# Patient Record
Sex: Female | Born: 1965 | Race: White | Hispanic: No | Smoking: Current every day smoker
Health system: Southern US, Community
[De-identification: ages and names within clinical notes are randomized; demographics above are authoritative.]

## PROBLEM LIST (undated history)

## (undated) DIAGNOSIS — G43909 Migraine, unspecified, not intractable, without status migrainosus: Secondary | ICD-10-CM

## (undated) DIAGNOSIS — D619 Aplastic anemia, unspecified: Secondary | ICD-10-CM

## (undated) DIAGNOSIS — R7401 Elevation of levels of liver transaminase levels: Secondary | ICD-10-CM

## (undated) DIAGNOSIS — D649 Anemia, unspecified: Secondary | ICD-10-CM

## (undated) DIAGNOSIS — Z87442 Personal history of urinary calculi: Secondary | ICD-10-CM

## (undated) DIAGNOSIS — R51 Headache: Secondary | ICD-10-CM

## (undated) DIAGNOSIS — T8069XA Other serum reaction due to other serum, initial encounter: Secondary | ICD-10-CM

## (undated) DIAGNOSIS — I82409 Acute embolism and thrombosis of unspecified deep veins of unspecified lower extremity: Secondary | ICD-10-CM

## (undated) DIAGNOSIS — I10 Essential (primary) hypertension: Secondary | ICD-10-CM

## (undated) DIAGNOSIS — J45909 Unspecified asthma, uncomplicated: Secondary | ICD-10-CM

## (undated) DIAGNOSIS — K219 Gastro-esophageal reflux disease without esophagitis: Secondary | ICD-10-CM

## (undated) DIAGNOSIS — R519 Headache, unspecified: Secondary | ICD-10-CM

## (undated) DIAGNOSIS — R740 Nonspecific elevation of levels of transaminase and lactic acid dehydrogenase [LDH]: Secondary | ICD-10-CM

## (undated) DIAGNOSIS — J189 Pneumonia, unspecified organism: Secondary | ICD-10-CM

## (undated) DIAGNOSIS — F419 Anxiety disorder, unspecified: Secondary | ICD-10-CM

## (undated) DIAGNOSIS — Z9289 Personal history of other medical treatment: Secondary | ICD-10-CM

## (undated) DIAGNOSIS — F329 Major depressive disorder, single episode, unspecified: Secondary | ICD-10-CM

## (undated) DIAGNOSIS — R011 Cardiac murmur, unspecified: Secondary | ICD-10-CM

## (undated) DIAGNOSIS — M1990 Unspecified osteoarthritis, unspecified site: Secondary | ICD-10-CM

## (undated) DIAGNOSIS — F32A Depression, unspecified: Secondary | ICD-10-CM

## (undated) DIAGNOSIS — J449 Chronic obstructive pulmonary disease, unspecified: Secondary | ICD-10-CM

## (undated) HISTORY — PX: TUBAL LIGATION: SHX77

## (undated) HISTORY — PX: CYSTOSCOPY W/ URETEROSCOPY W/ LITHOTRIPSY: SUR380

## (undated) HISTORY — PX: TUNNELED VENOUS CATHETER PLACEMENT: SHX818

---
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
## 1998-06-04 ENCOUNTER — Inpatient Hospital Stay (HOSPITAL_COMMUNITY): Admission: EM | Admit: 1998-06-04 | Discharge: 1998-06-05 | Payer: Self-pay | Admitting: Emergency Medicine

## 1998-06-07 ENCOUNTER — Emergency Department (HOSPITAL_COMMUNITY): Admission: EM | Admit: 1998-06-07 | Discharge: 1998-06-07 | Payer: Self-pay | Admitting: Emergency Medicine

## 1998-06-09 ENCOUNTER — Ambulatory Visit (HOSPITAL_COMMUNITY): Admission: RE | Admit: 1998-06-09 | Discharge: 1998-06-09 | Payer: Self-pay | Admitting: Hematology & Oncology

## 1998-06-11 ENCOUNTER — Encounter: Admission: RE | Admit: 1998-06-11 | Discharge: 1998-09-09 | Payer: Self-pay | Admitting: Hematology & Oncology

## 1998-06-16 ENCOUNTER — Encounter: Admission: RE | Admit: 1998-06-16 | Discharge: 1998-06-16 | Payer: Self-pay | Admitting: Obstetrics & Gynecology

## 1998-07-07 ENCOUNTER — Encounter: Admission: RE | Admit: 1998-07-07 | Discharge: 1998-07-07 | Payer: Self-pay | Admitting: Obstetrics & Gynecology

## 1998-07-08 ENCOUNTER — Inpatient Hospital Stay (HOSPITAL_COMMUNITY): Admission: AD | Admit: 1998-07-08 | Discharge: 1998-07-08 | Payer: Self-pay | Admitting: Obstetrics

## 1998-07-13 ENCOUNTER — Inpatient Hospital Stay: Admission: RE | Admit: 1998-07-13 | Discharge: 1998-07-18 | Payer: Self-pay | Admitting: Hematology & Oncology

## 1998-07-18 ENCOUNTER — Emergency Department (HOSPITAL_COMMUNITY): Admission: EM | Admit: 1998-07-18 | Discharge: 1998-07-18 | Payer: Self-pay | Admitting: Emergency Medicine

## 1998-07-19 ENCOUNTER — Inpatient Hospital Stay (HOSPITAL_COMMUNITY): Admission: AD | Admit: 1998-07-19 | Discharge: 1998-07-23 | Payer: Self-pay | Admitting: Hematology & Oncology

## 1998-07-24 ENCOUNTER — Ambulatory Visit (HOSPITAL_BASED_OUTPATIENT_CLINIC_OR_DEPARTMENT_OTHER): Admission: RE | Admit: 1998-07-24 | Discharge: 1998-07-24 | Payer: Self-pay | Admitting: Surgery

## 1998-07-27 ENCOUNTER — Emergency Department (HOSPITAL_COMMUNITY): Admission: EM | Admit: 1998-07-27 | Discharge: 1998-07-27 | Payer: Self-pay | Admitting: Emergency Medicine

## 1998-07-30 ENCOUNTER — Ambulatory Visit (HOSPITAL_COMMUNITY): Admission: RE | Admit: 1998-07-30 | Discharge: 1998-07-30 | Payer: Self-pay | Admitting: Hematology & Oncology

## 1998-08-12 ENCOUNTER — Inpatient Hospital Stay (HOSPITAL_COMMUNITY): Admission: AD | Admit: 1998-08-12 | Discharge: 1998-08-12 | Payer: Self-pay | Admitting: Obstetrics

## 1998-08-17 ENCOUNTER — Emergency Department (HOSPITAL_COMMUNITY): Admission: EM | Admit: 1998-08-17 | Discharge: 1998-08-17 | Payer: Self-pay | Admitting: Emergency Medicine

## 1998-09-02 ENCOUNTER — Ambulatory Visit (HOSPITAL_COMMUNITY): Admission: RE | Admit: 1998-09-02 | Discharge: 1998-09-02 | Payer: Self-pay | Admitting: Hematology & Oncology

## 1998-12-28 ENCOUNTER — Inpatient Hospital Stay (HOSPITAL_COMMUNITY): Admission: AD | Admit: 1998-12-28 | Discharge: 1998-12-28 | Payer: Self-pay | Admitting: *Deleted

## 1999-02-09 ENCOUNTER — Encounter: Admission: RE | Admit: 1999-02-09 | Discharge: 1999-02-09 | Payer: Self-pay | Admitting: Obstetrics & Gynecology

## 1999-02-18 ENCOUNTER — Emergency Department (HOSPITAL_COMMUNITY): Admission: EM | Admit: 1999-02-18 | Discharge: 1999-02-18 | Payer: Self-pay | Admitting: Emergency Medicine

## 1999-02-18 ENCOUNTER — Encounter: Payer: Self-pay | Admitting: Emergency Medicine

## 1999-02-23 ENCOUNTER — Encounter: Admission: RE | Admit: 1999-02-23 | Discharge: 1999-02-23 | Payer: Self-pay | Admitting: Obstetrics & Gynecology

## 1999-03-02 ENCOUNTER — Emergency Department (HOSPITAL_COMMUNITY): Admission: EM | Admit: 1999-03-02 | Discharge: 1999-03-02 | Payer: Self-pay | Admitting: Emergency Medicine

## 1999-03-04 ENCOUNTER — Emergency Department (HOSPITAL_COMMUNITY): Admission: EM | Admit: 1999-03-04 | Discharge: 1999-03-04 | Payer: Self-pay | Admitting: Emergency Medicine

## 1999-06-09 ENCOUNTER — Ambulatory Visit (HOSPITAL_COMMUNITY): Admission: RE | Admit: 1999-06-09 | Discharge: 1999-06-09 | Payer: Self-pay | Admitting: Family Medicine

## 1999-06-09 ENCOUNTER — Encounter: Payer: Self-pay | Admitting: Family Medicine

## 1999-07-08 ENCOUNTER — Emergency Department (HOSPITAL_COMMUNITY): Admission: EM | Admit: 1999-07-08 | Discharge: 1999-07-08 | Payer: Self-pay | Admitting: Emergency Medicine

## 1999-09-20 ENCOUNTER — Encounter: Payer: Self-pay | Admitting: Hematology & Oncology

## 1999-09-20 ENCOUNTER — Ambulatory Visit (HOSPITAL_COMMUNITY): Admission: RE | Admit: 1999-09-20 | Discharge: 1999-09-20 | Payer: Self-pay | Admitting: Hematology & Oncology

## 1999-10-21 ENCOUNTER — Ambulatory Visit (HOSPITAL_COMMUNITY): Admission: RE | Admit: 1999-10-21 | Discharge: 1999-10-21 | Payer: Self-pay | Admitting: Family Medicine

## 1999-10-21 ENCOUNTER — Encounter: Payer: Self-pay | Admitting: Family Medicine

## 1999-10-25 ENCOUNTER — Encounter: Payer: Self-pay | Admitting: Emergency Medicine

## 1999-10-25 ENCOUNTER — Emergency Department (HOSPITAL_COMMUNITY): Admission: EM | Admit: 1999-10-25 | Discharge: 1999-10-25 | Payer: Self-pay | Admitting: Emergency Medicine

## 1999-10-26 ENCOUNTER — Emergency Department (HOSPITAL_COMMUNITY): Admission: EM | Admit: 1999-10-26 | Discharge: 1999-10-26 | Payer: Self-pay | Admitting: Emergency Medicine

## 1999-10-27 ENCOUNTER — Emergency Department (HOSPITAL_COMMUNITY): Admission: EM | Admit: 1999-10-27 | Discharge: 1999-10-27 | Payer: Self-pay

## 1999-12-23 ENCOUNTER — Ambulatory Visit (HOSPITAL_COMMUNITY): Admission: RE | Admit: 1999-12-23 | Discharge: 1999-12-23 | Payer: Self-pay | Admitting: Hematology & Oncology

## 1999-12-23 ENCOUNTER — Encounter (INDEPENDENT_AMBULATORY_CARE_PROVIDER_SITE_OTHER): Payer: Self-pay | Admitting: *Deleted

## 2000-01-21 ENCOUNTER — Emergency Department (HOSPITAL_COMMUNITY): Admission: EM | Admit: 2000-01-21 | Discharge: 2000-01-21 | Payer: Self-pay | Admitting: Emergency Medicine

## 2000-01-21 ENCOUNTER — Encounter: Payer: Self-pay | Admitting: Oncology

## 2000-04-21 ENCOUNTER — Encounter: Admission: RE | Admit: 2000-04-21 | Discharge: 2000-04-21 | Payer: Self-pay | Admitting: Obstetrics & Gynecology

## 2000-05-30 ENCOUNTER — Encounter (INDEPENDENT_AMBULATORY_CARE_PROVIDER_SITE_OTHER): Payer: Self-pay | Admitting: Specialist

## 2000-05-30 ENCOUNTER — Ambulatory Visit (HOSPITAL_COMMUNITY): Admission: RE | Admit: 2000-05-30 | Discharge: 2000-05-30 | Payer: Self-pay | Admitting: Hematology & Oncology

## 2000-06-26 ENCOUNTER — Encounter: Payer: Self-pay | Admitting: Hematology & Oncology

## 2000-06-27 ENCOUNTER — Inpatient Hospital Stay (HOSPITAL_COMMUNITY): Admission: RE | Admit: 2000-06-27 | Discharge: 2000-07-05 | Payer: Self-pay | Admitting: Hematology & Oncology

## 2000-06-29 ENCOUNTER — Encounter: Payer: Self-pay | Admitting: Hematology & Oncology

## 2000-06-30 ENCOUNTER — Encounter: Payer: Self-pay | Admitting: Hematology & Oncology

## 2000-07-01 ENCOUNTER — Encounter: Payer: Self-pay | Admitting: Hematology & Oncology

## 2000-07-03 ENCOUNTER — Encounter: Payer: Self-pay | Admitting: Hematology & Oncology

## 2000-07-31 ENCOUNTER — Encounter: Payer: Self-pay | Admitting: Hematology & Oncology

## 2000-07-31 ENCOUNTER — Ambulatory Visit (HOSPITAL_COMMUNITY): Admission: RE | Admit: 2000-07-31 | Discharge: 2000-07-31 | Payer: Self-pay | Admitting: Hematology & Oncology

## 2000-09-05 ENCOUNTER — Ambulatory Visit (HOSPITAL_COMMUNITY): Admission: RE | Admit: 2000-09-05 | Discharge: 2000-09-05 | Payer: Self-pay | Admitting: Hematology & Oncology

## 2000-09-12 ENCOUNTER — Ambulatory Visit (HOSPITAL_COMMUNITY): Admission: RE | Admit: 2000-09-12 | Discharge: 2000-09-12 | Payer: Self-pay | Admitting: Hematology & Oncology

## 2000-09-12 ENCOUNTER — Encounter (INDEPENDENT_AMBULATORY_CARE_PROVIDER_SITE_OTHER): Payer: Self-pay | Admitting: *Deleted

## 2000-09-13 ENCOUNTER — Encounter: Payer: Self-pay | Admitting: Emergency Medicine

## 2000-09-13 ENCOUNTER — Emergency Department (HOSPITAL_COMMUNITY): Admission: EM | Admit: 2000-09-13 | Discharge: 2000-09-13 | Payer: Self-pay | Admitting: Emergency Medicine

## 2000-10-15 ENCOUNTER — Ambulatory Visit (HOSPITAL_COMMUNITY): Admission: RE | Admit: 2000-10-15 | Discharge: 2000-10-15 | Payer: Self-pay | Admitting: Urology

## 2000-10-15 ENCOUNTER — Encounter: Payer: Self-pay | Admitting: Emergency Medicine

## 2000-11-12 ENCOUNTER — Emergency Department (HOSPITAL_COMMUNITY): Admission: EM | Admit: 2000-11-12 | Discharge: 2000-11-12 | Payer: Self-pay | Admitting: Emergency Medicine

## 2001-09-23 ENCOUNTER — Ambulatory Visit (HOSPITAL_COMMUNITY): Admission: RE | Admit: 2001-09-23 | Discharge: 2001-09-23 | Payer: Self-pay | Admitting: Hematology & Oncology

## 2001-09-23 ENCOUNTER — Encounter: Payer: Self-pay | Admitting: Hematology & Oncology

## 2001-12-19 HISTORY — PX: TOTAL ABDOMINAL HYSTERECTOMY: SHX209

## 2002-01-09 ENCOUNTER — Encounter: Payer: Self-pay | Admitting: Hematology & Oncology

## 2002-01-09 ENCOUNTER — Ambulatory Visit (HOSPITAL_COMMUNITY): Admission: RE | Admit: 2002-01-09 | Discharge: 2002-01-09 | Payer: Self-pay | Admitting: Hematology & Oncology

## 2002-02-12 ENCOUNTER — Ambulatory Visit (HOSPITAL_COMMUNITY): Admission: RE | Admit: 2002-02-12 | Discharge: 2002-02-12 | Payer: Self-pay | Admitting: Hematology & Oncology

## 2002-02-12 ENCOUNTER — Encounter (INDEPENDENT_AMBULATORY_CARE_PROVIDER_SITE_OTHER): Payer: Self-pay | Admitting: Specialist

## 2002-03-04 ENCOUNTER — Encounter: Payer: Self-pay | Admitting: Hematology & Oncology

## 2002-03-04 ENCOUNTER — Inpatient Hospital Stay (HOSPITAL_COMMUNITY): Admission: AD | Admit: 2002-03-04 | Discharge: 2002-03-13 | Payer: Self-pay | Admitting: Hematology & Oncology

## 2002-03-11 ENCOUNTER — Encounter: Payer: Self-pay | Admitting: Hematology & Oncology

## 2002-03-12 ENCOUNTER — Encounter: Payer: Self-pay | Admitting: Hematology & Oncology

## 2002-03-27 ENCOUNTER — Other Ambulatory Visit: Admission: RE | Admit: 2002-03-27 | Discharge: 2002-03-27 | Payer: Self-pay | Admitting: Obstetrics and Gynecology

## 2002-04-07 ENCOUNTER — Emergency Department (HOSPITAL_COMMUNITY): Admission: EM | Admit: 2002-04-07 | Discharge: 2002-04-07 | Payer: Self-pay | Admitting: Emergency Medicine

## 2002-04-07 ENCOUNTER — Encounter: Payer: Self-pay | Admitting: Emergency Medicine

## 2002-08-22 ENCOUNTER — Encounter (INDEPENDENT_AMBULATORY_CARE_PROVIDER_SITE_OTHER): Payer: Self-pay | Admitting: Specialist

## 2002-08-22 ENCOUNTER — Inpatient Hospital Stay (HOSPITAL_COMMUNITY): Admission: RE | Admit: 2002-08-22 | Discharge: 2002-08-24 | Payer: Self-pay | Admitting: Obstetrics and Gynecology

## 2003-06-04 ENCOUNTER — Encounter: Payer: Self-pay | Admitting: Emergency Medicine

## 2003-06-04 ENCOUNTER — Emergency Department (HOSPITAL_COMMUNITY): Admission: EM | Admit: 2003-06-04 | Discharge: 2003-06-04 | Payer: Self-pay | Admitting: Emergency Medicine

## 2003-07-28 ENCOUNTER — Encounter: Payer: Self-pay | Admitting: Family Medicine

## 2003-07-28 ENCOUNTER — Ambulatory Visit (HOSPITAL_COMMUNITY): Admission: RE | Admit: 2003-07-28 | Discharge: 2003-07-28 | Payer: Self-pay | Admitting: Family Medicine

## 2003-08-29 ENCOUNTER — Ambulatory Visit: Admission: RE | Admit: 2003-08-29 | Discharge: 2003-08-29 | Payer: Self-pay | Admitting: Family Medicine

## 2003-12-20 HISTORY — PX: MEDIAL PARTIAL KNEE REPLACEMENT: SHX5965

## 2004-01-06 ENCOUNTER — Inpatient Hospital Stay (HOSPITAL_COMMUNITY): Admission: RE | Admit: 2004-01-06 | Discharge: 2004-01-09 | Payer: Self-pay | Admitting: Orthopedic Surgery

## 2004-01-13 ENCOUNTER — Emergency Department (HOSPITAL_COMMUNITY): Admission: EM | Admit: 2004-01-13 | Discharge: 2004-01-13 | Payer: Self-pay | Admitting: Emergency Medicine

## 2004-08-25 ENCOUNTER — Ambulatory Visit (HOSPITAL_BASED_OUTPATIENT_CLINIC_OR_DEPARTMENT_OTHER): Admission: RE | Admit: 2004-08-25 | Discharge: 2004-08-25 | Payer: Self-pay | Admitting: Orthopedic Surgery

## 2004-10-21 ENCOUNTER — Ambulatory Visit: Payer: Self-pay | Admitting: Hematology & Oncology

## 2005-01-20 ENCOUNTER — Ambulatory Visit: Payer: Self-pay | Admitting: Hematology & Oncology

## 2005-05-19 ENCOUNTER — Ambulatory Visit: Payer: Self-pay | Admitting: Hematology & Oncology

## 2005-12-13 ENCOUNTER — Ambulatory Visit: Payer: Self-pay | Admitting: Hematology & Oncology

## 2006-01-23 ENCOUNTER — Ambulatory Visit (HOSPITAL_COMMUNITY): Admission: RE | Admit: 2006-01-23 | Discharge: 2006-01-23 | Payer: Self-pay | Admitting: Hematology & Oncology

## 2006-04-12 ENCOUNTER — Ambulatory Visit: Payer: Self-pay | Admitting: Hematology & Oncology

## 2006-04-14 LAB — CBC WITH DIFFERENTIAL/PLATELET
BASO%: 0.3 % (ref 0.0–2.0)
Eosinophils Absolute: 0.2 10*3/uL (ref 0.0–0.5)
LYMPH%: 24.7 % (ref 14.0–48.0)
MCHC: 34.2 g/dL (ref 32.0–36.0)
MONO#: 0.4 10*3/uL (ref 0.1–0.9)
NEUT#: 3.3 10*3/uL (ref 1.5–6.5)
Platelets: 222 10*3/uL (ref 145–400)
RBC: 4.48 10*6/uL (ref 3.70–5.32)
RDW: 13.2 % (ref 11.3–14.5)
WBC: 5.1 10*3/uL (ref 3.9–10.0)

## 2006-04-14 LAB — CHCC SMEAR

## 2006-05-09 ENCOUNTER — Ambulatory Visit: Payer: Self-pay | Admitting: Family Medicine

## 2006-08-04 ENCOUNTER — Ambulatory Visit: Payer: Self-pay | Admitting: Hematology & Oncology

## 2006-08-05 ENCOUNTER — Emergency Department (HOSPITAL_COMMUNITY): Admission: EM | Admit: 2006-08-05 | Discharge: 2006-08-06 | Payer: Self-pay | Admitting: Emergency Medicine

## 2006-08-11 LAB — CBC WITH DIFFERENTIAL/PLATELET
Basophils Absolute: 0 10*3/uL (ref 0.0–0.1)
EOS%: 3 % (ref 0.0–7.0)
Eosinophils Absolute: 0.2 10*3/uL (ref 0.0–0.5)
HCT: 42.7 % (ref 34.8–46.6)
HGB: 14.6 g/dL (ref 11.6–15.9)
MCH: 34.3 pg — ABNORMAL HIGH (ref 26.0–34.0)
MONO#: 0.5 10*3/uL (ref 0.1–0.9)
NEUT#: 3 10*3/uL (ref 1.5–6.5)
NEUT%: 58.1 % (ref 39.6–76.8)
RDW: 14.4 % (ref 11.3–14.5)
WBC: 5.2 10*3/uL (ref 3.9–10.0)
lymph#: 1.5 10*3/uL (ref 0.9–3.3)

## 2006-08-11 LAB — CHCC SMEAR

## 2006-11-07 ENCOUNTER — Ambulatory Visit: Payer: Self-pay | Admitting: Hematology & Oncology

## 2006-11-07 ENCOUNTER — Emergency Department (HOSPITAL_COMMUNITY): Admission: EM | Admit: 2006-11-07 | Discharge: 2006-11-07 | Payer: Self-pay | Admitting: Family Medicine

## 2006-11-20 LAB — CBC WITH DIFFERENTIAL/PLATELET
BASO%: 1.6 % (ref 0.0–2.0)
Eosinophils Absolute: 0.1 10*3/uL (ref 0.0–0.5)
HCT: 41.4 % (ref 34.8–46.6)
HGB: 14.6 g/dL (ref 11.6–15.9)
MCH: 34.3 pg — ABNORMAL HIGH (ref 26.0–34.0)
MCV: 96.9 fL (ref 81.0–101.0)
MONO#: 0.6 10*3/uL (ref 0.1–0.9)
RDW: 11.5 % (ref 11.3–14.5)
lymph#: 1.7 10*3/uL (ref 0.9–3.3)

## 2006-11-20 LAB — CHCC SMEAR

## 2007-03-14 ENCOUNTER — Ambulatory Visit: Payer: Self-pay | Admitting: Hematology & Oncology

## 2007-03-19 LAB — CBC WITH DIFFERENTIAL/PLATELET
BASO%: 0.5 % (ref 0.0–2.0)
Basophils Absolute: 0 10*3/uL (ref 0.0–0.1)
EOS%: 3.8 % (ref 0.0–7.0)
HGB: 14.8 g/dL (ref 11.6–15.9)
MCH: 34.5 pg — ABNORMAL HIGH (ref 26.0–34.0)
MCHC: 34.8 g/dL (ref 32.0–36.0)
MONO#: 0.4 10*3/uL (ref 0.1–0.9)
RDW: 13.5 % (ref 11.3–14.5)
WBC: 5.2 10*3/uL (ref 3.9–10.0)
lymph#: 1.2 10*3/uL (ref 0.9–3.3)

## 2007-05-08 ENCOUNTER — Inpatient Hospital Stay (HOSPITAL_COMMUNITY): Admission: EM | Admit: 2007-05-08 | Discharge: 2007-05-09 | Payer: Self-pay | Admitting: Emergency Medicine

## 2007-05-08 ENCOUNTER — Ambulatory Visit: Payer: Self-pay | Admitting: Cardiology

## 2007-05-08 ENCOUNTER — Ambulatory Visit: Payer: Self-pay | Admitting: *Deleted

## 2007-05-18 ENCOUNTER — Ambulatory Visit: Payer: Self-pay | Admitting: Family Medicine

## 2007-05-28 ENCOUNTER — Emergency Department (HOSPITAL_COMMUNITY): Admission: EM | Admit: 2007-05-28 | Discharge: 2007-05-28 | Payer: Self-pay | Admitting: Emergency Medicine

## 2007-07-10 ENCOUNTER — Ambulatory Visit: Payer: Self-pay | Admitting: Hematology & Oncology

## 2007-07-13 LAB — CBC WITH DIFFERENTIAL/PLATELET
Eosinophils Absolute: 0.2 10*3/uL (ref 0.0–0.5)
HGB: 14.9 g/dL (ref 11.6–15.9)
MONO#: 0.5 10*3/uL (ref 0.1–0.9)
NEUT#: 2.7 10*3/uL (ref 1.5–6.5)
RBC: 4.27 10*6/uL (ref 3.70–5.32)
RDW: 13.5 % (ref 11.3–14.5)
WBC: 5.1 10*3/uL (ref 3.9–10.0)
lymph#: 1.6 10*3/uL (ref 0.9–3.3)

## 2007-07-18 ENCOUNTER — Ambulatory Visit (HOSPITAL_BASED_OUTPATIENT_CLINIC_OR_DEPARTMENT_OTHER): Admission: RE | Admit: 2007-07-18 | Discharge: 2007-07-18 | Payer: Self-pay | Admitting: Orthopedic Surgery

## 2007-12-24 ENCOUNTER — Ambulatory Visit: Payer: Self-pay | Admitting: Family Medicine

## 2007-12-31 ENCOUNTER — Ambulatory Visit (HOSPITAL_COMMUNITY): Admission: RE | Admit: 2007-12-31 | Discharge: 2007-12-31 | Payer: Self-pay | Admitting: Family Medicine

## 2008-01-02 ENCOUNTER — Ambulatory Visit: Payer: Self-pay | Admitting: Hematology & Oncology

## 2008-02-18 ENCOUNTER — Ambulatory Visit: Payer: Self-pay | Admitting: Hematology & Oncology

## 2008-02-20 LAB — CBC WITH DIFFERENTIAL/PLATELET
BASO%: 0.6 % (ref 0.0–2.0)
HCT: 45.9 % (ref 34.8–46.6)
LYMPH%: 38.5 % (ref 14.0–48.0)
MCHC: 34.4 g/dL (ref 32.0–36.0)
MONO#: 0.6 10*3/uL (ref 0.1–0.9)
NEUT%: 44.8 % (ref 39.6–76.8)
Platelets: 175 10*3/uL (ref 145–400)
WBC: 5.5 10*3/uL (ref 3.9–10.0)

## 2008-08-12 ENCOUNTER — Ambulatory Visit: Payer: Self-pay | Admitting: Hematology & Oncology

## 2008-08-18 ENCOUNTER — Ambulatory Visit (HOSPITAL_BASED_OUTPATIENT_CLINIC_OR_DEPARTMENT_OTHER): Admission: RE | Admit: 2008-08-18 | Discharge: 2008-08-18 | Payer: Self-pay | Admitting: Hematology & Oncology

## 2008-08-18 LAB — CBC WITH DIFFERENTIAL (CANCER CENTER ONLY)
BASO%: 0.7 % (ref 0.0–2.0)
Eosinophils Absolute: 0.2 10*3/uL (ref 0.0–0.5)
MONO#: 0.4 10*3/uL (ref 0.1–0.9)
MONO%: 8 % (ref 0.0–13.0)
NEUT#: 2.8 10*3/uL (ref 1.5–6.5)
Platelets: 163 10*3/uL (ref 145–400)
RBC: 4.23 10*6/uL (ref 3.70–5.32)
RDW: 10.5 % (ref 10.5–14.6)
WBC: 4.8 10*3/uL (ref 3.9–10.0)

## 2008-08-18 LAB — COMPREHENSIVE METABOLIC PANEL
Albumin: 4.5 g/dL (ref 3.5–5.2)
CO2: 25 mEq/L (ref 19–32)
Chloride: 103 mEq/L (ref 96–112)
Glucose, Bld: 83 mg/dL (ref 70–99)
Potassium: 4.2 mEq/L (ref 3.5–5.3)
Sodium: 138 mEq/L (ref 135–145)
Total Protein: 7.2 g/dL (ref 6.0–8.3)

## 2008-08-18 LAB — RETICULOCYTES (CHCC)
RBC.: 4.32 MIL/uL (ref 3.87–5.11)
Retic Ct Pct: 1.1 % (ref 0.4–3.1)

## 2009-02-04 ENCOUNTER — Observation Stay (HOSPITAL_COMMUNITY): Admission: EM | Admit: 2009-02-04 | Discharge: 2009-02-05 | Payer: Self-pay | Admitting: Emergency Medicine

## 2009-03-11 ENCOUNTER — Ambulatory Visit: Payer: Self-pay | Admitting: Hematology & Oncology

## 2009-03-12 LAB — CBC WITH DIFFERENTIAL (CANCER CENTER ONLY)
EOS%: 4.2 % (ref 0.0–7.0)
LYMPH%: 26.5 % (ref 14.0–48.0)
MCH: 33.7 pg (ref 26.0–34.0)
MCHC: 32.7 g/dL (ref 32.0–36.0)
MONO%: 6.5 % (ref 0.0–13.0)
NEUT#: 3.4 10*3/uL (ref 1.5–6.5)
Platelets: 200 10*3/uL (ref 145–400)
RDW: 11.3 % (ref 10.5–14.6)

## 2009-03-12 LAB — RETICULOCYTES (CHCC): ABS Retic: 50.9 10*3/uL (ref 19.0–186.0)

## 2009-08-27 ENCOUNTER — Ambulatory Visit: Payer: Self-pay | Admitting: Hematology & Oncology

## 2009-09-18 LAB — CBC WITH DIFFERENTIAL (CANCER CENTER ONLY)
BASO#: 0 10*3/uL (ref 0.0–0.2)
EOS%: 4.1 % (ref 0.0–7.0)
HGB: 15.3 g/dL (ref 11.6–15.9)
LYMPH%: 21.6 % (ref 14.0–48.0)
MCH: 35 pg — ABNORMAL HIGH (ref 26.0–34.0)
MCHC: 34.2 g/dL (ref 32.0–36.0)
MCV: 102 fL — ABNORMAL HIGH (ref 81–101)
MONO%: 7.3 % (ref 0.0–13.0)
NEUT#: 4 10*3/uL (ref 1.5–6.5)
NEUT%: 66.3 % (ref 39.6–80.0)

## 2009-09-18 LAB — CHCC SATELLITE - SMEAR

## 2010-04-06 ENCOUNTER — Ambulatory Visit: Payer: Self-pay | Admitting: Hematology & Oncology

## 2010-04-19 LAB — COMPREHENSIVE METABOLIC PANEL
Alkaline Phosphatase: 72 U/L (ref 39–117)
BUN: 6 mg/dL (ref 6–23)
CO2: 21 mEq/L (ref 19–32)
Creatinine, Ser: 0.64 mg/dL (ref 0.40–1.20)
Glucose, Bld: 71 mg/dL (ref 70–99)
Sodium: 139 mEq/L (ref 135–145)
Total Bilirubin: 0.5 mg/dL (ref 0.3–1.2)
Total Protein: 7.2 g/dL (ref 6.0–8.3)

## 2010-04-19 LAB — CBC WITH DIFFERENTIAL (CANCER CENTER ONLY)
BASO#: 0.1 10*3/uL (ref 0.0–0.2)
EOS%: 2.3 % (ref 0.0–7.0)
Eosinophils Absolute: 0.2 10*3/uL (ref 0.0–0.5)
HGB: 16 g/dL — ABNORMAL HIGH (ref 11.6–15.9)
LYMPH%: 17.6 % (ref 14.0–48.0)
MCH: 34.4 pg — ABNORMAL HIGH (ref 26.0–34.0)
MCHC: 33.3 g/dL (ref 32.0–36.0)
MCV: 103 fL — ABNORMAL HIGH (ref 81–101)
MONO%: 6.6 % (ref 0.0–13.0)
NEUT#: 4.5 10*3/uL (ref 1.5–6.5)

## 2010-04-19 LAB — VITAMIN D 25 HYDROXY (VIT D DEFICIENCY, FRACTURES): Vit D, 25-Hydroxy: 23 ng/mL — ABNORMAL LOW (ref 30–89)

## 2010-06-02 ENCOUNTER — Ambulatory Visit: Payer: Self-pay | Admitting: Hematology & Oncology

## 2010-07-23 ENCOUNTER — Ambulatory Visit: Payer: Self-pay | Admitting: Hematology & Oncology

## 2010-07-26 LAB — CBC WITH DIFFERENTIAL (CANCER CENTER ONLY)
BASO#: 0.1 10*3/uL (ref 0.0–0.2)
BASO%: 1.1 % (ref 0.0–2.0)
EOS%: 4.2 % (ref 0.0–7.0)
Eosinophils Absolute: 0.3 10*3/uL (ref 0.0–0.5)
HCT: 44.8 % (ref 34.8–46.6)
HGB: 15.4 g/dL (ref 11.6–15.9)
LYMPH#: 1.6 10*3/uL (ref 0.9–3.3)
LYMPH%: 26.7 % (ref 14.0–48.0)
MCH: 35.3 pg — ABNORMAL HIGH (ref 26.0–34.0)
MCHC: 34.3 g/dL (ref 32.0–36.0)
MCV: 103 fL — ABNORMAL HIGH (ref 81–101)
MONO#: 0.4 10*3/uL (ref 0.1–0.9)
MONO%: 7.1 % (ref 0.0–13.0)
NEUT#: 3.6 10*3/uL (ref 1.5–6.5)
NEUT%: 60.9 % (ref 39.6–80.0)
Platelets: 167 10*3/uL (ref 145–400)
RBC: 4.35 10*6/uL (ref 3.70–5.32)
RDW: 10 % — ABNORMAL LOW (ref 10.5–14.6)
WBC: 5.9 10*3/uL (ref 3.9–10.0)

## 2010-07-26 LAB — CHCC SATELLITE - SMEAR

## 2010-07-27 LAB — RETICULOCYTES (CHCC): Retic Ct Pct: 1.5 % (ref 0.4–3.1)

## 2010-07-27 LAB — COMPREHENSIVE METABOLIC PANEL
ALT: 22 U/L (ref 0–35)
AST: 33 U/L (ref 0–37)
CO2: 25 mEq/L (ref 19–32)
Creatinine, Ser: 0.66 mg/dL (ref 0.40–1.20)
Total Bilirubin: 0.3 mg/dL (ref 0.3–1.2)

## 2010-09-14 ENCOUNTER — Ambulatory Visit: Payer: Self-pay | Admitting: Hematology & Oncology

## 2010-11-11 ENCOUNTER — Emergency Department (HOSPITAL_COMMUNITY): Admission: EM | Admit: 2010-11-11 | Discharge: 2010-11-11 | Payer: Self-pay | Admitting: Emergency Medicine

## 2010-12-15 ENCOUNTER — Ambulatory Visit: Payer: Self-pay | Admitting: Hematology & Oncology

## 2010-12-24 LAB — CBC WITH DIFFERENTIAL (CANCER CENTER ONLY)
BASO#: 0.1 10*3/uL (ref 0.0–0.2)
BASO%: 1 % (ref 0.0–2.0)
EOS%: 5.2 % (ref 0.0–7.0)
Eosinophils Absolute: 0.3 10*3/uL (ref 0.0–0.5)
HCT: 44.4 % (ref 34.8–46.6)
HGB: 15 g/dL (ref 11.6–15.9)
LYMPH#: 1.7 10*3/uL (ref 0.9–3.3)
LYMPH%: 34.3 % (ref 14.0–48.0)
MCH: 34.7 pg — ABNORMAL HIGH (ref 26.0–34.0)
MCHC: 33.8 g/dL (ref 32.0–36.0)
MCV: 103 fL — ABNORMAL HIGH (ref 81–101)
MONO#: 0.5 10*3/uL (ref 0.1–0.9)
MONO%: 10.1 % (ref 0.0–13.0)
NEUT#: 2.4 10*3/uL (ref 1.5–6.5)
NEUT%: 49.4 % (ref 39.6–80.0)
Platelets: 187 10*3/uL (ref 145–400)
RBC: 4.32 10*6/uL (ref 3.70–5.32)
RDW: 11.2 % (ref 10.5–14.6)
WBC: 4.9 10*3/uL (ref 3.9–10.0)

## 2010-12-24 LAB — BASIC METABOLIC PANEL - CANCER CENTER ONLY
BUN, Bld: 5 mg/dL — ABNORMAL LOW (ref 7–22)
CO2: 27 mEq/L (ref 18–33)
Calcium: 9.2 mg/dL (ref 8.0–10.3)
Chloride: 103 mEq/L (ref 98–108)
Creat: 0.4 mg/dl — ABNORMAL LOW (ref 0.6–1.2)
Glucose, Bld: 92 mg/dL (ref 73–118)
Potassium: 4.3 mEq/L (ref 3.3–4.7)
Sodium: 144 mEq/L (ref 128–145)

## 2011-01-09 ENCOUNTER — Encounter: Payer: Self-pay | Admitting: Hematology & Oncology

## 2011-01-09 ENCOUNTER — Encounter: Payer: Self-pay | Admitting: Family Medicine

## 2011-02-27 ENCOUNTER — Encounter (HOSPITAL_COMMUNITY): Payer: Self-pay | Admitting: Radiology

## 2011-02-27 ENCOUNTER — Emergency Department (HOSPITAL_COMMUNITY): Payer: Medicare Other

## 2011-02-27 ENCOUNTER — Emergency Department (HOSPITAL_COMMUNITY)
Admission: EM | Admit: 2011-02-27 | Discharge: 2011-02-27 | Disposition: A | Discharge status: Home / Self Care | Payer: Medicare Other | Attending: Emergency Medicine | Admitting: Emergency Medicine

## 2011-02-27 DIAGNOSIS — I10 Essential (primary) hypertension: Secondary | ICD-10-CM | POA: Insufficient documentation

## 2011-02-27 DIAGNOSIS — R0609 Other forms of dyspnea: Secondary | ICD-10-CM | POA: Insufficient documentation

## 2011-02-27 DIAGNOSIS — R0989 Other specified symptoms and signs involving the circulatory and respiratory systems: Secondary | ICD-10-CM | POA: Insufficient documentation

## 2011-02-27 DIAGNOSIS — J449 Chronic obstructive pulmonary disease, unspecified: Secondary | ICD-10-CM | POA: Insufficient documentation

## 2011-02-27 DIAGNOSIS — J4489 Other specified chronic obstructive pulmonary disease: Secondary | ICD-10-CM | POA: Insufficient documentation

## 2011-02-27 DIAGNOSIS — R000 Tachycardia, unspecified: Secondary | ICD-10-CM | POA: Insufficient documentation

## 2011-02-27 DIAGNOSIS — R072 Precordial pain: Secondary | ICD-10-CM | POA: Insufficient documentation

## 2011-02-27 DIAGNOSIS — R0602 Shortness of breath: Secondary | ICD-10-CM | POA: Insufficient documentation

## 2011-02-27 DIAGNOSIS — R112 Nausea with vomiting, unspecified: Secondary | ICD-10-CM | POA: Insufficient documentation

## 2011-02-27 DIAGNOSIS — F411 Generalized anxiety disorder: Secondary | ICD-10-CM | POA: Insufficient documentation

## 2011-02-27 DIAGNOSIS — J189 Pneumonia, unspecified organism: Secondary | ICD-10-CM | POA: Insufficient documentation

## 2011-02-27 HISTORY — DX: Essential (primary) hypertension: I10

## 2011-02-27 LAB — CBC
HCT: 42.3 % (ref 36.0–46.0)
Hemoglobin: 15 g/dL (ref 12.0–15.0)
MCH: 34.1 pg — ABNORMAL HIGH (ref 26.0–34.0)
MCV: 96.1 fL (ref 78.0–100.0)
Platelets: 125 10*3/uL — ABNORMAL LOW (ref 150–400)
RBC: 4.4 MIL/uL (ref 3.87–5.11)
WBC: 5.8 10*3/uL (ref 4.0–10.5)

## 2011-02-27 LAB — DIFFERENTIAL
Lymphocytes Relative: 33 % (ref 12–46)
Lymphs Abs: 1.9 10*3/uL (ref 0.7–4.0)
Monocytes Relative: 11 % (ref 3–12)
Neutro Abs: 3 10*3/uL (ref 1.7–7.7)
Neutrophils Relative %: 52 % (ref 43–77)

## 2011-02-27 LAB — BASIC METABOLIC PANEL
Calcium: 8.8 mg/dL (ref 8.4–10.5)
GFR calc Af Amer: >60 mL/min (ref >60)
GFR calc non Af Amer: >60 mL/min (ref >60)
Glucose, Bld: 88 mg/dL (ref 70–99)
Potassium: 3.5 mEq/L (ref 3.5–5.1)
Sodium: 133 mEq/L — ABNORMAL LOW (ref 135–145)

## 2011-02-27 MED ORDER — IOHEXOL 300 MG/ML  SOLN
100.0000 mL | Freq: Once | INTRAMUSCULAR | Status: AC | PRN
  Administered 2011-02-27: 100 mL via INTRAVENOUS

## 2011-03-01 LAB — DIFFERENTIAL
Basophils Absolute: 0 K/uL (ref 0.0–0.1)
Basophils Relative: 1 % (ref 0–1)
Eosinophils Absolute: 0.2 10*3/uL (ref 0.0–0.7)
Eosinophils Relative: 3 % (ref 0–5)
Lymphocytes Relative: 36 % (ref 12–46)
Lymphs Abs: 2.4 10*3/uL (ref 0.7–4.0)
Monocytes Absolute: 0.5 10*3/uL (ref 0.1–1.0)
Monocytes Relative: 8 % (ref 3–12)
Neutro Abs: 3.6 K/uL (ref 1.7–7.7)
Neutrophils Relative %: 53 % (ref 43–77)

## 2011-03-01 LAB — HEPATIC FUNCTION PANEL
ALT: 22 U/L (ref 0–35)
AST: 32 U/L (ref 0–37)
Albumin: 3.5 g/dL (ref 3.5–5.2)
Alkaline Phosphatase: 61 U/L (ref 39–117)
Bilirubin, Direct: 0.1 mg/dL (ref 0.0–0.3)
Indirect Bilirubin: 0.2 mg/dL — ABNORMAL LOW (ref 0.3–0.9)
Total Bilirubin: 0.3 mg/dL (ref 0.3–1.2)
Total Protein: 6.9 g/dL (ref 6.0–8.3)

## 2011-03-01 LAB — POCT PREGNANCY, URINE: Preg Test, Ur: NEGATIVE

## 2011-03-01 LAB — URINE MICROSCOPIC-ADD ON

## 2011-03-01 LAB — CBC
HCT: 40.1 % (ref 36.0–46.0)
Hemoglobin: 13.9 g/dL (ref 12.0–15.0)
MCH: 35.1 pg — ABNORMAL HIGH (ref 26.0–34.0)
MCHC: 34.7 g/dL (ref 30.0–36.0)
MCV: 101 fL — ABNORMAL HIGH (ref 78.0–100.0)
Platelets: 220 K/uL (ref 150–400)
RBC: 3.97 MIL/uL (ref 3.87–5.11)
RDW: 13.7 % (ref 11.5–15.5)
WBC: 6.8 10*3/uL (ref 4.0–10.5)

## 2011-03-01 LAB — URINALYSIS, ROUTINE W REFLEX MICROSCOPIC
Bilirubin Urine: NEGATIVE
Glucose, UA: NEGATIVE mg/dL
Hgb urine dipstick: NEGATIVE
Ketones, ur: NEGATIVE mg/dL
Nitrite: NEGATIVE
Protein, ur: NEGATIVE mg/dL
Specific Gravity, Urine: 1.025 (ref 1.005–1.030)
Urobilinogen, UA: 0.2 mg/dL (ref 0.0–1.0)
pH: 5 (ref 5.0–8.0)

## 2011-03-01 LAB — POCT I-STAT, CHEM 8
BUN: 6 mg/dL (ref 6–23)
Calcium, Ion: 1.08 mmol/L — ABNORMAL LOW (ref 1.12–1.32)
Chloride: 108 meq/L (ref 96–112)
Creatinine, Ser: 0.8 mg/dL (ref 0.4–1.2)
Glucose, Bld: 78 mg/dL (ref 70–99)
HCT: 43 % (ref 36.0–46.0)
Hemoglobin: 14.6 g/dL (ref 12.0–15.0)
Potassium: 3.7 meq/L (ref 3.5–5.1)
Sodium: 144 meq/L (ref 135–145)
TCO2: 26 mmol/L (ref 0–100)

## 2011-03-01 LAB — LIPASE, BLOOD: Lipase: 29 U/L (ref 11–59)

## 2011-04-05 LAB — BASIC METABOLIC PANEL
BUN: 8 mg/dL (ref 6–23)
Calcium: 8.8 mg/dL (ref 8.4–10.5)
Chloride: 103 mEq/L (ref 96–112)
Creatinine, Ser: 0.66 mg/dL (ref 0.4–1.2)
GFR calc non Af Amer: >60 mL/min (ref >60)

## 2011-04-05 LAB — CBC
MCV: 99 fL (ref 78.0–100.0)
Platelets: 113 10*3/uL — ABNORMAL LOW (ref 150–400)
WBC: 9.3 10*3/uL (ref 4.0–10.5)

## 2011-04-05 LAB — DIFFERENTIAL
Eosinophils Absolute: 0 10*3/uL (ref 0.0–0.7)
Lymphs Abs: 0.7 10*3/uL (ref 0.7–4.0)
Neutro Abs: 8 10*3/uL — ABNORMAL HIGH (ref 1.7–7.7)
Neutrophils Relative %: 86 % — ABNORMAL HIGH (ref 43–77)

## 2011-05-03 NOTE — Op Note (Signed)
[name], Wendy Edwards                ACCOUNT NO.:  [id number]   MEDICAL RECORD NO.:  [id number]          PATIENT TYPE:  AMB   LOCATION:  DSC                          FACILITY:  [facility]   PHYSICIAN:  Cindee Salt, M.D.       DATE OF BIRTH:  June 09, 1966   DATE OF PROCEDURE:  07/18/2007  DATE OF DISCHARGE:                               OPERATIVE REPORT   PREOPERATIVE DIAGNOSIS:  Ulnar neuropathy, right elbow with dislocating  ulnar nerve.   POSTOPERATIVE DIAGNOSIS:  Ulnar neuropathy, right elbow with dislocating  ulnar nerve.   OPERATION:  Subcutaneous transposition, right ulnar nerve.   SURGEON:  Cindee Salt, M.D.   ASSISTANT:  Carolyne Fiscal R.N.   ANESTHESIA:  General.   HISTORY:  The patient is a 45 year old female with a history of ulnar  neuropathy of right elbow, EMG nerve conductions positive.  She shows a  dislocating ulnar nerve.  This has not responded to conservative  treatment.  She is desirous of proceeding to have this surgically  released.  Pre, peri and postoperative course is discussed along with  risks and complications.  She is aware there is no guarantee with the  surgery, possibility of infection, recurrence, injury to arteries,  nerves, tendons, incomplete relief of symptoms, dystrophy.  In the  preoperative area the extremity is marked by both the patient and  surgeon.  Antibiotic given.   PROCEDURE:  The patient brought to the operating room where a general  anesthetic was carried out without difficulty.  She was prepped using  DuraPrep, supine position, right arm free.  The limb was exsanguinated  with an Esmarch bandage.  Tourniquet placed high on the arm was inflated  to 250 mmHg.  A longitudinal incision was made over the medial side of  the elbow, carried down through subcutaneous tissue.  Bleeders were  electrocauterized.  The dissection carried down, the posterior branch of  medial antebrachial cutaneous nerve of the forearm was identified and  protected.  The  ulnar nerve was identified.  The dislocation was  immediately apparent.  A release was then performed fasciotomy of flexor  carpi ulnaris tendon.  The dissection carried proximally, releasing the  arcade of Struthers and medial intermuscular septum was then resected  protecting neurovascular structures.  This was left attached to the  epicondyle.  A segment of fascia of the flexor carpi ulnaris was then  elevated, left attached to the epicondyle, forming two slings, the nerve  was then transposed anteriorly with its blood vessels.  The wound was  irrigated.  Bleeders again electrocauterized.  The slings were then  created and sutured to the subcutaneous tissue with figure-of-eight 4-0  Vicryl sutures.  The wound was again irrigated.  Subcutaneous tissue  closed with interrupted 4-0 Vicryl and skin with a subcuticular of 3-0  Vicryl Rapide suture.  Sterile compressive dressing long-  arm splint was applied after placement of Benzoin and Steri-Strips for  further protection to the closure.  On deflation of the tourniquet all  fingers immediately pinked.  She was taken to the recovery room  observation in satisfactory  condition.  She is discharged home to return  to the [facility] [place] in one week on Vicodin.           ______________________________  Cindee Salt, M.D.     GK/MEDQ  D:  07/18/2007  T:  07/19/2007  Job:  [id number]

## 2011-05-03 NOTE — Discharge Summary (Signed)
Wendy Edwards, Wendy Edwards NO.:  [id number]   MEDICAL RECORD NO.:  [id number]          PATIENT TYPE:  INP   LOCATION:  4729                         FACILITY:  [facility]   PHYSICIAN:  Yvonne Kendall, M.D.  DATE OF BIRTH:  03-04-1966   DATE OF ADMISSION:  05/08/2007  DATE OF DISCHARGE:  05/09/2007                               DISCHARGE SUMMARY   DISCHARGE DIAGNOSES:  1. Medical noncompliance, as patient left against medical advice.  2. Atypical chest pain.  3. Obstructive lung disease with history of asthma and chronic      obstructive pulmonary disease.  4. Asymptomatic transaminitis.  5. Alcohol abuse.  6. Tobacco abuse.  7. History of aplastic anemia followed by Dr. Myna Hidalgo.  8. History of upper and lower extremity DVTs.  9. History of serum sickness.  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  11.History of menorrhagia status post total abdominal hysterectomy in      2003.  12.History of migraine headaches.   DISCHARGE MEDICATIONS:  None, as the patient eloped from the hospital.   DISPOSITION AND FOLLOWUP:  Shortly after noontime on May 09, 2007, the  patient's room was found to be empty.  She had disconnected herself from  the IV pole and was nowhere to be found.  Numerous attempts by hospital  security to locate the patient were unsuccessful.  Finally she was able  to be contacted by telephone, and reported that she had left on her own  because she was frustrated with the lack of attention that she had  received in the hospital.  She denied having any active chest pains and  felt like she was in her normal state of health.  Of note, the patient's  telemetry monitor could not be located, though she denied leaving the  premises with it.  The patient was instructed to return to Dr. Audria Nine  at [facility] as soon as possible, for further evaluation.  At that  time, an EKG  should be repeated to monitor her QT prolongation.  Also,  the patient should be set up for an outpatient stress test for risk  stratification of her coronary artery disease.   PROCEDURES PERFORMED:  2-D echocardiogram.  This was performed on May 09, 2007 and revealed normal left ventricular systolic function with an  ejection fraction of 60%.  No evidence of left ventricular regional wall  motion abnormalities were seen.   CONSULTATIONS:  None.   BRIEF ADMITTING HISTORY AND PHYSICAL:  The patient is a 45 year old  female with a history of aplastic anemia, serum sickness, and prior DVT,  who presented to the emergency department with worsening substernal  chest pain radiating to her back that began on the morning prior to  admission.  She reported that she was watching television when the pain  began.  Since that time, it has fluctuated in intensity and occasionally  is related to exertion.  She is unable to describe the quality of the  pain, but rates it's intensity at  4/10.  She denies having any shortness  of breath, fevers, chills, nausea or vomiting, diarrhea, constipation or  urinary symptoms.  However, she was briefly diaphoretic when this  episode first began.  She has a history of DVTs in her right lower  extremity and was treated with Coumadin for some period of time.  The  patient later remarked that she had experienced similar transient  episodes of chest pain that were not provoked by exertion and generally  resolved spontaneously after 10-15 minutes.   PHYSICAL EXAMINATION:  VITAL SIGNS:  Temperature 98.1.  Blood pressure  144/84.  Pulse 111.  Respirations 24.  Oxygen saturation 97% on room  air.  GENERAL:  The patient is a well-developed, well-nourished female in no  acute distress.  HEENT:  Pupils are equal, round, and reactive to light and  accommodation.  Extraocular eye movements are intact.  The patient has  moist mucous membranes and a clear oropharynx.  NECK:   Supple, without lymphadenopathy or thyromegaly.  RESPIRATORY:  Patient has good air movement, with rare scattered  wheezes.  CARDIOVASCULAR:  The patient is tachycardic, but has a regular rhythm  without murmurs, rubs or gallops.  ABDOMEN:  Normoactive bowel sounds are present.  The abdomen is soft,  nontender and nondistended, without hepatomegaly.  EXTREMITIES:  No clubbing, cyanosis or edema noted.  The patient has 2+  radial, posterior tibial, and dorsalis pedis pulses bilaterally.  SKIN:  No rashes or lesions are seen.  LYMPH:  No cervical or supraclavicular lymphadenopathy present.  NEURO:  Cranial nerves are grossly intact.  The remainder of the exam is  nonfocal.  PSYCH:  The patient is alert and oriented.  Her mood and affect are  appropriate.   ADMISSION LABORATORIES:  White blood cell count 6.2, hemoglobin 14.8,  hematocrit 43.6, platelets 142, ANC 4.6, MCV 101.1, sodium 136,  potassium 3.7, chloride 106, bicarbonate 23, BUN 6, creatinine 0.6,  glucose 87, anion gap 7.  Point of care markers negative.  D-dimer 0.29.  Urinalysis negative.   CHEST X-RAY:  No active cardiopulmonary disease is seen.   ELECTROCARDIOGRAM:  Sinus tachycardia with biatrial enlargement.  Questionable T wave inversion is seen in lead III.  Pseudonormalization  of T waves is seen in leads V1 and V2, compared to the prior study.  Additionally, her partial right bundle-branch noted in January of 2005  has resolved.   HOSPITAL COURSE:  1. Atypical chest pain.  Based on the patient's presenting complaints,      we felt this was consistent with atypical chest pain.  She has      several risk factors for coronary artery disease including a      positive family history and ongoing tobacco abuse.  Additionally,      the nonspecific ST changes noted on her EKG required further      evaluation.  The patient was admitted to the telemetry floor, where     and EKG was repeated and serial cardiac enzymes  obtained.  These      remained within normal limits.  Also, because of the patient's      longstanding alcohol abuse and abnormal EKG findings, including      biatrial enlargement, a 2-D echocardiogram was ordered.  As noted      above, this was found to be within normal limits.  The patient was      to remain in the hospital for further observation on telemetry  because she also had a prolonged Q-T.  Though she did not      demonstrate any significant arrhythmias, we felt that further      observation would be beneficial.  Additionally, it was felt that      the patient should undergo outpatient stress testing.  However, as      noted above, the patient eloped from the floor prior to re-      evaluation.  She was contacted at home, and based on the findings      above, it was felt that she could follow up with her primary care      physician for further evaluation and management.  2. Obstructive lung disease.  The patient reports a previous diagnosis      of asthma, but also uses Combivent, which suggests the presence of      COPD.  This is further supported by her 15 pack-year smoking      history.  Initially the patient was asymptomatic, but developed 1      episode of transient shortness of breath following admission.  She      was started on as-needed Combivent, with relief of her symptoms.      Outpatient PFT's are recommended if they have not been done.  3. Transaminitis.  The patient denied a history of liver disease.      However, on admission, her AST and ALT were elevated at 65 and 66,      respectively.  Most likely this is due to ongoing alcohol abuse and      concomitant fatty liver disease.  However, an acute hepatitis panel      was ordered and is pending at this time.  Since the patient was      asymptomatic, further evaluation was not pursued.  If she develops      right upper quadrant pain or other signs or symptoms of hepatic      disease, an abdominal ultrasound  is recommended.  Routine followup      of her liver function tests should be performed as an outpatient.  4. Polysubstance abuse.  The patient reported a history of      considerable alcohol use, including drinking a 12-pack of beer      every other day.  She also smokes 1 pack per day, and has been      doing so for the last 15 years.  Given the patient's long and heavy      alcohol use, she was started on thiamine and folic acid.  She was      also monitored on the CIWA protocol for signs or symptoms of      withdrawal.  She did not exhibit any of these during the      hospitalization.  It should be noted that on admission, her MCV was      elevated at 101.1.  Subsequent vitamin B12 and serum folate levels      were obtained, and her B12 was in the lower range of normal, at      253.  A methylmalonic acid level should be obtained when the      patient returns for followup to screen for vitamin B12 deficiency.     The patient was encouraged to refrain from using alcohol and also      to stop smoking.  Smoking cessation counseling was ordered, but      could not be performed prior to the  patient's disappearance.  5. History of aplastic anemia.  The patient has a history of aplastic      anemia diagnosed in 1999.  She was most recently hospitalized in      2003 and underwent treatment with antithymocyte globulin,      cyclosporin, and high-dose steroids.  Since that time, she has not      had any further episodes but continues to follow up with Dr.      Myna Hidalgo.  On admission her cell counts were found to be      appropriate.  The patient is to follow up with Dr. Myna Hidalgo as      previously scheduled.   DISCHARGE LABORATORIES:  Sodium 138, potassium 3.6, chloride 100,  bicarbonate 29, BUN 9, creatinine 0.8, glucose  129, calcium 9.4.  Total  cholesterol 177, triglycerides 96, HDL 84, LDL 74, TSH 0.74, vitamin Z61  of 253, serum folate 11.0, total bilirubin 0.7, alkaline phosphatase 46,   AST 65, ALT 66, total protein 61, albumin 3.5.   DISCHARGE VITALS:  Temperature 98.3.  Blood pressure 107/76.  Pulse 80.  Respirations 20.  Oxygen saturation 95% on room air.      Yvonne Kendall, M.D.  Electronically Signed     CE/MEDQ  D:  05/09/2007  T:  05/10/2007  Job:  [id number]   cc:   Maurice March, M.D.  Rose Phi. Myna Hidalgo, M.D.

## 2011-05-06 NOTE — H&P (Signed)
[name], Edwards                            ACCOUNT NO.:  [id number]   MEDICAL RECORD NO.:  [id number]                   PATIENT TYPE:   LOCATION:                                       FACILITY:   PHYSICIAN:  Daniel L. Eda Paschal, M.D.           DATE OF BIRTH:   DATE OF ADMISSION:  DATE OF DISCHARGE:                                HISTORY & PHYSICAL   CHIEF COMPLAINT:  Menometorrhagia.   HISTORY OF PRESENT ILLNESS:  The patient is a 45 year old Gravida III, Para  III, who has been treated by Dr. Arlan Organ for aplastic anemia. She has  had remissions but has also had recurrence of her problem. What has happened  is that ever since she has had her aplastic anemia, she will have severe  menometorrhagia. Her period will sometimes come twice a month and can last  up to 16 days and as a result of that, she has required hospitalizations and  recurrent blood transfusions by Dr. Myna Hidalgo for control for the above. She  is not a candidate for injectable medications because of her aplastic  anemia. She has not responded to oral Prednisone in the past and as a result  of this, an abnormal sonohistogram. She underwent endometrial ablation with  hopes that this would control the above. Although she has had some  improvement of the above, it is not completely controlled it. Dr. Myna Hidalgo  has been continually concerned because of her menometorrhagia and it has  affected her aplastic anemia and her need for transfusion and he has felt  that since she did not benefit enough from endometrial ablation, that she  should proceed with a hysterectomy. At the moment, she is in remission from  her aplastic anemia. She will enter the hospital for a vaginal hysterectomy.  We are going to conserve her ovaries.   PAST MEDICAL HISTORY:  The patient has aplastic anemia. She also has a  history of asthma.   MEDICATIONS:  1. Paxil, Combivent, and she has been on Prednisone in the past.   ALLERGIES:  No  known drug allergies.   FAMILY HISTORY:  Father has coronary artery disease. Grandfather is  diabetic. Father and brother have hypertension. Grandmother has had colon  cancer. Mother and grandmother have had uterine cancer.   REVIEW OF SYSTEMS:  HEENT: History of headaches. Lightheadedness and  bleeding gums possibly secondary to her aplastic anemia. Cardiovascular  reveals history of shortness of breath, again possibly related to her  aplastic anemia. Respiratory reveal history of asthma. Gastrointestinal  reveals history of gastroesophageal reflux disease. Neuromuscular reveals  history of joint pains and cramping. GU reveals history of kidney stones in  the past. Neurological psychiatric reveals history of anxiety and  depression.   PHYSICAL EXAMINATION:  GENERAL: A well developed, well nourished  female in  no acute distress.  VITAL SIGNS: Blood pressure is 118/70, pulse 80 and regular, respiratory  rate 16 and non labored. She is afebrile.  HEENT: Within normal limits.  NECK: Supple. Trachea midline. Thyroid not enlarged.  LUNGS: Clear to auscultation and percussion.  HEART: No thrills or heaves or murmurs.  BREAST: No masses.  ABDOMEN: Soft without guarding, rebound, or masses.  PELVIC: External and vaginal within normal limits. Cervix clean. PAP smear  shows no atypia. Uterus is retroverted. Normal size and shape. Adnexa within  normal limits.  RECTAL: Negative.   IMPRESSION:  Menometorrhagia worsened by aplastic anemia.   PLAN:  Vaginal hysterectomy for treatment of the above.                                               Daniel L. Eda Paschal, M.D.    Wendy Edwards  D:  08/22/2002  T:  08/22/2002  Job:  [id number]   cc:   Rose Phi. Myna Hidalgo, M.D.  [address] [facility]  [place], [place] [address]  Fax: [phone]

## 2011-05-06 NOTE — Procedures (Signed)
[facility]  Patient:    Wendy Edwards, Wendy Edwards Visit Number: [id number] MRN: [id number]          Service Type: OUT Location: OMED Attending Physician:  Jim Desanctis Dictated by:   Rose Phi. Myna Hidalgo, M.D. Proc. Date: 02/12/02 Admit Date:  02/12/2002                             Procedure Report  PROCEDURE:  Left posterior iliac crest bone marrow biopsy and aspirate.  DESCRIPTION OF PROCEDURE:  [name] [facility]  She had an IV placed.  She subsequently was onto her right side.  She received Versed 10 mg and Demerol 100 mg IV for sedation.  We then prepped and draped the left posterior iliac crest region in a sterile fashion.  Then 10 cc of 2% lidocaine were infiltrated under the skin and down to the periosteum.  A #11 scalpel was used to make an incision into the skin.  Two bone marrow aspirates were obtained without difficulties.  A second incision was made into the skin.  A good bone marrow biopsy core was obtained without difficulties.  There was no bleeding during the procedure.  [name] was comfortable throughout the procedure.  All of her vital signs were stable. Dictated by:   Rose Phi. Myna Hidalgo, M.D. Attending Physician:  Jim Desanctis DD:  02/12/02 TD:  02/12/02 Job: [id number]

## 2011-06-20 ENCOUNTER — Other Ambulatory Visit: Payer: Self-pay | Admitting: Hematology & Oncology

## 2011-06-20 ENCOUNTER — Encounter (HOSPITAL_BASED_OUTPATIENT_CLINIC_OR_DEPARTMENT_OTHER): Payer: Medicare Other | Admitting: Hematology & Oncology

## 2011-06-20 DIAGNOSIS — D619 Aplastic anemia, unspecified: Secondary | ICD-10-CM

## 2011-06-20 DIAGNOSIS — I10 Essential (primary) hypertension: Secondary | ICD-10-CM

## 2011-06-20 LAB — RETICULOCYTES (CHCC)
RBC.: 4.2 MIL/uL (ref 3.87–5.11)
Retic Ct Pct: 1.6 % (ref 0.4–2.3)

## 2011-06-20 LAB — CBC WITH DIFFERENTIAL (CANCER CENTER ONLY)
BASO%: 1 % (ref 0.0–2.0)
LYMPH%: 41.6 % (ref 14.0–48.0)
MCH: 34.9 pg — ABNORMAL HIGH (ref 26.0–34.0)
MCV: 98 fL (ref 81–101)
MONO#: 0.5 10*3/uL (ref 0.1–0.9)
MONO%: 11.8 % (ref 0.0–13.0)
Platelets: 164 10*3/uL (ref 145–400)
RDW: 12.3 % (ref 11.1–15.7)
WBC: 4.2 10*3/uL (ref 3.9–10.0)

## 2011-10-03 LAB — BASIC METABOLIC PANEL
CO2: 25
Calcium: 8.9
Chloride: 106
Creatinine, Ser: 0.76
GFR calc Af Amer: >60
Sodium: 138

## 2011-10-03 LAB — POCT HEMOGLOBIN-HEMACUE: Hemoglobin: 15.8 — ABNORMAL HIGH

## 2011-10-06 LAB — DIFFERENTIAL
Basophils Absolute: 0
Basophils Relative: 1
Eosinophils Absolute: 0
Neutrophils Relative %: 76

## 2011-10-06 LAB — CBC
MCHC: 34.1
MCV: 99.7
Platelets: 142 — ABNORMAL LOW

## 2011-10-06 LAB — PROTIME-INR: Prothrombin Time: 13.4

## 2011-10-06 LAB — BASIC METABOLIC PANEL
BUN: 7
CO2: 23
Calcium: 9.5
Chloride: 104
Creatinine, Ser: 0.71

## 2011-10-06 LAB — D-DIMER, QUANTITATIVE: D-Dimer, Quant: 0.28

## 2011-11-09 ENCOUNTER — Other Ambulatory Visit: Payer: Self-pay | Admitting: *Deleted

## 2011-11-09 MED ORDER — ALPRAZOLAM 1 MG PO TABS: 1.0000 mg | ORAL_TABLET | Freq: Three times a day (TID) | ORAL | Status: DC | PRN

## 2011-12-29 ENCOUNTER — Ambulatory Visit: Payer: Medicare Other | Admitting: Hematology & Oncology

## 2011-12-29 ENCOUNTER — Telehealth: Payer: Self-pay | Admitting: Hematology & Oncology

## 2011-12-29 ENCOUNTER — Other Ambulatory Visit: Payer: Medicare Other | Admitting: Lab

## 2011-12-29 NOTE — Telephone Encounter (Signed)
Pt called and cx apt for today 12/29/11 due to going to the hospital for kidney stones.  Nurse was notified of cx apt.  She stated she will call back to resch

## 2011-12-30 ENCOUNTER — Telehealth: Payer: Self-pay | Admitting: Hematology & Oncology

## 2011-12-30 NOTE — Telephone Encounter (Addendum)
Pt called back and cx 01/02/12 apt due to transportation issues and resch for 01/17/12

## 2011-12-30 NOTE — Telephone Encounter (Addendum)
Pt called and cx 12/20/11 apt and resch for 01/02/12

## 2012-01-02 ENCOUNTER — Ambulatory Visit: Payer: Medicare Other | Admitting: Hematology & Oncology

## 2012-01-02 ENCOUNTER — Other Ambulatory Visit: Payer: Medicare Other | Admitting: Lab

## 2012-01-06 ENCOUNTER — Other Ambulatory Visit: Payer: Self-pay | Admitting: *Deleted

## 2012-01-06 DIAGNOSIS — F419 Anxiety disorder, unspecified: Secondary | ICD-10-CM

## 2012-01-06 MED ORDER — ALPRAZOLAM 1 MG PO TABS: 1.0000 mg | ORAL_TABLET | Freq: Three times a day (TID) | ORAL | Status: DC | PRN

## 2012-01-06 NOTE — Telephone Encounter (Signed)
Received a refill request for Xanax from [facility]. Refilled for 30 pills only as she has r/s'd her f/u appt 3 times.

## 2012-01-17 ENCOUNTER — Ambulatory Visit (HOSPITAL_BASED_OUTPATIENT_CLINIC_OR_DEPARTMENT_OTHER): Payer: Medicare Other | Admitting: Hematology & Oncology

## 2012-01-17 ENCOUNTER — Other Ambulatory Visit (HOSPITAL_BASED_OUTPATIENT_CLINIC_OR_DEPARTMENT_OTHER): Payer: Medicare Other | Admitting: Lab

## 2012-01-17 VITALS — BP 152/94 | HR 111 | Temp 98.4°F | Ht 63.0 in | Wt 142.0 lb

## 2012-01-17 DIAGNOSIS — D619 Aplastic anemia, unspecified: Secondary | ICD-10-CM

## 2012-01-17 DIAGNOSIS — F419 Anxiety disorder, unspecified: Secondary | ICD-10-CM

## 2012-01-17 LAB — CBC WITH DIFFERENTIAL (CANCER CENTER ONLY)
BASO#: 0 10*3/uL (ref 0.0–0.2)
Eosinophils Absolute: 0.3 10*3/uL (ref 0.0–0.5)
HCT: 44.5 % (ref 34.8–46.6)
HGB: 15.3 g/dL (ref 11.6–15.9)
LYMPH%: 22.1 % (ref 14.0–48.0)
MCH: 34.3 pg — ABNORMAL HIGH (ref 26.0–34.0)
MCV: 100 fL (ref 81–101)
MONO%: 11.6 % (ref 0.0–13.0)
NEUT#: 4.1 10*3/uL (ref 1.5–6.5)
RBC: 4.46 10*6/uL (ref 3.70–5.32)

## 2012-01-17 LAB — RETICULOCYTES (CHCC): RBC.: 4.51 MIL/uL (ref 3.87–5.11)

## 2012-01-17 MED ORDER — ALPRAZOLAM 1 MG PO TABS: 1.0000 mg | ORAL_TABLET | Freq: Three times a day (TID) | ORAL | Status: DC | PRN

## 2012-01-17 MED ORDER — ALBUTEROL SULFATE (2.5 MG/3ML) 0.083% IN NEBU: 2.5000 mg | INHALATION_SOLUTION | Freq: Two times a day (BID) | RESPIRATORY_TRACT | Status: DC

## 2012-01-17 NOTE — Progress Notes (Signed)
.  dict
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
## 2012-02-10 ENCOUNTER — Other Ambulatory Visit: Payer: Self-pay | Admitting: *Deleted

## 2012-02-10 DIAGNOSIS — F419 Anxiety disorder, unspecified: Secondary | ICD-10-CM

## 2012-02-10 MED ORDER — ALPRAZOLAM 1 MG PO TABS: 1.0000 mg | ORAL_TABLET | Freq: Three times a day (TID) | ORAL | Status: DC | PRN

## 2012-02-13 ENCOUNTER — Other Ambulatory Visit: Payer: Self-pay | Admitting: Hematology & Oncology

## 2012-02-13 ENCOUNTER — Other Ambulatory Visit: Payer: Self-pay | Admitting: *Deleted

## 2012-02-13 DIAGNOSIS — J439 Emphysema, unspecified: Secondary | ICD-10-CM

## 2012-02-13 MED ORDER — ALBUTEROL SULFATE HFA 108 (90 BASE) MCG/ACT IN AERS: 2.0000 | INHALATION_SPRAY | Freq: Four times a day (QID) | RESPIRATORY_TRACT | Status: DC | PRN

## 2012-03-07 ENCOUNTER — Other Ambulatory Visit: Payer: Self-pay | Admitting: *Deleted

## 2012-03-07 DIAGNOSIS — F419 Anxiety disorder, unspecified: Secondary | ICD-10-CM

## 2012-03-07 MED ORDER — ALPRAZOLAM 1 MG PO TABS: 1.0000 mg | ORAL_TABLET | Freq: Three times a day (TID) | ORAL | Status: DC | PRN

## 2012-03-08 ENCOUNTER — Other Ambulatory Visit: Payer: Self-pay | Admitting: *Deleted

## 2012-03-08 DIAGNOSIS — F419 Anxiety disorder, unspecified: Secondary | ICD-10-CM

## 2012-03-08 MED ORDER — ALPRAZOLAM 1 MG PO TABS: 1.0000 mg | ORAL_TABLET | Freq: Three times a day (TID) | ORAL | Status: DC | PRN

## 2012-04-13 ENCOUNTER — Encounter (HOSPITAL_COMMUNITY): Payer: Self-pay | Admitting: *Deleted

## 2012-04-13 ENCOUNTER — Emergency Department (HOSPITAL_COMMUNITY): Payer: Medicare Other

## 2012-04-13 ENCOUNTER — Emergency Department (HOSPITAL_COMMUNITY)
Admission: EM | Admit: 2012-04-13 | Discharge: 2012-04-13 | Disposition: A | Discharge status: Home / Self Care | Payer: Medicare Other | Attending: Emergency Medicine | Admitting: Emergency Medicine

## 2012-04-13 DIAGNOSIS — R911 Solitary pulmonary nodule: Secondary | ICD-10-CM

## 2012-04-13 DIAGNOSIS — I2510 Atherosclerotic heart disease of native coronary artery without angina pectoris: Secondary | ICD-10-CM

## 2012-04-13 DIAGNOSIS — R079 Chest pain, unspecified: Secondary | ICD-10-CM | POA: Insufficient documentation

## 2012-04-13 DIAGNOSIS — K449 Diaphragmatic hernia without obstruction or gangrene: Secondary | ICD-10-CM

## 2012-04-13 LAB — CBC
Hemoglobin: 14.6 g/dL (ref 12.0–15.0)
MCH: 34.3 pg — ABNORMAL HIGH (ref 26.0–34.0)
RBC: 4.26 MIL/uL (ref 3.87–5.11)

## 2012-04-13 LAB — POCT I-STAT, CHEM 8
BUN: 6 mg/dL (ref 6–23)
Calcium, Ion: 1.09 mmol/L — ABNORMAL LOW (ref 1.12–1.32)
Chloride: 108 mEq/L (ref 96–112)

## 2012-04-13 LAB — POCT I-STAT TROPONIN I: Troponin i, poc: 0 ng/mL (ref 0.00–0.08)

## 2012-04-13 LAB — D-DIMER, QUANTITATIVE: D-Dimer, Quant: 0.45 ug/mL-FEU (ref 0.00–0.48)

## 2012-04-13 MED ORDER — ONDANSETRON HCL 4 MG/2ML IJ SOLN
4.0000 mg | Freq: Once | INTRAMUSCULAR | Status: AC
  Administered 2012-04-13: 4 mg via INTRAVENOUS
  Filled 2012-04-13: qty 2

## 2012-04-13 MED ORDER — NITROGLYCERIN 0.4 MG SL SUBL
0.4000 mg | SUBLINGUAL_TABLET | SUBLINGUAL | Status: DC | PRN
  Administered 2012-04-13 (×2): 0.4 mg via SUBLINGUAL
  Filled 2012-04-13: qty 25

## 2012-04-13 MED ORDER — PANTOPRAZOLE SODIUM 20 MG PO TBEC: 20.0000 mg | DELAYED_RELEASE_TABLET | Freq: Every day | ORAL | Status: DC

## 2012-04-13 MED ORDER — PANTOPRAZOLE SODIUM 40 MG IV SOLR
40.0000 mg | Freq: Once | INTRAVENOUS | Status: AC
  Administered 2012-04-13: 40 mg via INTRAVENOUS
  Filled 2012-04-13: qty 40

## 2012-04-13 MED ORDER — MORPHINE SULFATE 4 MG/ML IJ SOLN
4.0000 mg | Freq: Once | INTRAMUSCULAR | Status: AC
  Administered 2012-04-13: 4 mg via INTRAVENOUS
  Filled 2012-04-13: qty 1

## 2012-04-13 MED ORDER — HYDROMORPHONE HCL PF 1 MG/ML IJ SOLN
1.0000 mg | Freq: Once | INTRAMUSCULAR | Status: AC
  Administered 2012-04-13: 1 mg via INTRAVENOUS
  Filled 2012-04-13: qty 1

## 2012-04-13 MED ORDER — IOHEXOL 350 MG/ML SOLN
80.0000 mL | Freq: Once | INTRAVENOUS | Status: AC | PRN
  Administered 2012-04-13: 80 mL via INTRAVENOUS

## 2012-04-13 MED ORDER — GI COCKTAIL ~~LOC~~
30.0000 mL | Freq: Once | ORAL | Status: AC
  Administered 2012-04-13: 30 mL via ORAL
  Filled 2012-04-13: qty 30

## 2012-04-13 MED ORDER — FAMOTIDINE 20 MG PO TABS: 20.0000 mg | ORAL_TABLET | Freq: Two times a day (BID) | ORAL | Status: DC

## 2012-04-13 MED ORDER — SODIUM CHLORIDE 0.9 % IV BOLUS (SEPSIS)
1000.0000 mL | Freq: Once | INTRAVENOUS | Status: AC
  Administered 2012-04-13: 1000 mL via INTRAVENOUS

## 2012-04-13 NOTE — ED Provider Notes (Signed)
History     CSN: [id number]  Arrival date & time 04/13/12  0023   First MD Initiated Contact with Patient 04/13/12 0025      Chief Complaint  Patient presents with  . Chest Pain    (Consider location/radiation/quality/duration/timing/severity/associated sxs/prior treatment) HPI History provided by the patient. EMS called tonight for substernal chest pain sharp in quality and not radiating. Patient is a smoker and drinks beer on a regular basis and takes Motrin regularly. She has had some increased belching but denies any sour taste in her mouth or heartburn otherwise. No history of similar chest pains. No cough or shortness of breath. No leg pain or leg swelling. No history of DVT or PE. Patient has remote history of aplastic anemia and is followed by Dr. Myna Hidalgo for the same. No fevers or chills. No nausea vomiting or diarrhea. No trauma. No rash. No history of similar symptoms in the past and symptoms are moderate in severity. No known aggravating or alleviating factors. Patient given aspirin and nitroglycerin in route per EMS Past Medical History  Diagnosis Date  . Hypertension     No past surgical history on file.  No family history on file.  History  Substance Use Topics  . Smoking status: Not on file  . Smokeless tobacco: Not on file  . Alcohol Use: Not on file    OB History    Grav Para Term Preterm Abortions TAB SAB Ect Mult Living                  Review of Systems  Constitutional: Negative for fever and chills.  HENT: Negative for neck pain and neck stiffness.   Eyes: Negative for pain.  Respiratory: Negative for shortness of breath.   Cardiovascular: Positive for chest pain.  Gastrointestinal: Negative for abdominal pain.  Genitourinary: Negative for dysuria.  Musculoskeletal: Negative for back pain.  Skin: Negative for rash.  Neurological: Negative for headaches.  All other systems reviewed and are negative.    Allergies  Review of patient's  allergies indicates no known allergies.  Home Medications   Current Outpatient Rx  Name Route Sig Dispense Refill  . ALBUTEROL SULFATE HFA 108 (90 BASE) MCG/ACT IN AERS Inhalation Inhale 2 puffs into the lungs every 6 (six) hours as needed for shortness of breath. 1 Inhaler 4  . ALPRAZOLAM 1 MG PO TABS Oral Take 1 tablet (1 mg total) by mouth 3 (three) times daily as needed for anxiety. 90 tablet 3    BP 114/77  Pulse 115  Temp(Src) 98.2 F (36.8 C) (Oral)  Resp 19  SpO2 92%  Physical Exam  Constitutional: She is oriented to person, place, and time. She appears well-developed and well-nourished.  HENT:  Head: Normocephalic and atraumatic.  Eyes: Conjunctivae and EOM are normal. Pupils are equal, round, and reactive to light.  Neck: Trachea normal. Neck supple. No thyromegaly present.  Cardiovascular: Normal rate, regular rhythm, S1 normal, S2 normal and normal pulses.     No systolic murmur is present   No diastolic murmur is present  Pulses:      Radial pulses are 2+ on the right side, and 2+ on the left side.  Pulmonary/Chest: Effort normal and breath sounds normal. She has no wheezes. She has no rhonchi. She has no rales. She exhibits no tenderness.  Abdominal: Soft. Normal appearance and bowel sounds are normal. There is no tenderness. There is no CVA tenderness and negative Murphy's sign.  Musculoskeletal:  BLE:s Calves nontender, no cords or erythema, negative Homans sign  Neurological: She is alert and oriented to person, place, and time. She has normal strength. No cranial nerve deficit or sensory deficit. GCS eye subscore is 4. GCS verbal subscore is 5. GCS motor subscore is 6.  Skin: Skin is warm and dry. No rash noted. She is not diaphoretic.  Psychiatric: Her speech is normal.       Cooperative and appropriate    ED Course  Procedures (including critical care time)  Labs Reviewed  CBC - Abnormal; Notable for the following:    MCH 34.3 (*)    All other  components within normal limits  POCT I-STAT, CHEM 8 - Abnormal; Notable for the following:    Calcium, Ion 1.09 (*)    Hemoglobin 15.3 (*)    All other components within normal limits  D-DIMER, QUANTITATIVE  POCT I-STAT TROPONIN I  POCT I-STAT TROPONIN I   Ct Angio Chest W/cm &/or Wo Cm  04/13/2012  *RADIOLOGY REPORT*  Clinical Data: Mid chest pain worsening since last night.  CT ANGIOGRAPHY CHEST  Technique:  Multidetector CT imaging of the chest using the standard protocol during bolus administration of intravenous contrast. Multiplanar reconstructed images including MIPs were obtained and reviewed to evaluate the vascular anatomy.  Contrast: 80mL OMNIPAQUE IOHEXOL 350 MG/ML SOLN  Comparison: 02/27/2011  Findings: Technically adequate study with good opacification of the central and segmental pulmonary arteries.  No focal filling defects demonstrated.  No evidence of significant pulmonary embolus. Normal heart size.  Coronary artery calcifications.  Normal caliber thoracic aorta with mild calcification.  Small esophageal hiatal hernia with decompression of the remainder the esophagus.  Inferior to the right pulmonary veins, there is a 1.2 cm diameter low attenuation lesion consistent with a mass or lymph node.  This is enlarged since the previous study.  This could represent inflammatory lymph node versus neoplastic process.  Followup in 3-6 months is recommended.  No pleural effusions.  Patchy emphysematous changes in the lungs.  Mild scarring in the apices.  Small subpleural nodules are demonstrated without change.  No focal airspace consolidation in the lungs.  Visualized portions of the upper abdominal organs are grossly unremarkable.  Transverse colon extends anterior to the liver.  IMPRESSION: No evidence of significant pulmonary embolus.  Coronary artery calcification.  1.2 cm diameter nodule inferior to the right pulmonary veins is enlarging since previous study and may represent enlarged lymph  node or parenchymal nodule.  Follow-up recommended. Small esophageal hiatal hernia.  Original Report Authenticated By: Marlon Pel, M.D.   Dg Chest Portable 1 View  04/13/2012  *RADIOLOGY REPORT*  Clinical Data: Chest pain for 3 hours.  History of asthma and hypertension.  PORTABLE CHEST - 1 VIEW  Comparison: 02/27/2011  Findings: Normal heart size and pulmonary vascularity.  No focal airspace consolidation in the lungs.  No blunting of costophrenic angles.  No pneumothorax.  No significant change since previous study.  IMPRESSION: No evidence of active pulmonary disease.  Original Report Authenticated By: Marlon Pel, M.D.      Date: 04/13/2012  Rate: 116  Rhythm: sinus tachycardia  QRS Axis: left  Intervals: normal  ST/T Wave abnormalities: nonspecific ST changes  Conduction Disutrbances:none  Narrative Interpretation:   Old EKG Reviewed: unchanged  Patient given morphine with intermittent relief of pain. Given persistent tachycardia, CT scan obtained and reviewed as above. No PE. Noted hiatal hernia and GI cocktail provided which did improve pain. IV Protonix  and on further history patient is very sensitive to spicy foods.    MDM   Chest pain with workup as above. Tachycardia improving with IV fluids and pain control. Serial cardiac enzymes negative. Plan outpatient followup with primary care physician for stress test and further evaluation symptoms. Prescription for Pepcid Protonix provided with GERD precautions and chest pain precautions verbalized as understood. Patient aware of CT findings as above and agrees to followup appointment nodule and coronary calcification        Sunnie Nielsen, MD 04/13/12 [phone]

## 2012-04-13 NOTE — ED Notes (Signed)
Patient here via EMS with chest pressure, pinky finger and ring finger on the left hand became numb, increase BP 140/106 20 gauge in the Right FA  Given 324 ASA and 2 NTG SL  154/102 last pressure 112 pulse

## 2012-04-13 NOTE — Discharge Instructions (Signed)
Chest Pain   Call your doctor today to schedule outpatient stress test as discussed. You also need followup for pulmonary nodules discussed. Start medications as prescribed. Return here or be evaluated immediately for any worsening condition or symptoms. Avoid spicy foods, anti-inflammatory medication such as Motrin Advil and Aleve and avoid alcohol and tobacco.  It is often hard to give a specific diagnosis for the cause of chest pain. There is always a chance that your pain could be related to something serious, such as a heart attack or a blood clot in the lungs. You need to follow up with your caregiver for further evaluation. CAUSES   Heartburn.   Pneumonia or bronchitis.   Anxiety or stress.   Inflammation around your heart (pericarditis) or lung (pleuritis or pleurisy).   A blood clot in the lung.   A collapsed lung (pneumothorax). It can develop suddenly on its own (spontaneous pneumothorax) or from injury (trauma) to the chest.   Shingles infection (herpes zoster virus).  The chest wall is composed of bones, muscles, and cartilage. Any of these can be the source of the pain.  The bones can be bruised by injury.   The muscles or cartilage can be strained by coughing or overwork.   The cartilage can be affected by inflammation and become sore (costochondritis).  DIAGNOSIS  Lab tests or other studies, such as X-rays, electrocardiography, stress testing, or cardiac imaging, may be needed to find the cause of your pain.  TREATMENT   Treatment depends on what may be causing your chest pain. Treatment may include:   Acid blockers for heartburn.   Anti-inflammatory medicine.   Pain medicine for inflammatory conditions.   Antibiotics if an infection is present.   You may be advised to change lifestyle habits. This includes stopping smoking and avoiding alcohol, caffeine, and chocolate.   You may be advised to keep your head raised (elevated) when sleeping. This reduces the  chance of acid going backward from your stomach into your esophagus.   Most of the time, nonspecific chest pain will improve within 2 to 3 days with rest and mild pain medicine.  HOME CARE INSTRUCTIONS   If antibiotics were prescribed, take your antibiotics as directed. Finish them even if you start to feel better.   For the next few days, avoid physical activities that bring on chest pain. Continue physical activities as directed.   Do not smoke.   Avoid drinking alcohol.   Only take over-the-counter or prescription medicine for pain, discomfort, or fever as directed by your caregiver.   Follow your caregiver's suggestions for further testing if your chest pain does not go away.   Keep any follow-up appointments you made. If you do not go to an appointment, you could develop lasting (chronic) problems with pain. If there is any problem keeping an appointment, you must call to reschedule.  SEEK MEDICAL CARE IF:   You think you are having problems from the medicine you are taking. Read your medicine instructions carefully.   Your chest pain does not go away, even after treatment.   You develop a rash with blisters on your chest.  SEEK IMMEDIATE MEDICAL CARE IF:   You have increased chest pain or pain that spreads to your arm, neck, jaw, back, or abdomen.   You develop shortness of breath, an increasing cough, or you are coughing up blood.   You have severe back or abdominal pain, feel nauseous, or vomit.   You develop severe weakness, fainting,  or chills.   You have a fever.  THIS IS AN EMERGENCY. Do not wait to see if the pain will go away. Get medical help at once. Call your local emergency services (911 in [place] Do not drive yourself to the hospital. MAKE SURE YOU:   Understand these instructions.   Will watch your condition.   Will get help right away if you are not doing well or get worse.

## 2012-05-18 ENCOUNTER — Other Ambulatory Visit: Payer: Self-pay | Admitting: *Deleted

## 2012-05-18 ENCOUNTER — Ambulatory Visit (INDEPENDENT_AMBULATORY_CARE_PROVIDER_SITE_OTHER): Payer: Medicare Other | Admitting: Family Medicine

## 2012-05-18 VITALS — BP 125/79 | HR 99 | Temp 98.7°F | Resp 18 | Ht 64.0 in | Wt 151.0 lb

## 2012-05-18 DIAGNOSIS — R0789 Other chest pain: Secondary | ICD-10-CM | POA: Insufficient documentation

## 2012-05-18 DIAGNOSIS — R911 Solitary pulmonary nodule: Secondary | ICD-10-CM

## 2012-05-18 DIAGNOSIS — Z720 Tobacco use: Secondary | ICD-10-CM | POA: Insufficient documentation

## 2012-05-18 DIAGNOSIS — K449 Diaphragmatic hernia without obstruction or gangrene: Secondary | ICD-10-CM

## 2012-05-18 DIAGNOSIS — K219 Gastro-esophageal reflux disease without esophagitis: Secondary | ICD-10-CM

## 2012-05-18 DIAGNOSIS — F17200 Nicotine dependence, unspecified, uncomplicated: Secondary | ICD-10-CM

## 2012-05-18 MED ORDER — OMEPRAZOLE 40 MG PO CPDR: 40.0000 mg | DELAYED_RELEASE_CAPSULE | Freq: Every day | ORAL | Status: DC

## 2012-05-18 NOTE — Progress Notes (Signed)
Subjective: 46 year old lady who went to the emergency room a month ago with chest pain. She was evaluated extensively. Workup was negative except for the CT scan of the heart and lungs. This revealed no pulmonary emboli, some coronary artery calcifications, and a very small nodule. She is back today for further assessment. She is continued to have problems with chest pains. She walks a couple of miles, but does not get chest pain from the walking. However at other times she has substernal and low substernal chest pain. Sometimes she awakens with it in the morning. She does not get relief with Tylenol. She does occasionally use something like BCs to help headaches or other pains. Denies having a lot of anxiety.  She has a history of aplastic anemia and sees Dr. Arlan Organ twice yearly for that. Her next appointment is the 15th. [facility]es [facility] when [facility]. She takes a walk. Spends a lot of her time crocheting. She does smoke about one half pack of cigarettes a day  Objective: Somewhat overweight white female no acute distress this morning. Throat is clear. Neck supple without significant nodes. Chest clear to auscultation. Heart regular without murmurs. Abdomen soft without masses or tenderness.  Assessment: Intermittent chest pains, etiology undetermined. Possibly reflux. Small hiatal hernia Pulmonary nodule Coronary artery calcifications History of aplastic anemia

## 2012-05-18 NOTE — Progress Notes (Signed)
Addended by: Maryann Alar on: 05/18/2012 12:13 PM   Modules accepted: Orders

## 2012-05-18 NOTE — Patient Instructions (Addendum)
Stop smoking per our discussion  Gastroesophageal Reflux Disease, Adult Gastroesophageal reflux disease (GERD) happens when acid from your stomach flows up into the esophagus. When acid comes in contact with the esophagus, the acid causes soreness (inflammation) in the esophagus. Over time, GERD may create small holes (ulcers) in the lining of the esophagus. CAUSES   Increased body weight. This puts pressure on the stomach, making acid rise from the stomach into the esophagus.   Smoking. This increases acid production in the stomach.   Drinking alcohol. This causes decreased pressure in the lower esophageal sphincter (valve or ring of muscle between the esophagus and stomach), allowing acid from the stomach into the esophagus.   Late evening meals and a full stomach. This increases pressure and acid production in the stomach.   A malformed lower esophageal sphincter.  Sometimes, no cause is found. SYMPTOMS   Burning pain in the lower part of the mid-chest behind the breastbone and in the mid-stomach area. This may occur twice a week or more often.   Trouble swallowing.   Sore throat.   Dry cough.   Asthma-like symptoms including chest tightness, shortness of breath, or wheezing.  DIAGNOSIS  Your caregiver may be able to diagnose GERD based on your symptoms. In some cases, X-rays and other tests may be done to check for complications or to check the condition of your stomach and esophagus. TREATMENT  Your caregiver may recommend over-the-counter or prescription medicines to help decrease acid production. Ask your caregiver before starting or adding any new medicines.  HOME CARE INSTRUCTIONS   Change the factors that you can control. Ask your caregiver for guidance concerning weight loss, quitting smoking, and alcohol consumption.   Avoid foods and drinks that make your symptoms worse, such as:   Caffeine or alcoholic drinks.   Chocolate.   Peppermint or mint flavorings.    Garlic and onions.   Spicy foods.   Citrus fruits, such as oranges, lemons, or limes.   Tomato-based foods such as sauce, chili, salsa, and pizza.   Fried and fatty foods.   Avoid lying down for the 3 hours prior to your bedtime or prior to taking a nap.   Eat small, frequent meals instead of large meals.   Wear loose-fitting clothing. Do not wear anything tight around your waist that causes pressure on your stomach.   Raise the head of your bed 6 to 8 inches with wood blocks to help you sleep. Extra pillows will not help.   Only take over-the-counter or prescription medicines for pain, discomfort, or fever as directed by your caregiver.   Do not take aspirin, ibuprofen, or other nonsteroidal anti-inflammatory drugs (NSAIDs).  SEEK IMMEDIATE MEDICAL CARE IF:   You have pain in your arms, neck, jaw, teeth, or back.   Your pain increases or changes in intensity or duration.   You develop nausea, vomiting, or sweating (diaphoresis).   You develop shortness of breath, or you faint.   Your vomit is green, yellow, black, or looks like coffee grounds or blood.   Your stool is red, bloody, or black.  These symptoms could be signs of other problems, such as heart disease, gastric bleeding, or esophageal bleeding. MAKE SURE YOU:   Understand these instructions.   Will watch your condition.   Will get help right away if you are not doing well or get worse.  Document Released: 09/14/2005 Document Revised: 11/24/2011 Document Reviewed: 06/24/2011 [facility] Patient Information 2012 [place], [place].   Referrals have  been made to a lung specialist and her cardiologist. If you do not hear from [place] on him please call us back midweek.

## 2012-05-18 NOTE — Progress Notes (Signed)
Addended by: Thelma Barge D on: 05/18/2012 01:09 PM   Modules accepted: Orders

## 2012-05-21 ENCOUNTER — Institutional Professional Consult (permissible substitution): Payer: Medicare Other | Admitting: Internal Medicine

## 2012-06-15 ENCOUNTER — Encounter (HOSPITAL_COMMUNITY): Payer: Self-pay | Admitting: Emergency Medicine

## 2012-06-15 ENCOUNTER — Observation Stay (HOSPITAL_COMMUNITY)
Admission: EM | Admit: 2012-06-15 | Discharge: 2012-06-15 | Disposition: A | Discharge status: Home / Self Care | Payer: Medicare Other | Attending: Emergency Medicine | Admitting: Emergency Medicine

## 2012-06-15 ENCOUNTER — Inpatient Hospital Stay (HOSPITAL_COMMUNITY): Admit: 2012-06-15 | Payer: Medicare Other

## 2012-06-15 ENCOUNTER — Emergency Department (HOSPITAL_COMMUNITY): Payer: Medicare Other

## 2012-06-15 DIAGNOSIS — F411 Generalized anxiety disorder: Secondary | ICD-10-CM | POA: Insufficient documentation

## 2012-06-15 DIAGNOSIS — J45909 Unspecified asthma, uncomplicated: Secondary | ICD-10-CM | POA: Insufficient documentation

## 2012-06-15 DIAGNOSIS — F17200 Nicotine dependence, unspecified, uncomplicated: Secondary | ICD-10-CM | POA: Insufficient documentation

## 2012-06-15 DIAGNOSIS — I10 Essential (primary) hypertension: Secondary | ICD-10-CM | POA: Insufficient documentation

## 2012-06-15 DIAGNOSIS — R0602 Shortness of breath: Secondary | ICD-10-CM | POA: Insufficient documentation

## 2012-06-15 DIAGNOSIS — R079 Chest pain, unspecified: Principal | ICD-10-CM | POA: Insufficient documentation

## 2012-06-15 DIAGNOSIS — K219 Gastro-esophageal reflux disease without esophagitis: Secondary | ICD-10-CM | POA: Insufficient documentation

## 2012-06-15 DIAGNOSIS — M47814 Spondylosis without myelopathy or radiculopathy, thoracic region: Secondary | ICD-10-CM | POA: Insufficient documentation

## 2012-06-15 DIAGNOSIS — R072 Precordial pain: Secondary | ICD-10-CM

## 2012-06-15 HISTORY — DX: Aplastic anemia, unspecified: D61.9

## 2012-06-15 HISTORY — DX: Unspecified asthma, uncomplicated: J45.909

## 2012-06-15 HISTORY — DX: Gastro-esophageal reflux disease without esophagitis: K21.9

## 2012-06-15 LAB — CBC WITH DIFFERENTIAL/PLATELET
Eosinophils Relative: 3 % (ref 0–5)
Hemoglobin: 13.9 g/dL (ref 12.0–15.0)
Lymphocytes Relative: 33 % (ref 12–46)
Lymphs Abs: 2.2 10*3/uL (ref 0.7–4.0)
MCV: 97.3 fL (ref 78.0–100.0)
Monocytes Relative: 7 % (ref 3–12)
Neutrophils Relative %: 57 % (ref 43–77)
Platelets: 124 10*3/uL — ABNORMAL LOW (ref 150–400)
RBC: 4.04 MIL/uL (ref 3.87–5.11)
WBC: 6.6 10*3/uL (ref 4.0–10.5)

## 2012-06-15 LAB — BASIC METABOLIC PANEL
BUN: 4 mg/dL — ABNORMAL LOW (ref 6–23)
CO2: 17 mEq/L — ABNORMAL LOW (ref 19–32)
Glucose, Bld: 93 mg/dL (ref 70–99)
Potassium: 3.5 mEq/L (ref 3.5–5.1)
Sodium: 137 mEq/L (ref 135–145)

## 2012-06-15 LAB — POCT I-STAT 3, ART BLOOD GAS (G3+)
O2 Saturation: 93 %
TCO2: 23 mmol/L (ref 0–100)
pCO2 arterial: 41.7 mmHg (ref 35.0–45.0)

## 2012-06-15 LAB — POCT I-STAT TROPONIN I: Troponin i, poc: 0 ng/mL (ref 0.00–0.08)

## 2012-06-15 MED ORDER — PANTOPRAZOLE SODIUM 40 MG IV SOLR
40.0000 mg | Freq: Once | INTRAVENOUS | Status: AC
  Administered 2012-06-15: 40 mg via INTRAVENOUS
  Filled 2012-06-15: qty 40

## 2012-06-15 MED ORDER — TRAMADOL HCL 50 MG PO TABS
50.0000 mg | ORAL_TABLET | Freq: Once | ORAL | Status: AC
  Administered 2012-06-15: 50 mg via ORAL
  Filled 2012-06-15: qty 1

## 2012-06-15 MED ORDER — OXYCODONE-ACETAMINOPHEN 5-325 MG PO TABS: 1.0000 | ORAL_TABLET | Freq: Four times a day (QID) | ORAL | Status: AC | PRN

## 2012-06-15 MED ORDER — OXYCODONE-ACETAMINOPHEN 5-325 MG PO TABS
1.0000 | ORAL_TABLET | Freq: Once | ORAL | Status: AC
  Administered 2012-06-15: 1 via ORAL
  Filled 2012-06-15: qty 1

## 2012-06-15 MED ORDER — SODIUM CHLORIDE 0.9 % IV SOLN: Freq: Once | INTRAVENOUS | Status: DC

## 2012-06-15 MED ORDER — MORPHINE SULFATE 4 MG/ML IJ SOLN
4.0000 mg | INTRAMUSCULAR | Status: DC | PRN
  Administered 2012-06-15 (×3): 4 mg via INTRAVENOUS
  Filled 2012-06-15 (×3): qty 1

## 2012-06-15 MED ORDER — MORPHINE SULFATE 4 MG/ML IJ SOLN
6.0000 mg | Freq: Once | INTRAMUSCULAR | Status: AC
  Administered 2012-06-15: 6 mg via INTRAVENOUS
  Filled 2012-06-15: qty 2

## 2012-06-15 MED ORDER — KETOROLAC TROMETHAMINE 30 MG/ML IJ SOLN
30.0000 mg | Freq: Once | INTRAMUSCULAR | Status: AC
  Administered 2012-06-15: 30 mg via INTRAVENOUS

## 2012-06-15 MED ORDER — KETOROLAC TROMETHAMINE 30 MG/ML IJ SOLN
INTRAMUSCULAR | Status: AC
  Filled 2012-06-15: qty 1

## 2012-06-15 MED ORDER — ONDANSETRON HCL 4 MG/2ML IJ SOLN
4.0000 mg | Freq: Once | INTRAMUSCULAR | Status: AC
  Administered 2012-06-15: 4 mg via INTRAVENOUS
  Filled 2012-06-15: qty 2

## 2012-06-15 MED ORDER — NITROGLYCERIN 0.4 MG SL SUBL
0.4000 mg | SUBLINGUAL_TABLET | SUBLINGUAL | Status: AC | PRN
  Administered 2012-06-15 (×3): 0.4 mg via SUBLINGUAL
  Filled 2012-06-15: qty 25

## 2012-06-15 MED ORDER — ASPIRIN 81 MG PO CHEW
324.0000 mg | CHEWABLE_TABLET | Freq: Once | ORAL | Status: AC
  Administered 2012-06-15: 324 mg via ORAL
  Filled 2012-06-15: qty 4

## 2012-06-15 MED ORDER — FAMOTIDINE 20 MG PO TABS
20.0000 mg | ORAL_TABLET | Freq: Once | ORAL | Status: AC
  Administered 2012-06-15: 20 mg via ORAL
  Filled 2012-06-15: qty 1

## 2012-06-15 MED ORDER — GI COCKTAIL ~~LOC~~
30.0000 mL | Freq: Once | ORAL | Status: AC
  Administered 2012-06-15: 30 mL via ORAL
  Filled 2012-06-15: qty 30

## 2012-06-15 NOTE — ED Notes (Signed)
Report given to Melody, RN

## 2012-06-15 NOTE — ED Notes (Signed)
Transported for stress test.

## 2012-06-15 NOTE — ED Provider Notes (Signed)
Medical screening examination/treatment/procedure(s) were performed by non-physician practitioner and as supervising physician I was immediately available for consultation/collaboration.  Ethelda Chick, MD 06/15/12 [phone]

## 2012-06-15 NOTE — ED Notes (Signed)
Received patient from Pod A. Heart rate in 90's. Alert. Oriented x3.

## 2012-06-15 NOTE — ED Provider Notes (Signed)
Medical screening examination/treatment/procedure(s) were conducted as a shared visit with non-physician practitioner(s) and myself.  I personally evaluated the patient during the encounter  Sunnie Nielsen, MD 06/15/12 2259

## 2012-06-15 NOTE — ED Notes (Signed)
PT. ARRIVED WITH EMS FROM HOME REPORTS SUBSTERNAL CHEST PAIN WITH SOB AND OCCASIONAL DRY COUGH ONSET YESTERDAY MORNING , DENIES NAUSEA OR DIAPHORESIS. PT. RECEIVED 2 NTG SL WITH SLIGHT RELIEF , 4 BABY ASA AT HOME PTA.

## 2012-06-15 NOTE — ED Provider Notes (Signed)
Pt in the CDU for chest pain protocol. Pt hand off to me from [facility], PA-C  Pt during stay in the ED remained chest pain free. She has an appointment at [facility] Dr Antoine Poche    this coming Tuesday. I have urged patient to keep her appointment for follow-up from her ER visit.   [facility]* *[facility]* [address] [place], [place] [address] [phone]  ------------------------------------------------------------ Stress Echocardiography  Patient: Wendy Edwards, Wendy Edwards MR #: [id number] Study Date: 06/15/2012 Gender: F Age: 46 Height: Weight: BSA: Pt. Status: Room: CD08C  PERFORMING [facility] SONOGRAPHER Perley Jain, RDCS Mindi Curling, Irving Burton cc:  ------------------------------------------------------------  ------------------------------------------------------------ Indications: Chest pain 786.51.  ------------------------------------------------------------ History: PMH: Chest pain. Risk factors: Current tobacco use.  ------------------------------------------------------------ Study Conclusions  Stress ECG conclusions: The stress ECG was normal.  Impressions:  - Normal study after maximal exercise. Bruce protocol. Stress echocardiography. Patient status: Observation.  ------------------------------------------------------------  ------------------------------------------------------------ Baseline ECG: Normal.  ------------------------------------------------------------ Stress protocol:  +---------------+---+------------+--------+ Stage HR BP (mmHg) Symptoms +---------------+---+------------+--------+ Baseline 105------------None  +---------------+---+------------+--------+ Stage 1 111-------------------- +---------------+---+------------+--------+ Stage 2 122-------------------- +---------------+---+------------+--------+ Stage 3  137-------------------- +---------------+---+------------+--------+ Recovery; 1 min166-------------------- +---------------+---+------------+--------+ Recovery; 2 min148-------------------- +---------------+---+------------+--------+ Recovery; 3 min134-------------------- +---------------+---+------------+--------+ Recovery; 5 [id number] (108)-------- +---------------+---+------------+--------+  ------------------------------------------------------------ Stress results: Maximal heart rate during stress was 166bpm (94% of maximal predicted heart rate). The maximal predicted heart rate was 176bpm.The target heart rate was achieved. The rate-pressure product for the peak heart rate and blood pressure was Hg/min. The patient experienced no chest pain during stress. Functional capacity was slightly decreased (20% to 30%).  ------------------------------------------------------------ Stress ECG: The stress ECG was normal.  ------------------------------------------------------------ Rest:  - LV global systolic function was normal. - Normal wall motion; no LV regional wall motion abnormalities. Immediate post stress:  - LV global systolic function was vigorous. - No evidence for new LV regional wall motion abnormalities.  ------------------------------------------------------------ Prepared and Electronically Authenticated by  Dietrich Pates 2013-06-28T18:23:[id number]   Pt has been advised of the symptoms that warrant their return to the ED. Patient has voiced understanding and has agreed to follow-up with the PCP or specialist.   Dorthula Matas, PA 06/15/12 1918

## 2012-06-15 NOTE — ED Notes (Signed)
To stress test

## 2012-06-15 NOTE — ED Provider Notes (Signed)
8:24 AM Patient is to move to CDU for chest pain protocol this morning.  This is a shared visit with Dr Dierdre Highman.  Pt with PMH significant for HTN, smoking; strong family hx CAD. Pt reports continues central chest pain, unrelieved with nitro, morphine, and GI cocktail.  Pt was seen at the end of April with similar symptoms, states she was told she had calcium in her coronary arteries, a pulmonary nodule (CT angio performed).  States she is scheduled to see Dr Antoine Poche next week, but the pain became unbearable so she came to the ED.  On exam, pt is A&Ox4, NAD, RRR (occasionally tachycardic to high 90s, low 100s), no m/r/g, CTAB, abd soft, NT, extremities without edema, distal pulses intact and equal bilaterally.  Plan is for coronary CT this morning.  Will continue to follow.    Filed Vitals:   06/15/12 0746  BP: 150/80  Pulse: 77  Temp:   Resp: 14    11:47 AM Per Melody (RN), Dr Jeronimo Greaves has asked to cancel the coronary CT.  Patient's resting HR is too high for metoprolol to be effective.    11:58 AM Patient reports continued substernal chest pain.  States she has had the pain intermittently for 4-5 months, but previously it was dull, yesterday it worsened and became sharp.  Pt unable to do coronary CT, thinks she may be able to do stress echo.  Pt has not been given metoprolol.  As pt is scheduled to see Dr Antoine Poche next week, I will consult [place] cardiology for further guidance regarding stress echo vs consultation in ED.    12:22 PM I spoke with [facility] cardiology who will send PA to see patient in CDU.    1:24 PM I spoke with Ward Givens, PA, [facility] cardiology about patient.  Patient may be able to do exercise stress test this afternoon.  Discussed labs and ABG with Dr Rubin Payor who agrees that results are likely chronic and are improving, no need for further workup or treatment of this issue.  1:47 PM Ward Givens has arranged with vascular techs to do exercise stress test today.  I have  placed the order and anticipate this will be done this afternoon.      1:55 PM I have discussed the new plan with the patient and although she told me she would be able to do the treadmill test, she now states she only had flip flops and has no one who can bring her sneakers, also she continues to have 5/10 chest pain.  I have asked Candice to repage [place] cardmaster.    2:14 PM Ward Givens, PA, would like to move forward with the test.  States that most inpatients do test in hospital socks.  Also states that doing the test while the patient is having pain is actually ideal and to pass on to the vascular techs that he has okayed this if patient continues to have pain.  Recommends ultram for pain as unable to give narcotics (safety during test).    3:32 PM Pt remains on chest pain protocol, plan is for exercise stress today.  Pt signed out to Marlon Pel, PA-C, who assumes care of patient at change of shift.  Pt has f/u already scheduled with Dr Antoine Poche for next week [facility] cards).    Results for orders placed during the hospital encounter of 06/15/12  CBC WITH DIFFERENTIAL      Component Value Range   WBC 6.6  4.0 - 10.5 K/uL  RBC 4.04  3.87 - 5.11 MIL/uL   Hemoglobin 13.9  12.0 - 15.0 g/dL   HCT 08.6  57.8 - 46.9 %   MCV 97.3  78.0 - 100.0 fL   MCH 34.4 (*) 26.0 - 34.0 pg   MCHC 35.4  30.0 - 36.0 g/dL   RDW 62.9  52.8 - 41.3 %   Platelets 124 (*) 150 - 400 K/uL   Neutrophils Relative 57  43 - 77 %   Neutro Abs 3.8  1.7 - 7.7 K/uL   Lymphocytes Relative 33  12 - 46 %   Lymphs Abs 2.2  0.7 - 4.0 K/uL   Monocytes Relative 7  3 - 12 %   Monocytes Absolute 0.4  0.1 - 1.0 K/uL   Eosinophils Relative 3  0 - 5 %   Eosinophils Absolute 0.2  0.0 - 0.7 K/uL   Basophils Relative 0  0 - 1 %   Basophils Absolute 0.0  0.0 - 0.1 K/uL  BASIC METABOLIC PANEL      Component Value Range   Sodium 137  135 - 145 mEq/L   Potassium 3.5  3.5 - 5.1 mEq/L   Chloride 100  96 - 112 mEq/L   CO2 17  (*) 19 - 32 mEq/L   Glucose, Bld 93  70 - 99 mg/dL   BUN 4 (*) 6 - 23 mg/dL   Creatinine, Ser 2.44  0.50 - 1.10 mg/dL   Calcium 9.1  8.4 - 01.0 mg/dL   GFR calc non Af Amer >90  >90 mL/min   GFR calc Af Amer >90  >90 mL/min  D-DIMER, QUANTITATIVE      Component Value Range   D-Dimer, Quant 0.35  0.00 - 0.48 ug/mL-FEU  POCT I-STAT TROPONIN I      Component Value Range   Troponin i, poc 0.00  0.00 - 0.08 ng/mL   Comment 3           POCT I-STAT TROPONIN I      Component Value Range   Troponin i, poc 0.00  0.00 - 0.08 ng/mL   Comment 3           POCT I-STAT 3, BLOOD GAS (G3+)      Component Value Range   pH, Arterial 7.322 (*) 7.350 - 7.400   pCO2 arterial 41.7  35.0 - 45.0 mmHg   pO2, Arterial 73.0 (*) 80.0 - 100.0 mmHg   Bicarbonate 21.6  20.0 - 24.0 mEq/L   TCO2 23  0 - 100 mmol/L   O2 Saturation 93.0     Acid-base deficit 4.0 (*) 0.0 - 2.0 mmol/L   Collection site RADIAL, ALLEN'S TEST ACCEPTABLE     Drawn by Operator     Sample type ARTERIAL     Dg Chest 2 View  06/15/2012  *RADIOLOGY REPORT*  Clinical Data: Chest pain and pressure since yesterday.  Smoker. History of lung nodule.  CHEST - 2 VIEW  Comparison: Chest 04/13/2012.  CT chest 04/13/2012.  Findings: Normal heart size and pulmonary vascularity.  Mild hyperinflation.  Peribronchial opacities suggesting chronic bronchitis.  No focal airspace consolidation in the lungs.  No blunting of costophrenic angles.  No pneumothorax.  11 mm nodular opacity demonstrated inferior to the pulmonary veins on the lateral view may correspond with the lesions seen on chest CT.  Mild degenerative change in the thoracic spine.  IMPRESSION: Chronic bronchitic changes.  No evidence of active pulmonary disease.  Original Report Authenticated By: Tawni Pummel.  Andria Meuse, M.D.      Dillard Cannon [place], [place] 06/15/12 1533

## 2012-06-15 NOTE — ED Provider Notes (Signed)
History     CSN: [id number]  Arrival date & time 06/15/12  0146   First MD Initiated Contact with Patient 06/15/12 0216      Chief Complaint  Patient presents with  . Chest Pain    (Consider location/radiation/quality/duration/timing/severity/associated sxs/prior treatment) HPI History provided by patient. Substernal chest pain on and off worse tonight. No known aggravating or alleviating factors. Feel sharp and pressure-like in quality. Some occasional shortness of breath. No fevers. No cough. No nausea or diaphoresis. Called EMS, had aspirin and nitroglycerin without relief. Denies any heartburn or reflux symptoms. Does have history of same. No leg pain or swelling. Remote history of aplastic anemia about 12 years ago. No history of DVT or PE. Pain moderate in severity. Pain continuous tonight. Past Medical History  Diagnosis Date  . Hypertension   . GERD (gastroesophageal reflux disease)   . Asthma   . Anxiety   . Aplastic anemia     Past Surgical History  Procedure Date  . Abdominal hysterectomy     No family history on file.  History  Substance Use Topics  . Smoking status: Current Everyday Smoker -- 0.5 packs/day for 22 years    Types: Cigarettes  . Smokeless tobacco: Not on file  . Alcohol Use: No    OB History    Grav Para Term Preterm Abortions TAB SAB Ect Mult Living                  Review of Systems  Constitutional: Negative for fever and chills.  HENT: Negative for neck pain and neck stiffness.   Eyes: Negative for pain.  Respiratory: Positive for shortness of breath.   Cardiovascular: Positive for chest pain.  Gastrointestinal: Negative for abdominal pain.  Genitourinary: Negative for dysuria.  Musculoskeletal: Negative for back pain.  Skin: Negative for rash.  Neurological: Negative for headaches.  All other systems reviewed and are negative.    Allergies  Review of patient's allergies indicates no known allergies.  Home Medications    Current Outpatient Rx  Name Route Sig Dispense Refill  . ALBUTEROL SULFATE HFA 108 (90 BASE) MCG/ACT IN AERS Inhalation Inhale 2 puffs into the lungs every 6 (six) hours as needed for shortness of breath. 1 Inhaler 4  . ALPRAZOLAM 1 MG PO TABS Oral Take 1 tablet (1 mg total) by mouth 3 (three) times daily as needed for anxiety. 90 tablet 3  . FAMOTIDINE 20 MG PO TABS Oral Take 1 tablet (20 mg total) by mouth 2 (two) times daily. 30 tablet 0  . OMEPRAZOLE 40 MG PO CPDR Oral Take 1 capsule (40 mg total) by mouth daily. 30 capsule 3  . PANTOPRAZOLE SODIUM 20 MG PO TBEC Oral Take 1 tablet (20 mg total) by mouth daily. 30 tablet 0    BP 128/70  Pulse 89  Temp 98.1 F (36.7 C) (Oral)  Resp 19  SpO2 97%  Physical Exam  Constitutional: She is oriented to person, place, and time. She appears well-developed and well-nourished.  HENT:  Head: Normocephalic and atraumatic.  Eyes: Conjunctivae and EOM are normal. Pupils are equal, round, and reactive to light.  Neck: Trachea normal. Neck supple. No thyromegaly present.  Cardiovascular: Normal rate, regular rhythm, S1 normal, S2 normal and normal pulses.     No systolic murmur is present   No diastolic murmur is present  Pulses:      Radial pulses are 2+ on the right side, and 2+ on the left side.  Pulmonary/Chest:  Effort normal and breath sounds normal. She has no wheezes. She has no rhonchi. She has no rales. She exhibits no tenderness.  Abdominal: Soft. Normal appearance and bowel sounds are normal. There is no tenderness. There is no CVA tenderness and negative Murphy's sign.  Musculoskeletal:       BLE:s Calves nontender, no cords or erythema, negative Homans sign  Neurological: She is alert and oriented to person, place, and time. She has normal strength. No cranial nerve deficit or sensory deficit. GCS eye subscore is 4. GCS verbal subscore is 5. GCS motor subscore is 6.  Skin: Skin is warm and dry. No rash noted. She is not diaphoretic.   Psychiatric: Her speech is normal.       Cooperative and appropriate    ED Course  Procedures (including critical care time)  Results for orders placed during the hospital encounter of 06/15/12  CBC WITH DIFFERENTIAL      Component Value Range   WBC 6.6  4.0 - 10.5 K/uL   RBC 4.04  3.87 - 5.11 MIL/uL   Hemoglobin 13.9  12.0 - 15.0 g/dL   HCT 16.1  09.6 - 04.5 %   MCV 97.3  78.0 - 100.0 fL   MCH 34.4 (*) 26.0 - 34.0 pg   MCHC 35.4  30.0 - 36.0 g/dL   RDW 40.9  81.1 - 91.4 %   Platelets 124 (*) 150 - 400 K/uL   Neutrophils Relative 57  43 - 77 %   Neutro Abs 3.8  1.7 - 7.7 K/uL   Lymphocytes Relative 33  12 - 46 %   Lymphs Abs 2.2  0.7 - 4.0 K/uL   Monocytes Relative 7  3 - 12 %   Monocytes Absolute 0.4  0.1 - 1.0 K/uL   Eosinophils Relative 3  0 - 5 %   Eosinophils Absolute 0.2  0.0 - 0.7 K/uL   Basophils Relative 0  0 - 1 %   Basophils Absolute 0.0  0.0 - 0.1 K/uL  BASIC METABOLIC PANEL      Component Value Range   Sodium 137  135 - 145 mEq/L   Potassium 3.5  3.5 - 5.1 mEq/L   Chloride 100  96 - 112 mEq/L   CO2 17 (*) 19 - 32 mEq/L   Glucose, Bld 93  70 - 99 mg/dL   BUN 4 (*) 6 - 23 mg/dL   Creatinine, Ser 7.82  0.50 - 1.10 mg/dL   Calcium 9.1  8.4 - 95.6 mg/dL   GFR calc non Af Amer >90  >90 mL/min   GFR calc Af Amer >90  >90 mL/min  D-DIMER, QUANTITATIVE      Component Value Range   D-Dimer, Quant 0.35  0.00 - 0.48 ug/mL-FEU  POCT I-STAT TROPONIN I      Component Value Range   Troponin i, poc 0.00  0.00 - 0.08 ng/mL   Comment 3            Dg Chest 2 View  06/15/2012  *RADIOLOGY REPORT*  Clinical Data: Chest pain and pressure since yesterday.  Smoker. History of lung nodule.  CHEST - 2 VIEW  Comparison: Chest 04/13/2012.  CT chest 04/13/2012.  Findings: Normal heart size and pulmonary vascularity.  Mild hyperinflation.  Peribronchial opacities suggesting chronic bronchitis.  No focal airspace consolidation in the lungs.  No blunting of costophrenic angles.  No  pneumothorax.  11 mm nodular opacity demonstrated inferior to the pulmonary veins on the lateral view  may correspond with the lesions seen on chest CT.  Mild degenerative change in the thoracic spine.  IMPRESSION: Chronic bronchitic changes.  No evidence of active pulmonary disease.  Original Report Authenticated By: Marlon Pel, M.D.     Date: 06/15/2012  Rate: 107  Rhythm: sinus tachycardia  QRS Axis: normal  Intervals: normal  ST/T Wave abnormalities: nonspecific ST changes  Conduction Disutrbances:right bundle branch block  Narrative Interpretation:   Old EKG Reviewed: unchanged  ASA PTA. NTG. IV morphine pain control  Labs, EKG and imaging obtained and reviewed as above. MDM   Chest pain low risk for ACS by TIMI. Father died of MI with heart problems in his 36s. Low pretest probability for PE. Negative d-dimer. Pain improved with IV morphine.  Plan CDU observation chest pain protocol.  BMI calculated 26.6, plan CCT in a.m.     Sunnie Nielsen, MD 06/15/12 [phone]

## 2012-06-19 ENCOUNTER — Ambulatory Visit (INDEPENDENT_AMBULATORY_CARE_PROVIDER_SITE_OTHER): Payer: Medicare Other | Admitting: Cardiology

## 2012-06-19 ENCOUNTER — Encounter: Payer: Self-pay | Admitting: Cardiology

## 2012-06-19 VITALS — BP 180/110 | HR 88 | Ht 63.0 in | Wt 152.8 lb

## 2012-06-19 DIAGNOSIS — Z720 Tobacco use: Secondary | ICD-10-CM

## 2012-06-19 DIAGNOSIS — R0789 Other chest pain: Secondary | ICD-10-CM

## 2012-06-19 DIAGNOSIS — F17200 Nicotine dependence, unspecified, uncomplicated: Secondary | ICD-10-CM

## 2012-06-19 DIAGNOSIS — I10 Essential (primary) hypertension: Secondary | ICD-10-CM | POA: Insufficient documentation

## 2012-06-19 MED ORDER — AMLODIPINE BESYLATE 5 MG PO TABS: 5.0000 mg | ORAL_TABLET | Freq: Every day | ORAL | Status: DC

## 2012-06-19 NOTE — Assessment & Plan Note (Signed)
We discussed a specific strategy for tobacco cessation.  (Greater than three minutes discussing tobacco cessation.)  

## 2012-06-19 NOTE — Patient Instructions (Addendum)
Please start amlodipine 5 mg a day Continue all other medications as listed  Follow up with your primary care MD.  Smoking Cessation This document explains the best ways for you to quit smoking and new treatments to help. It lists new medicines that can double or triple your chances of quitting and quitting for good. It also considers ways to avoid relapses and concerns you may have about quitting, including weight gain. NICOTINE: A POWERFUL ADDICTION If you have tried to quit smoking, you know how hard it can be. It is hard because nicotine is a very addictive drug. For some people, it can be as addictive as heroin or cocaine. Usually, people make 2 or 3 tries, or more, before finally being able to quit. Each time you try to quit, you can learn about what helps and what hurts. Quitting takes hard work and a lot of effort, but you can quit smoking. QUITTING SMOKING IS ONE OF THE MOST IMPORTANT THINGS YOU WILL EVER DO.  You will live longer, feel better, and live better.   The impact on your body of quitting smoking is felt almost immediately:   Within 20 minutes, blood pressure decreases. Pulse returns to its normal level.   After 8 hours, carbon monoxide levels in the blood return to normal. Oxygen level increases.   After 24 hours, chance of heart attack starts to decrease. Breath, hair, and body stop smelling like smoke.   After 48 hours, damaged nerve endings begin to recover. Sense of taste and smell improve.   After 72 hours, the body is virtually free of nicotine. Bronchial tubes relax and breathing becomes easier.   After 2 to 12 weeks, lungs can hold more air. Exercise becomes easier and circulation improves.   Quitting will reduce your risk of having a heart attack, stroke, cancer, or lung disease:   After 1 year, the risk of coronary heart disease is cut in half.   After 5 years, the risk of stroke falls to the same as a nonsmoker.   After 10 years, the risk of lung cancer  is cut in half and the risk of other cancers decreases significantly.   After 15 years, the risk of coronary heart disease drops, usually to the level of a nonsmoker.   If you are pregnant, quitting smoking will improve your chances of having a healthy baby.   The people you live with, especially your children, will be healthier.   You will have extra money to spend on things other than cigarettes.  FIVE KEYS TO QUITTING Studies have shown that these 5 steps will help you quit smoking and quit for good. You have the best chances of quitting if you use them together: 1. Get ready.  2. Get support and encouragement.  3. Learn new skills and behaviors.  4. Get medicine to reduce your nicotine addiction and use it correctly.  5. Be prepared for relapse or difficult situations. Be determined to continue trying to quit, even if you do not succeed at first.  1. GET READY  Set a quit date.   Change your environment.   Get rid of ALL cigarettes, ashtrays, matches, and lighters in your home, car, and place of work.   Do not let people smoke in your home.   Review your past attempts to quit. Think about what worked and what did not.   Once you quit, do not smoke. NOT EVEN A PUFF!  2. GET SUPPORT AND ENCOURAGEMENT Studies have shown  that you have a better chance of being successful if you have help. You can get support in many ways.  Tell your family, friends, and coworkers that you are going to quit and need their support. Ask them not to smoke around you.   Talk to your caregivers (doctor, dentist, nurse, pharmacist, psychologist, and/or smoking counselor).   Get individual, group, or telephone counseling and support. The more counseling you have, the better your chances are of quitting. Programs are available at [employer] and health centers. Call your local health department for information about programs in your area.   Spiritual beliefs and practices may help some smokers quit.     Quit meters are Photographer that keep track of quit statistics, such as amount of "quit-time," cigarettes not smoked, and money saved.   Many smokers find one or more of the many self-help books available useful in helping them quit and stay off tobacco.  3. LEARN NEW SKILLS AND BEHAVIORS  Try to distract yourself from urges to smoke. Talk to someone, go for a walk, or occupy your time with a task.   When you first try to quit, change your routine. Take a different route to work. Drink tea instead of coffee. Eat breakfast in a different place.   Do something to reduce your stress. Take a hot bath, exercise, or read a book.   Plan something enjoyable to do every day. Reward yourself for not smoking.   Explore interactive web-based programs that specialize in helping you quit.  4. GET MEDICINE AND USE IT CORRECTLY Medicines can help you stop smoking and decrease the urge to smoke. Combining medicine with the above behavioral methods and support can quadruple your chances of successfully quitting smoking. The U.[place]. Food and Drug Administration (FDA) has approved 7 medicines to help you quit smoking. These medicines fall into 3 categories.  Nicotine replacement therapy (delivers nicotine to your body without the negative effects and risks of smoking):   Nicotine gum: Available over-the-counter.   Nicotine lozenges: Available over-the-counter.   Nicotine inhaler: Available by prescription.   Nicotine nasal spray: Available by prescription.   Nicotine skin patches (transdermal): Available by prescription and over-the-counter.   Antidepressant medicine (helps people abstain from smoking, but how this works is unknown):   Bupropion sustained-release (SR) tablets: Available by prescription.   Nicotinic receptor partial agonist (simulates the effect of nicotine in your brain):   Varenicline tartrate tablets: Available by prescription.   Ask your  caregiver for advice about which medicines to use and how to use them. Carefully read the information on the package.   Everyone who is trying to quit may benefit from using a medicine. If you are pregnant or trying to become pregnant, nursing an infant, you are under age 62, or you smoke fewer than 10 cigarettes per day, talk to your caregiver before taking any nicotine replacement medicines.   You should stop using a nicotine replacement product and call your caregiver if you experience nausea, dizziness, weakness, vomiting, fast or irregular heartbeat, mouth problems with the lozenge or gum, or redness or swelling of the skin around the patch that does not go away.   Do not use any other product containing nicotine while using a nicotine replacement product.   Talk to your caregiver before using these products if you have diabetes, heart disease, asthma, stomach ulcers, you had a recent heart attack, you have high blood pressure that is not controlled with medicine,  a history of irregular heartbeat, or you have been prescribed medicine to help you quit smoking.  5. BE PREPARED FOR RELAPSE OR DIFFICULT SITUATIONS  Most relapses occur within the first 3 months after quitting. Do not be discouraged if you start smoking again. Remember, most people try several times before they finally quit.   You may have symptoms of withdrawal because your body is used to nicotine. You may crave cigarettes, be irritable, feel very hungry, cough often, get headaches, or have difficulty concentrating.   The withdrawal symptoms are only temporary. They are strongest when you first quit, but they will go away within 10 to 14 days.  Here are some difficult situations to watch for:  Alcohol. Avoid drinking alcohol. Drinking lowers your chances of successfully quitting.   Caffeine. Try to reduce the amount of caffeine you consume. It also lowers your chances of successfully quitting.   Other smokers. Being around  smoking can make you want to smoke. Avoid smokers.   Weight gain. Many smokers will gain weight when they quit, usually less than 10 pounds. Eat a healthy diet and stay active. Do not let weight gain distract you from your main goal, quitting smoking. Some medicines that help you quit smoking may also help delay weight gain. You can always lose the weight gained after you quit.   Bad mood or depression. There are a lot of ways to improve your mood other than smoking.  If you are having problems with any of these situations, talk to your caregiver. SPECIAL SITUATIONS AND CONDITIONS Studies suggest that everyone can quit smoking. Your situation or condition can give you a special reason to quit.  Pregnant women/new mothers: By quitting, you protect your baby's health and your own.   Hospitalized patients: By quitting, you reduce health problems and help healing.   Heart attack patients: By quitting, you reduce your risk of a second heart attack.   Lung, head, and neck cancer patients: By quitting, you reduce your chance of a second cancer.   Parents of children and adolescents: By quitting, you protect your children from illnesses caused by secondhand smoke.  QUESTIONS TO THINK ABOUT Think about the following questions before you try to stop smoking. You may want to talk about your answers with your caregiver.  Why do you want to quit?   If you tried to quit in the past, what helped and what did not?   What will be the most difficult situations for you after you quit? How will you plan to handle them?   Who can help you through the tough times? Your family? Friends? Caregiver?   What pleasures do you get from smoking? What ways can you still get pleasure if you quit?  Here are some questions to ask your caregiver:  How can you help me to be successful at quitting?   What medicine do you think would be best for me and how should I take it?   What should I do if I need more help?    What is smoking withdrawal like? How can I get information on withdrawal?  Quitting takes hard work and a lot of effort, but you can quit smoking. FOR MORE INFORMATION  Smokefree.gov (http://www.davis-sullivan.com/) provides free, accurate, evidence-based information and professional assistance to help support the immediate and long-term needs of people trying to quit smoking. Document Released: 11/29/2001 Document Revised: 11/24/2011 Document Reviewed: 09/21/2009 [facility] Patient Information 2012 [place], [place].

## 2012-06-19 NOTE — Assessment & Plan Note (Signed)
Her blood pressure is not controlled. Start Norvasc 5 mg daily. She is asked to keep her blood pressure checked at home. She should follow with her primary provider.

## 2012-06-19 NOTE — Progress Notes (Signed)
HPI Patient presents for followup of chest discomfort. She was recently seen in the emergency room and ruled out for myocardial infarction. She did have a stress echocardiogram. I have reviewed all of these records. She had normal left ventricular function and no evidence of ischemia or infarct.  She did have improvement of her symptoms with oxycodone. She's not received relief with various over-the-counter antacids for GI medications. She continues to have this discomfort. It comes on sporadically. It happens at rest. She walks for exercise and cannot bring this on. It does not radiate to her jaw or her arms. It is intense and substernal. It radiates slightly under the sternum and to the left. She's had some associated nausea and yesterday felt all day. Assessment mild shortness of breath. She does occasionally have exacerbations of shortness of breath takes a bronchodilator. She has a history of asthma. She's had no new palpitations, presyncope or syncope. She is trying to quit smoking.   No Known Allergies  Current Outpatient Prescriptions  Medication Sig Dispense Refill  . albuterol (PROVENTIL HFA;VENTOLIN HFA) 108 (90 BASE) MCG/ACT inhaler Inhale 2 puffs into the lungs every 6 (six) hours as needed for shortness of breath.  1 Inhaler  4  . ALPRAZolam (XANAX) 1 MG tablet Take 1 tablet (1 mg total) by mouth 3 (three) times daily as needed for anxiety.  90 tablet  3  . omeprazole (PRILOSEC) 40 MG capsule Take 1 capsule (40 mg total) by mouth daily.  30 capsule  3  . oxyCODONE-acetaminophen (PERCOCET) 5-325 MG per tablet Take 1 tablet by mouth every 6 (six) hours as needed for pain.  15 tablet  0    Past Medical History  Diagnosis Date  . Hypertension   . GERD (gastroesophageal reflux disease)   . Asthma   . Anxiety   . Aplastic anemia     Past Surgical History  Procedure Date  . Abdominal hysterectomy   . Knee surgery     Right x 2    Family History  Problem Relation Age of Onset   . Coronary artery disease Father 29    CABG    History   Social History  . Marital Status: Legally Separated    Spouse Name: N/A    Number of Children: 3  . Years of Education: N/A   Occupational History  .     Social History Main Topics  . Smoking status: Current Everyday Smoker -- 0.5 packs/day for 22 years    Types: Cigarettes  . Smokeless tobacco: Not on file  . Alcohol Use: No  . Drug Use: No  . Sexually Active: Not on file   Other Topics Concern  . Not on file   Social History Narrative   Lives with brother.    ROS:  Positive for nausea and diarrhea, reflux, asthma. Otherwise as stated in the HPI and negative for all other systems.   PHYSICAL EXAM BP 180/110  Pulse 88  Ht 5\' 3"  (1.6 m)  Wt 152 lb 12.8 oz (69.31 kg)  BMI 27.07 kg/m2 GENERAL:  Well appearing HEENT:  Pupils equal round and reactive, fundi not visualized, oral mucosa unremarkable NECK:  No jugular venous distention, waveform within normal limits, carotid upstroke brisk and symmetric, no bruits, no thyromegaly LYMPHATICS:  No cervical, inguinal adenopathy LUNGS:  Clear to auscultation bilaterally BACK:  No CVA tenderness CHEST:  Unremarkable HEART:  PMI not displaced or sustained,S1 and S2 within normal limits, no S3, no S4, no  clicks, no rubs, no murmurs ABD:  Flat, positive bowel sounds normal in frequency in pitch, no bruits, no rebound, no guarding, no midline pulsatile mass, no hepatomegaly, no splenomegaly EXT:  2 plus pulses throughout, no edema, no cyanosis no clubbing SKIN: Probable hives on the forearms, no nodules NEURO:  Cranial nerves II through XII grossly intact, motor grossly intact throughout [facility]:  Cognitively intact, oriented to person place and time  EKG:  Sinus tachycardia rate 107, incomplete right bundle branch block, poor anterior R wave progression, no acute ST-T wave changes 06/15/12  ASSESSMENT AND PLAN

## 2012-06-19 NOTE — Assessment & Plan Note (Signed)
The patient has ruled out had a negative stress test. I don't think further cardiovascular testing is indicated at this point. She needs aggressive primary risk reduction and followup with her primary provider for evaluation of probable non-anginal chest pain.

## 2012-06-26 ENCOUNTER — Other Ambulatory Visit: Payer: Self-pay | Admitting: *Deleted

## 2012-06-26 DIAGNOSIS — F419 Anxiety disorder, unspecified: Secondary | ICD-10-CM

## 2012-06-26 MED ORDER — ALPRAZOLAM 1 MG PO TABS: 1.0000 mg | ORAL_TABLET | Freq: Three times a day (TID) | ORAL | Status: DC | PRN

## 2012-06-26 NOTE — Telephone Encounter (Signed)
Received a refill request for [facility] from [facility]. [facility] at [phone] once signed by Dr Myna Hidalgo as this is a chronic med for the pt

## 2012-07-02 ENCOUNTER — Other Ambulatory Visit: Payer: Medicare Other | Admitting: Lab

## 2012-07-02 ENCOUNTER — Telehealth: Payer: Self-pay | Admitting: Hematology & Oncology

## 2012-07-02 ENCOUNTER — Ambulatory Visit: Payer: Medicare Other | Admitting: Hematology & Oncology

## 2012-07-02 NOTE — Telephone Encounter (Signed)
Pt cx apt today due to not feeling well and going to the hospital.  She will call back to resch.  Nurse was notified of cx apt

## 2012-07-09 ENCOUNTER — Telehealth: Payer: Self-pay | Admitting: Hematology & Oncology

## 2012-07-09 NOTE — Telephone Encounter (Signed)
Pt called and resch 07/02/12 missed appt to 08/02/12

## 2012-07-17 ENCOUNTER — Other Ambulatory Visit: Payer: Self-pay | Admitting: *Deleted

## 2012-07-17 DIAGNOSIS — J439 Emphysema, unspecified: Secondary | ICD-10-CM

## 2012-07-17 MED ORDER — ALBUTEROL SULFATE HFA 108 (90 BASE) MCG/ACT IN AERS: 2.0000 | INHALATION_SPRAY | Freq: Four times a day (QID) | RESPIRATORY_TRACT | Status: DC | PRN

## 2012-07-17 NOTE — Telephone Encounter (Signed)
Received a refill request for ProAir inhaler & Xanax from [facility]. Refilled ProAir this time x 1 more then to have filled by Dr Antoine Poche who manges her respiratory issues. Denied Xanax as it was recently refilled at [facility]. Rx for ProAir was faxed to [phone].

## 2012-08-01 ENCOUNTER — Telehealth: Payer: Self-pay | Admitting: Hematology & Oncology

## 2012-08-01 NOTE — Telephone Encounter (Signed)
Patient called and cx 08/02/12 appt and stated she will call back later to resch

## 2012-08-02 ENCOUNTER — Other Ambulatory Visit: Payer: Medicare Other | Admitting: Lab

## 2012-08-02 ENCOUNTER — Ambulatory Visit: Payer: Medicare Other | Admitting: Hematology & Oncology

## 2012-09-11 ENCOUNTER — Other Ambulatory Visit: Payer: Self-pay | Admitting: Hematology & Oncology

## 2012-09-13 ENCOUNTER — Telehealth: Payer: Self-pay | Admitting: Hematology & Oncology

## 2012-09-13 NOTE — Telephone Encounter (Signed)
Pt called said she needed appointment after 09-28-12 and after 3pm. She is aware of 10-03-12 appointment at 330 pm

## 2012-10-03 ENCOUNTER — Ambulatory Visit (HOSPITAL_BASED_OUTPATIENT_CLINIC_OR_DEPARTMENT_OTHER): Payer: Medicare Other | Admitting: Hematology & Oncology

## 2012-10-03 ENCOUNTER — Other Ambulatory Visit (HOSPITAL_BASED_OUTPATIENT_CLINIC_OR_DEPARTMENT_OTHER): Payer: Medicare Other | Admitting: Lab

## 2012-10-03 VITALS — BP 137/78 | HR 95 | Temp 98.1°F | Resp 16 | Ht 63.0 in | Wt 149.0 lb

## 2012-10-03 DIAGNOSIS — M545 Low back pain, unspecified: Secondary | ICD-10-CM

## 2012-10-03 DIAGNOSIS — D619 Aplastic anemia, unspecified: Secondary | ICD-10-CM

## 2012-10-03 DIAGNOSIS — G8929 Other chronic pain: Secondary | ICD-10-CM

## 2012-10-03 DIAGNOSIS — R911 Solitary pulmonary nodule: Secondary | ICD-10-CM

## 2012-10-03 DIAGNOSIS — F419 Anxiety disorder, unspecified: Secondary | ICD-10-CM

## 2012-10-03 LAB — CBC WITH DIFFERENTIAL (CANCER CENTER ONLY)
BASO%: 0.3 % (ref 0.0–2.0)
HCT: 43 % (ref 34.8–46.6)
LYMPH#: 1.3 10*3/uL (ref 0.9–3.3)
MONO#: 0.6 10*3/uL (ref 0.1–0.9)
NEUT%: 68.1 % (ref 39.6–80.0)
Platelets: 113 10*3/uL — ABNORMAL LOW (ref 145–400)
RDW: 12.1 % (ref 11.1–15.7)
WBC: 6.2 10*3/uL (ref 3.9–10.0)

## 2012-10-03 LAB — CHCC SATELLITE - SMEAR

## 2012-10-03 MED ORDER — TRAMADOL HCL 50 MG PO TABS: 50.0000 mg | ORAL_TABLET | Freq: Four times a day (QID) | ORAL | Status: DC | PRN

## 2012-10-03 MED ORDER — ALPRAZOLAM 1 MG PO TABS: 1.0000 mg | ORAL_TABLET | Freq: Three times a day (TID) | ORAL | Status: DC | PRN

## 2012-10-03 NOTE — Progress Notes (Signed)
This office note has been dictated.

## 2012-10-04 ENCOUNTER — Telehealth: Payer: Self-pay | Admitting: Hematology & Oncology

## 2012-10-04 NOTE — Telephone Encounter (Signed)
Pt aware of 10-23 CT to drink contrast and be NPO 4 hrs. She is also aware of 11-13 MD appointment

## 2012-10-04 NOTE — Progress Notes (Signed)
DIAGNOSES: 1. Aplastic anemia, in clinical remission. 2. Chest pain of undetermined etiology. 3. Chronic anxiety.  CURRENT THERAPY:  Observation.  INTERIM HISTORY:  Ms. Fritchman comes in for followup.  Since we last saw her, which was probably about 8 months ago, she has been in and out of emergency rooms.  She has been having this chest pain issue.  She does not have any kind of cardiac etiology for this.  She does continue to smoke.  She had a CT angiogram done back in April.  What was noted was a 1.2-cm nodule in the right lower lung.  This we are going to have to follow up with because of her smoking history.  She has not lost weight.  Appetite is doing okay.  She sometimes says she does not eat for a day or so.  She has back issues now.  She lost her doctor because [facility] closed.  She is trying to look for another doctor who will accept her as a patient.  She has had no bleeding.  There have been no rashes.  She has had no headache.  There has been no fever.  PHYSICAL EXAMINATION:  General:  This is a fairly well-developed, well- nourished white female, in no obvious distress.  Vital signs: Temperature 98.1, pulse 95, respiratory rate 16, blood pressure 137/78. Weight is 149.  Head and neck:  Normocephalic, atraumatic skull.  There are no ocular or oral lesions.  There are no palpable cervical or supraclavicular lymph nodes.  Lungs:  Clear bilaterally.  Cardiac: Regular rate and rhythm, with a normal S1 and S2.  There are no murmurs, rubs, or bruits.  Abdomen:  Soft, with good bowel sounds.  There is no palpable abdominal mass.  There is no fluid wave.  There is no palpable hepatosplenomegaly.  Extremities:  Show no clubbing, cyanosis, or edema. Neurologic:  No focal neurological deficits.  LABORATORY STUDIES:  White cell count is 6.2, hemoglobin 14.8, hematocrit 43, platelet count is 113,000.  IMPRESSION:  Ms. Stayton is a 46 year old white female with a history  of aplastic anemia.  She has been in remission now probably for close to 14 years.  This is not an issue from my point of view.  I have noticed that her platelet count is down a little bit.  I am not sure why this would be.  It might be because of any of the new medications that she is on because of this chest pain issue.  My main concern right now, however, is this right lower lobe nodule.  We are going to have to re-evaluate this with another CT scan.  If this is malignant, then there should be growth  since April.  I do not think there is anything else that we need to do with Ms. Nole. We will get her a scan set up in a week or so.  I will see her back in a month.  I will repeat her CBC to recheck her platelets.    ______________________________ Josph Macho, M.D. PRE/MEDQ  D:  10/03/2012  T:  10/04/2012  Job:  [id number]

## 2012-10-04 NOTE — Telephone Encounter (Signed)
Left message on cell phone to call for appointments. Called house phone person said she wasn't the pt and didn't know them. RN aware. I talked to mother she said she would tell her to call us.

## 2012-10-10 ENCOUNTER — Encounter (HOSPITAL_COMMUNITY): Payer: Self-pay | Admitting: Emergency Medicine

## 2012-10-10 ENCOUNTER — Emergency Department (HOSPITAL_COMMUNITY)
Admission: EM | Admit: 2012-10-10 | Discharge: 2012-10-10 | Discharge status: Against Medical Advice | Payer: Medicare Other | Attending: Emergency Medicine | Admitting: Emergency Medicine

## 2012-10-10 ENCOUNTER — Ambulatory Visit (HOSPITAL_COMMUNITY)
Admission: RE | Admit: 2012-10-10 | Discharge: 2012-10-10 | Disposition: A | Discharge status: Home / Self Care | Payer: Medicare Other | Source: Ambulatory Visit | Attending: Hematology & Oncology | Admitting: Hematology & Oncology

## 2012-10-10 DIAGNOSIS — M25569 Pain in unspecified knee: Secondary | ICD-10-CM | POA: Insufficient documentation

## 2012-10-10 DIAGNOSIS — R911 Solitary pulmonary nodule: Secondary | ICD-10-CM

## 2012-10-10 NOTE — ED Notes (Signed)
Pt reports pain and swelling in r/knee x 4 days . Hx knee surgery

## 2012-10-31 ENCOUNTER — Other Ambulatory Visit (HOSPITAL_BASED_OUTPATIENT_CLINIC_OR_DEPARTMENT_OTHER): Payer: Medicare Other | Admitting: Lab

## 2012-10-31 ENCOUNTER — Ambulatory Visit (HOSPITAL_BASED_OUTPATIENT_CLINIC_OR_DEPARTMENT_OTHER): Payer: Medicare Other | Admitting: Medical

## 2012-10-31 VITALS — BP 136/83 | HR 90 | Temp 98.1°F | Resp 16 | Ht 63.0 in | Wt 147.0 lb

## 2012-10-31 DIAGNOSIS — R911 Solitary pulmonary nodule: Secondary | ICD-10-CM

## 2012-10-31 DIAGNOSIS — D619 Aplastic anemia, unspecified: Secondary | ICD-10-CM

## 2012-10-31 DIAGNOSIS — Z862 Personal history of diseases of the blood and blood-forming organs and certain disorders involving the immune mechanism: Secondary | ICD-10-CM | POA: Insufficient documentation

## 2012-10-31 DIAGNOSIS — D696 Thrombocytopenia, unspecified: Secondary | ICD-10-CM

## 2012-10-31 LAB — CBC WITH DIFFERENTIAL (CANCER CENTER ONLY)
BASO#: 0 10*3/uL (ref 0.0–0.2)
EOS%: 2.2 % (ref 0.0–7.0)
HCT: 43.1 % (ref 34.8–46.6)
HGB: 14.8 g/dL (ref 11.6–15.9)
LYMPH#: 0.9 10*3/uL (ref 0.9–3.3)
LYMPH%: 15.7 % (ref 14.0–48.0)
MCH: 34.2 pg — ABNORMAL HIGH (ref 26.0–34.0)
MCHC: 34.3 g/dL (ref 32.0–36.0)
MCV: 100 fL (ref 81–101)
NEUT%: 68.6 % (ref 39.6–80.0)

## 2012-10-31 NOTE — Progress Notes (Signed)
Diagnoses: #1 aplastic anemia, in clinical remission. #2, chest pain, of undetermined etiology. #3 chronic anxiety.  Current therapy: Observation.  Interim history: Wendy Edwards presents today for an office followup visit.  We did see her about a month ago.  When we saw her.  At that time, she had recently had a CT angiogram, secondary to chest pain.  The CT angiogram revealed a 1.2 cm nodule in the right lower lung.  It was recommended she have a followup CT scan.  Her followup CT scan.  On 10/10/2012, revealed previously questioned right lower lobe nodule, most consistent with a prominent epicardial recess, adjacent to the, right inferior pulmonary vein.  This was good news for Wendy Edwards today.  She reports, that she is not having chest pain, for about 2 weeks, now.  She did have a stress test done.  That did not reveal any abnormalities.  In terms of her history of aplastic, anemia.  She's not had any problems.  Her platelet count has been decreased, somewhat, however, this could be related to medication.  She is not, symptomatic.  She's not having any bleeding, issues.  She's not reporting any unintentional weight loss.  She denies any nausea, vomiting, diarrhea, or constipation.  She denies any headaches, visual changes, or rashes.  She denies any recent, chest pain, shortness of breath, or cough.  She denies any fevers, chills, or night sweats.  She denies any lower leg swelling.  She denies any obvious, bleeding.  Review of Systems: Constitutional:Negative for malaise/fatigue, fever, chills, weight loss, diaphoresis, activity change, appetite change, and unexpected weight change.  HEENT: Negative for double vision, blurred vision, visual loss, ear pain, tinnitus, congestion, rhinorrhea, epistaxis sore throat or sinus disease, oral pain/lesion, tongue soreness Respiratory: Negative for cough, chest tightness, shortness of breath, wheezing and stridor.  Cardiovascular: Negative for chest pain,  palpitations, leg swelling, orthopnea, PND, DOE or claudication Gastrointestinal: Negative for nausea, vomiting, abdominal pain, diarrhea, constipation, blood in stool, melena, hematochezia, abdominal distention, anal bleeding, rectal pain, anorexia and hematemesis.  Genitourinary: Negative for dysuria, frequency, hematuria,  Musculoskeletal: Negative for myalgias, back pain, joint swelling, arthralgias and gait problem.  Skin: Negative for rash, color change, pallor and wound.  Neurological:. Negative for dizziness/light-headedness, tremors, seizures, syncope, facial asymmetry, speech difficulty, weakness, numbness, headaches and paresthesias.  Hematological: Negative for adenopathy. Does not bruise/bleed easily.  Psychiatric/Behavioral:  Negative for depression, no loss of interest in normal activity or change in sleep pattern.   Physical Exam: This is a fairly well-developed, well-nourished, 46 year old, white female, in no obvious distress Vitals: Temperature 90.1 degrees, pulse 90, respirations 16, blood pressure 136/83, weight 147 pounds HEENT reveals a normocephalic, atraumatic skull, no scleral icterus, no oral lesions  Neck is supple without any cervical or supraclavicular adenopathy.  Lungs are clear to auscultation bilaterally. There are no wheezes, rales or rhonci Cardiac is regular rate and rhythm with a normal S1 and S2. There are no murmurs, rubs, or bruits.  Abdomen is soft with good bowel sounds, there is no palpable mass. There is no palpable hepatosplenomegaly. There is no palpable fluid wave.  Musculoskeletal no tenderness of the spine, ribs, or hips.  Extremities there are no clubbing, cyanosis, or edema.  Skin no petechia, purpura or ecchymosis Neurologic is nonfocal.  Laboratory Data: White count 5.5, hemoglobin 14.8, hematocrit 43.1, platelets 93,000  Current Outpatient Prescriptions on File Prior to Visit  Medication Sig Dispense Refill  . albuterol (PROVENTIL  HFA;VENTOLIN HFA) 108 (90 BASE) MCG/ACT  inhaler Inhale 2 puffs into the lungs every 6 (six) hours as needed. For shortness of breath.      . ALPRAZolam (XANAX) 1 MG tablet Take 1 tablet (1 mg total) by mouth 3 (three) times daily as needed for anxiety.  90 tablet  3  . amLODipine (NORVASC) 5 MG tablet Take 1 tablet (5 mg total) by mouth daily.  30 tablet  11  . ibuprofen (ADVIL,MOTRIN) 200 MG tablet Take 400 mg by mouth every 3 (three) hours as needed. For pain.      . traMADol (ULTRAM) 50 MG tablet Take 1 tablet (50 mg total) by mouth every 6 (six) hours as needed for pain.  90 tablet  3   Assessment/Plan: This is a pleasant, 46 year old, white female, with the following issues:  #1 .  History of aplastic anemia.  She has been in remission now for close to 14 years.  This currently is not an issue.  #2 thrombocytopenia.  She is asymptomatic.  There are no bleeding, issues.  This could be secondary to medication.  #3.  Right lower lobe lung nodule.  She did have a repeat CT scan.  It was felt this was most consistent with a prominent epicardial recess, adjacent to the, right inferior pulmonary vein.  There is no evidence of any type of malignancy.  #4.  Followup.  Wendy Edwards will follow back up with [place] in 6 months, but before then should there be questions or concerns.

## 2012-11-14 ENCOUNTER — Telehealth: Payer: Self-pay | Admitting: Hematology & Oncology

## 2012-11-14 NOTE — Telephone Encounter (Addendum)
Message copied by Cathi Roan on Wed Nov 14, 2012  1:23 PM ------      Message from: [place], [place] N      Created: Mon Oct 15, 2012  3:07 PM                   ----- Message -----         From: Josph Macho, MD         Sent: 10/14/2012   5:20 PM           To: Onc Nurse Hp            Call - NO obvious lung cancer!!  Please stop smoking!!! Pete  11-14-12   13:24. Called pt's home # and left message for her to call office regarding above MD note. (message on  phone said "you have reached [facility]") Lupita Raider LPN

## 2013-01-18 ENCOUNTER — Other Ambulatory Visit: Payer: Self-pay | Admitting: Hematology & Oncology

## 2013-01-30 ENCOUNTER — Other Ambulatory Visit: Payer: Self-pay | Admitting: Hematology & Oncology

## 2013-02-03 ENCOUNTER — Other Ambulatory Visit: Payer: Self-pay | Admitting: Hematology & Oncology

## 2013-04-29 ENCOUNTER — Telehealth: Payer: Self-pay | Admitting: Hematology & Oncology

## 2013-04-29 NOTE — Telephone Encounter (Signed)
Pt cx 5-14 said would call back to reschedule

## 2013-05-01 ENCOUNTER — Other Ambulatory Visit: Payer: Medicare Other | Admitting: Lab

## 2013-05-01 ENCOUNTER — Ambulatory Visit: Payer: Medicare Other | Admitting: Hematology & Oncology

## 2013-05-29 ENCOUNTER — Other Ambulatory Visit: Payer: Self-pay | Admitting: Hematology & Oncology

## 2013-05-30 ENCOUNTER — Other Ambulatory Visit: Payer: Self-pay | Admitting: Hematology & Oncology

## 2013-07-02 ENCOUNTER — Telehealth: Payer: Self-pay | Admitting: Hematology & Oncology

## 2013-07-02 NOTE — Telephone Encounter (Signed)
Patient called and resch 05/01/13 missed apt for 07/31/13

## 2013-07-31 ENCOUNTER — Ambulatory Visit (HOSPITAL_BASED_OUTPATIENT_CLINIC_OR_DEPARTMENT_OTHER): Payer: Medicare Other | Admitting: Hematology & Oncology

## 2013-07-31 ENCOUNTER — Ambulatory Visit (HOSPITAL_BASED_OUTPATIENT_CLINIC_OR_DEPARTMENT_OTHER): Payer: Medicare Other | Admitting: Lab

## 2013-07-31 VITALS — BP 158/89 | HR 89 | Temp 98.0°F | Resp 20 | Wt 166.0 lb

## 2013-07-31 DIAGNOSIS — Z862 Personal history of diseases of the blood and blood-forming organs and certain disorders involving the immune mechanism: Secondary | ICD-10-CM

## 2013-07-31 DIAGNOSIS — I10 Essential (primary) hypertension: Secondary | ICD-10-CM

## 2013-07-31 DIAGNOSIS — E28319 Asymptomatic premature menopause: Secondary | ICD-10-CM

## 2013-07-31 DIAGNOSIS — R232 Flushing: Secondary | ICD-10-CM

## 2013-07-31 DIAGNOSIS — R635 Abnormal weight gain: Secondary | ICD-10-CM

## 2013-07-31 DIAGNOSIS — F17200 Nicotine dependence, unspecified, uncomplicated: Secondary | ICD-10-CM

## 2013-07-31 DIAGNOSIS — F411 Generalized anxiety disorder: Secondary | ICD-10-CM

## 2013-07-31 LAB — COMPREHENSIVE METABOLIC PANEL
BUN: 8 mg/dL (ref 6–23)
CO2: 24 mEq/L (ref 19–32)
Glucose, Bld: 101 mg/dL — ABNORMAL HIGH (ref 70–99)
Sodium: 138 mEq/L (ref 135–145)
Total Bilirubin: 0.3 mg/dL (ref 0.3–1.2)
Total Protein: 7 g/dL (ref 6.0–8.3)

## 2013-07-31 LAB — CBC WITH DIFFERENTIAL (CANCER CENTER ONLY)
BASO#: 0 10*3/uL (ref 0.0–0.2)
Eosinophils Absolute: 0.2 10*3/uL (ref 0.0–0.5)
LYMPH%: 31.1 % (ref 14.0–48.0)
MCH: 34.1 pg — ABNORMAL HIGH (ref 26.0–34.0)
MCV: 101 fL (ref 81–101)
MONO%: 12.5 % (ref 0.0–13.0)
NEUT%: 52.2 % (ref 39.6–80.0)
Platelets: 166 10*3/uL (ref 145–400)
RBC: 4.37 10*6/uL (ref 3.70–5.32)

## 2013-07-31 LAB — LUTEINIZING HORMONE: LH: 77.2 m[IU]/mL — ABNORMAL HIGH

## 2013-07-31 LAB — CHCC SATELLITE - SMEAR

## 2013-07-31 LAB — TSH CHCC: TSH: 2.637 m(IU)/L (ref 0.308–3.960)

## 2013-07-31 MED ORDER — AMLODIPINE BESYLATE 5 MG PO TABS: 5.0000 mg | ORAL_TABLET | Freq: Every day | ORAL | Status: DC

## 2013-07-31 NOTE — Progress Notes (Signed)
This office note has been dictated.

## 2013-08-01 ENCOUNTER — Telehealth: Payer: Self-pay | Admitting: *Deleted

## 2013-08-01 NOTE — Telephone Encounter (Signed)
Called patient to let her know that her labwork showed that her thyroid levels were good and she is also in the change of life per dr. Myna Hidalgo.

## 2013-08-01 NOTE — Progress Notes (Signed)
DIAGNOSES: 1. Aplastic anemia -- clinical remission. 2. Chronic anxiety. 3. Hypertension.  CURRENT THERAPY:  Observation.  INTERIM HISTORY:  Wendy Edwards comes in for followup.  She was last seen back in November.  As always, there is drama in her life. Unfortunately, her father has had 2 heart attacks.  He had surgery. Seems to be getting a little bit better from this.  Her mom is doing okay.  She has a son living with her.  He and his wife are expecting in February.  She is not being seen by a family doctor now.  She was being seen by [facility].  They closed.  She has not gotten another family doctor.  She is gaining weight.  She says she is not eating all that much.  Since we last saw her, she has gained almost 20 pounds.  She has had no bleeding.  She has had no cough.  She is still smoking.  There have been no rashes. There has been no bruising.  She has not had any monthly cycle because she has had a hysterectomy 10 years ago.  She does complain of some hot flashes and sweats.  She may be going through the change of life.  Will check estrogen and FSH and LH levels on her.  PHYSICAL EXAMINATION:  General:  This is a mildly obese white female in no obvious distress.  Vital signs:  Temperature of 98, pulse 89, respiratory rate 20, blood pressure 157/89.  Weight is 166.  Head and neck:  Normocephalic, atraumatic skull.  There are no ocular or oral lesions.  There are no palpable cervical or supraclavicular lymph nodes. Lungs:  Clear bilaterally.  Cardiac:  Regular rate and rhythm, with a normal S1, S2.  There are no murmurs, rubs, or bruits.  Breasts:  Show no bilateral breast masses.  There is no bilateral axillary adenopathy. Abdomen:  Soft.  She has good bowel sounds.  There is no fluid wave. There is no palpable hepatosplenomegaly.  Back:  No tenderness over the spine, ribs, or hips.  Extremities:  Show no clubbing, cyanosis, or edema.  She has good range  motion of her joints.  She has good pulses in her distal extremities.  Skin:  Shows no rashes.  Neurological.  No focal neurological deficits.  LABORATORY STUDIES:  White cell count of 6.1, hemoglobin 14.9, hematocrit 44.1, platelet count 166.  TSH is 2.6.  IMPRESSION:  Wendy Edwards is a 47 year old __________ female with a remote history of aplastic anemia.  She is cured of this.  Unfortunately, since she has no family doctor, I will try to do as much as I can getting her routine health studies done.  This is why I did a complete exam on her today.  She needs a mammogram.  Will get this set up for her in a couple weeks. She needs a chest x-ray.  We will also get this set up for her.  I renewed her Norvasc prescription.  She has not taken this for a while. She did have a lot of headaches.  I suspect that this might be from high blood pressure.  I told her to take 1 baby aspirin a day.  I do not see any reason why she cannot take aspirin.  She promises that she will find a family doctor.  Will see if this happens.  I will plan to see her back in about 3 months' time.  I just want make sure that we continue  to follow her up and make sure that she does not run into problems with health issues.    ______________________________ Josph Macho, M.D. PRE/MEDQ  D:  07/31/2013  T:  08/01/2013  Job:  [id number]

## 2013-08-01 NOTE — Telephone Encounter (Signed)
Message copied by Anselm Jungling on Thu Aug 01, 2013  1:14 PM ------      Message from: Arlan Organ R      Created: Thu Aug 01, 2013  7:42 AM       Call -you are into the "change of life"  this is why he had a sweats at nighttime. chief ------

## 2013-08-06 LAB — ESTRADIOL, ULTRA SENS: Estradiol, Ultra Sensitive: 8 pg/mL

## 2013-08-09 ENCOUNTER — Telehealth: Payer: Self-pay | Admitting: Hematology & Oncology

## 2013-08-09 ENCOUNTER — Ambulatory Visit (HOSPITAL_BASED_OUTPATIENT_CLINIC_OR_DEPARTMENT_OTHER): Payer: Medicare Other

## 2013-08-09 ENCOUNTER — Other Ambulatory Visit (HOSPITAL_BASED_OUTPATIENT_CLINIC_OR_DEPARTMENT_OTHER): Payer: Medicare Other

## 2013-08-09 NOTE — Telephone Encounter (Signed)
MD aware pt no show for chest x-ray,mammogram

## 2013-09-19 ENCOUNTER — Other Ambulatory Visit: Payer: Self-pay | Admitting: Hematology & Oncology

## 2013-09-20 ENCOUNTER — Other Ambulatory Visit: Payer: Self-pay | Admitting: Hematology & Oncology

## 2013-09-25 ENCOUNTER — Telehealth: Payer: Self-pay | Admitting: Hematology & Oncology

## 2013-09-25 NOTE — Telephone Encounter (Signed)
Left message moved 11-12 time

## 2013-10-18 ENCOUNTER — Other Ambulatory Visit: Payer: Self-pay | Admitting: Hematology & Oncology

## 2013-10-28 ENCOUNTER — Telehealth: Payer: Self-pay | Admitting: Hematology & Oncology

## 2013-10-28 NOTE — Telephone Encounter (Signed)
Pt called to cancel and will cb on Wed to reschedule to a later time.

## 2013-10-30 ENCOUNTER — Ambulatory Visit: Payer: Medicare Other | Admitting: Hematology & Oncology

## 2013-12-12 ENCOUNTER — Emergency Department (HOSPITAL_COMMUNITY): Payer: Medicare Other

## 2013-12-12 ENCOUNTER — Emergency Department (HOSPITAL_COMMUNITY)
Admission: EM | Admit: 2013-12-12 | Discharge: 2013-12-12 | Disposition: A | Discharge status: Home / Self Care | Payer: Medicare Other | Attending: Emergency Medicine | Admitting: Emergency Medicine

## 2013-12-12 ENCOUNTER — Encounter (HOSPITAL_COMMUNITY): Payer: Self-pay | Admitting: Emergency Medicine

## 2013-12-12 DIAGNOSIS — Y92009 Unspecified place in unspecified non-institutional (private) residence as the place of occurrence of the external cause: Secondary | ICD-10-CM | POA: Insufficient documentation

## 2013-12-12 DIAGNOSIS — R262 Difficulty in walking, not elsewhere classified: Secondary | ICD-10-CM | POA: Insufficient documentation

## 2013-12-12 DIAGNOSIS — J449 Chronic obstructive pulmonary disease, unspecified: Secondary | ICD-10-CM | POA: Insufficient documentation

## 2013-12-12 DIAGNOSIS — X58XXXA Exposure to other specified factors, initial encounter: Secondary | ICD-10-CM | POA: Insufficient documentation

## 2013-12-12 DIAGNOSIS — R062 Wheezing: Secondary | ICD-10-CM | POA: Insufficient documentation

## 2013-12-12 DIAGNOSIS — S90121A Contusion of right lesser toe(s) without damage to nail, initial encounter: Secondary | ICD-10-CM

## 2013-12-12 DIAGNOSIS — IMO0002 Reserved for concepts with insufficient information to code with codable children: Secondary | ICD-10-CM | POA: Insufficient documentation

## 2013-12-12 DIAGNOSIS — S90129A Contusion of unspecified lesser toe(s) without damage to nail, initial encounter: Secondary | ICD-10-CM | POA: Insufficient documentation

## 2013-12-12 DIAGNOSIS — F17200 Nicotine dependence, unspecified, uncomplicated: Secondary | ICD-10-CM | POA: Insufficient documentation

## 2013-12-12 DIAGNOSIS — I10 Essential (primary) hypertension: Secondary | ICD-10-CM | POA: Insufficient documentation

## 2013-12-12 DIAGNOSIS — D619 Aplastic anemia, unspecified: Secondary | ICD-10-CM | POA: Insufficient documentation

## 2013-12-12 DIAGNOSIS — K219 Gastro-esophageal reflux disease without esophagitis: Secondary | ICD-10-CM | POA: Insufficient documentation

## 2013-12-12 DIAGNOSIS — F411 Generalized anxiety disorder: Secondary | ICD-10-CM | POA: Insufficient documentation

## 2013-12-12 DIAGNOSIS — Z79899 Other long term (current) drug therapy: Secondary | ICD-10-CM | POA: Insufficient documentation

## 2013-12-12 DIAGNOSIS — Y939 Activity, unspecified: Secondary | ICD-10-CM | POA: Insufficient documentation

## 2013-12-12 DIAGNOSIS — F1010 Alcohol abuse, uncomplicated: Secondary | ICD-10-CM | POA: Insufficient documentation

## 2013-12-12 DIAGNOSIS — J4489 Other specified chronic obstructive pulmonary disease: Secondary | ICD-10-CM | POA: Insufficient documentation

## 2013-12-12 MED ORDER — PROMETHAZINE HCL 25 MG PO TABS
ORAL_TABLET | ORAL | Status: AC
  Administered 2013-12-12: 25 mg via ORAL
  Filled 2013-12-12: qty 3

## 2013-12-12 MED ORDER — AZITHROMYCIN 1 G PO PACK
PACK | ORAL | Status: AC
  Administered 2013-12-12: 1 g via ORAL
  Filled 2013-12-12: qty 1

## 2013-12-12 MED ORDER — METRONIDAZOLE 500 MG PO TABS
2000.0000 mg | ORAL_TABLET | Freq: Once | ORAL | Status: AC
  Administered 2013-12-12: 2000 mg via ORAL

## 2013-12-12 MED ORDER — IBUPROFEN 800 MG PO TABS
800.0000 mg | ORAL_TABLET | Freq: Once | ORAL | Status: AC
  Administered 2013-12-12: 800 mg via ORAL
  Filled 2013-12-12: qty 1

## 2013-12-12 MED ORDER — CEFIXIME 400 MG PO TABS
ORAL_TABLET | ORAL | Status: AC
  Administered 2013-12-12: 400 mg via ORAL
  Filled 2013-12-12: qty 1

## 2013-12-12 MED ORDER — AZITHROMYCIN 1 G PO PACK
1.0000 g | PACK | Freq: Once | ORAL | Status: AC
  Administered 2013-12-12: 1 g via ORAL

## 2013-12-12 MED ORDER — LEVONORGESTREL 0.75 MG PO TABS: 1.5000 mg | ORAL_TABLET | Freq: Once | ORAL | Status: DC

## 2013-12-12 MED ORDER — LEVONORGESTREL 0.75 MG PO TABS
ORAL_TABLET | ORAL | Status: AC
  Filled 2013-12-12: qty 2

## 2013-12-12 MED ORDER — METRONIDAZOLE 500 MG PO TABS
ORAL_TABLET | ORAL | Status: AC
  Administered 2013-12-12: 2000 mg via ORAL
  Filled 2013-12-12: qty 4

## 2013-12-12 MED ORDER — CEFIXIME 400 MG PO TABS
400.0000 mg | ORAL_TABLET | Freq: Once | ORAL | Status: AC
  Administered 2013-12-12: 400 mg via ORAL

## 2013-12-12 MED ORDER — HYDROCODONE-ACETAMINOPHEN 5-325 MG PO TABS
2.0000 | ORAL_TABLET | Freq: Once | ORAL | Status: AC
  Administered 2013-12-12: 2 via ORAL
  Filled 2013-12-12: qty 2

## 2013-12-12 MED ORDER — ALBUTEROL SULFATE (5 MG/ML) 0.5% IN NEBU
5.0000 mg | INHALATION_SOLUTION | Freq: Once | RESPIRATORY_TRACT | Status: AC
  Administered 2013-12-12: 5 mg via RESPIRATORY_TRACT
  Filled 2013-12-12: qty 1

## 2013-12-12 MED ORDER — PROMETHAZINE HCL 25 MG PO TABS
25.0000 mg | ORAL_TABLET | Freq: Four times a day (QID) | ORAL | Status: DC | PRN
  Administered 2013-12-12: 25 mg via ORAL

## 2013-12-12 MED ORDER — HYDROCODONE-ACETAMINOPHEN 5-325 MG PO TABS: 1.0000 | ORAL_TABLET | ORAL | Status: DC | PRN
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
## 2013-12-12 NOTE — ED Notes (Signed)
Sane Nurse Mardella Layman told this nurse she is finished with pt.

## 2013-12-12 NOTE — ED Notes (Signed)
SANE nurse at bedside.

## 2013-12-12 NOTE — ED Notes (Signed)
Pt resting quietly at the time. Vital signs stable. No signs of distress noted. 

## 2013-12-12 NOTE — ED Notes (Signed)
SANE nurse paged

## 2013-12-12 NOTE — ED Notes (Signed)
SANE nurse to be at hospital x1 hour.

## 2013-12-13 ENCOUNTER — Telehealth: Payer: Self-pay | Admitting: Hematology & Oncology

## 2013-12-13 NOTE — Telephone Encounter (Signed)
Pt left message to schedule appointment. I left her message to call °

## 2013-12-13 NOTE — Telephone Encounter (Signed)
Pt made 1-20 appointment

## 2013-12-17 NOTE — SANE Note (Signed)
REFERRAL TO [facility] WAS FAXED ON 12-12-2013.

## 2013-12-27 ENCOUNTER — Encounter: Payer: Medicare Other | Admitting: Nurse Practitioner

## 2014-01-06 ENCOUNTER — Other Ambulatory Visit: Payer: Self-pay

## 2014-01-06 DIAGNOSIS — Z862 Personal history of diseases of the blood and blood-forming organs and certain disorders involving the immune mechanism: Secondary | ICD-10-CM

## 2014-01-07 ENCOUNTER — Other Ambulatory Visit (HOSPITAL_BASED_OUTPATIENT_CLINIC_OR_DEPARTMENT_OTHER): Payer: Medicare Other | Admitting: Lab

## 2014-01-07 ENCOUNTER — Encounter: Payer: Self-pay | Admitting: Hematology & Oncology

## 2014-01-07 ENCOUNTER — Other Ambulatory Visit: Payer: Self-pay | Admitting: *Deleted

## 2014-01-07 ENCOUNTER — Ambulatory Visit (HOSPITAL_BASED_OUTPATIENT_CLINIC_OR_DEPARTMENT_OTHER): Payer: Medicare Other | Admitting: Hematology & Oncology

## 2014-01-07 VITALS — BP 164/88 | HR 108 | Temp 98.3°F | Resp 14 | Ht 63.0 in | Wt 162.0 lb

## 2014-01-07 DIAGNOSIS — F064 Anxiety disorder due to known physiological condition: Secondary | ICD-10-CM

## 2014-01-07 DIAGNOSIS — F17200 Nicotine dependence, unspecified, uncomplicated: Secondary | ICD-10-CM

## 2014-01-07 DIAGNOSIS — Z862 Personal history of diseases of the blood and blood-forming organs and certain disorders involving the immune mechanism: Secondary | ICD-10-CM

## 2014-01-07 LAB — CBC WITH DIFFERENTIAL (CANCER CENTER ONLY)
BASO#: 0 10*3/uL (ref 0.0–0.2)
BASO%: 0.3 % (ref 0.0–2.0)
EOS ABS: 0.2 10*3/uL (ref 0.0–0.5)
EOS%: 2.6 % (ref 0.0–7.0)
HCT: 45.8 % (ref 34.8–46.6)
HEMOGLOBIN: 15.5 g/dL (ref 11.6–15.9)
LYMPH#: 2.3 10*3/uL (ref 0.9–3.3)
LYMPH%: 26.5 % (ref 14.0–48.0)
MCH: 33.5 pg (ref 26.0–34.0)
MCHC: 33.8 g/dL (ref 32.0–36.0)
MCV: 99 fL (ref 81–101)
MONO#: 1 10*3/uL — AB (ref 0.1–0.9)
MONO%: 10.8 % (ref 0.0–13.0)
NEUT%: 59.8 % (ref 39.6–80.0)
NEUTROS ABS: 5.2 10*3/uL (ref 1.5–6.5)
Platelets: 149 10*3/uL (ref 145–400)
RBC: 4.63 10*6/uL (ref 3.70–5.32)
RDW: 12.9 % (ref 11.1–15.7)
WBC: 8.8 10*3/uL (ref 3.9–10.0)

## 2014-01-07 MED ORDER — ALPRAZOLAM 1 MG PO TABS: 1.0000 mg | ORAL_TABLET | Freq: Three times a day (TID) | ORAL | Status: DC | PRN

## 2014-01-07 NOTE — Progress Notes (Signed)
This office note has been dictated.

## 2014-01-07 NOTE — Patient Instructions (Signed)
Smoking Cessation, Tips for Success If you are ready to quit smoking, congratulations! You have chosen to help yourself be healthier. Cigarettes bring nicotine, tar, carbon monoxide, and other irritants into your body. Your lungs, heart, and blood vessels will be able to work better without these poisons. There are many different ways to quit smoking. Nicotine gum, nicotine patches, a nicotine inhaler, or nicotine nasal spray can help with physical craving. Hypnosis, support groups, and medicines help break the habit of smoking. WHAT THINGS CAN I DO TO MAKE QUITTING EASIER?  Here are some tips to help you quit for good:  Pick a date when you will quit smoking completely. Tell all of your friends and family about your plan to quit on that date.  Do not try to slowly cut down on the number of cigarettes you are smoking. Pick a quit date and quit smoking completely starting on that day.  Throw away all cigarettes.   Clean and remove all ashtrays from your home, work, and car.   On a card, write down your reasons for quitting. Carry the card with you and read it when you get the urge to smoke.   Cleanse your body of nicotine. Drink enough water and fluids to keep your urine clear or pale yellow. Do this after quitting to flush the nicotine from your body.   Learn to predict your moods. Do not let a bad situation be your excuse to have a cigarette. Some situations in your life might tempt you into wanting a cigarette.   Never have "just one" cigarette. It leads to wanting another and another. Remind yourself of your decision to quit.   Change habits associated with smoking. If you smoked while driving or when feeling stressed, try other activities to replace smoking. Stand up when drinking your coffee. Brush your teeth after eating. Sit in a different chair when you read the paper. Avoid alcohol while trying to quit, and try to drink fewer caffeinated beverages. Alcohol and caffeine may urge  you to smoke.   Avoid foods and drinks that can trigger a desire to smoke, such as sugary or spicy foods and alcohol.   Ask people who smoke not to smoke around you.   Have something planned to do right after eating or having a cup of coffee. For example, plan to take a walk or exercise.   Try a relaxation exercise to calm you down and decrease your stress. Remember, you may be tense and nervous for the first 2 weeks after you quit, but this will pass.   Find new activities to keep your hands busy. Play with a pen, coin, or rubber band. Doodle or draw things on paper.   Brush your teeth right after eating. This will help cut down on the craving for the taste of tobacco after meals. You can also try mouthwash.   Use oral substitutes in place of cigarettes. Try using lemon drops, carrots, cinnamon sticks, or chewing gum. Keep them handy so they are available when you have the urge to smoke.   When you have the urge to smoke, try deep breathing.   Designate your home as a nonsmoking area.   If you are a heavy smoker, ask your health care provider about a prescription for nicotine chewing gum. It can ease your withdrawal from nicotine.   Reward yourself. Set aside the cigarette money you save and buy yourself something nice.   Look for support from others. Join a support group or   smoking cessation program. Ask someone at home or at work to help you with your plan to quit smoking.   Always ask yourself, "Do I need this cigarette or is this just a reflex?" Tell yourself, "Today, I choose not to smoke," or "I do not want to smoke." You are reminding yourself of your decision to quit.  Do not replace cigarette smoking with electronic cigarettes (commonly called e-cigarettes). The safety of e-cigarettes is unknown, and some may contain harmful chemicals.  If you relapse, do not give up! Plan ahead and think about what you will do the next time you get the urge to smoke.  HOW WILL  I FEEL WHEN I QUIT SMOKING? You may have symptoms of withdrawal because your body is used to nicotine (the addictive substance in cigarettes). You may crave cigarettes, be irritable, feel very hungry, cough often, get headaches, or have difficulty concentrating. The withdrawal symptoms are only temporary. They are strongest when you first quit but will go away within 10 14 days. When withdrawal symptoms occur, stay in control. Think about your reasons for quitting. Remind yourself that these are signs that your body is healing and getting used to being without cigarettes. Remember that withdrawal symptoms are easier to treat than the major diseases that smoking can cause.  Even after the withdrawal is over, expect periodic urges to smoke. However, these cravings are generally short lived and will go away whether you smoke or not. Do not smoke!  WHAT RESOURCES ARE AVAILABLE TO HELP ME QUIT SMOKING? Your health care provider can direct you to community resources or hospitals for support, which may include:  Group support.  Education.  Hypnosis.  Therapy. Document Released: 09/02/2004 Document Revised: 09/25/2013 Document Reviewed: 05/23/2013 [employer] Patient Information 2014 [employer].  
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
## 2014-03-03 ENCOUNTER — Other Ambulatory Visit: Payer: Self-pay | Admitting: Hematology & Oncology

## 2014-04-30 ENCOUNTER — Other Ambulatory Visit: Payer: Self-pay | Admitting: Hematology & Oncology

## 2014-05-18 ENCOUNTER — Encounter (HOSPITAL_COMMUNITY): Payer: Self-pay | Admitting: Emergency Medicine

## 2014-05-18 ENCOUNTER — Emergency Department (HOSPITAL_COMMUNITY)
Admission: EM | Admit: 2014-05-18 | Discharge: 2014-05-18 | Disposition: A | Discharge status: Home / Self Care | Payer: Medicare Other | Attending: Emergency Medicine | Admitting: Emergency Medicine

## 2014-05-18 DIAGNOSIS — Z79899 Other long term (current) drug therapy: Secondary | ICD-10-CM | POA: Insufficient documentation

## 2014-05-18 DIAGNOSIS — Z862 Personal history of diseases of the blood and blood-forming organs and certain disorders involving the immune mechanism: Secondary | ICD-10-CM | POA: Insufficient documentation

## 2014-05-18 DIAGNOSIS — J45909 Unspecified asthma, uncomplicated: Secondary | ICD-10-CM | POA: Insufficient documentation

## 2014-05-18 DIAGNOSIS — K6289 Other specified diseases of anus and rectum: Secondary | ICD-10-CM

## 2014-05-18 DIAGNOSIS — K644 Residual hemorrhoidal skin tags: Secondary | ICD-10-CM | POA: Insufficient documentation

## 2014-05-18 DIAGNOSIS — F17200 Nicotine dependence, unspecified, uncomplicated: Secondary | ICD-10-CM | POA: Insufficient documentation

## 2014-05-18 DIAGNOSIS — I10 Essential (primary) hypertension: Secondary | ICD-10-CM | POA: Insufficient documentation

## 2014-05-18 DIAGNOSIS — K648 Other hemorrhoids: Secondary | ICD-10-CM | POA: Insufficient documentation

## 2014-05-18 DIAGNOSIS — Z8719 Personal history of other diseases of the digestive system: Secondary | ICD-10-CM | POA: Insufficient documentation

## 2014-05-18 DIAGNOSIS — F411 Generalized anxiety disorder: Secondary | ICD-10-CM | POA: Insufficient documentation

## 2014-05-18 MED ORDER — HYDROCORTISONE ACE-PRAMOXINE 1-1 % RE FOAM: 1.0000 | Freq: Two times a day (BID) | RECTAL | Status: DC

## 2014-05-18 MED ORDER — OXYCODONE-ACETAMINOPHEN 5-325 MG PO TABS: 1.0000 | ORAL_TABLET | Freq: Four times a day (QID) | ORAL | Status: DC | PRN

## 2014-05-18 MED ORDER — OXYCODONE-ACETAMINOPHEN 5-325 MG PO TABS
1.0000 | ORAL_TABLET | Freq: Once | ORAL | Status: AC
  Administered 2014-05-18: 1 via ORAL
  Filled 2014-05-18: qty 1

## 2014-05-18 NOTE — Discharge Instructions (Signed)
Return here as needed. Follow up with the doctor provided. Warm bath soaks.

## 2014-05-18 NOTE — ED Notes (Signed)
Bed: [facility]3 Expected date:  Expected time:  Means of arrival:  Comments: 

## 2014-05-18 NOTE — ED Provider Notes (Signed)
CSN: [id number]     Arrival date & time 05/18/14  2725 History   First MD Initiated Contact with Patient 05/18/14 2055     Chief Complaint  Patient presents with  . Rectal Pain     (Consider location/radiation/quality/duration/timing/severity/associated sxs/prior Treatment) HPI Patient presents to the emergency department with rectal pain over the last 2 days.  The patient, states, that has pain in the rectal area.  Patient, states, that she's been having normal bowel movements.  She states that she feels, like there is pain, just inside the anus.  Patient denies rectal bleeding, nausea, vomiting, diarrhea, headache, blurred vision, weakness, dizziness, abdominal pain, back pain, fever, or syncope.  The patient, states, that she did not take any medications prior to arrival.  Patient, states, that nothing seems make her condition, better, but using the bathroom makes her pain, worse and sitting makes her pain, worse Past Medical History  Diagnosis Date  . Hypertension   . GERD (gastroesophageal reflux disease)   . Asthma   . Anxiety   . Aplastic anemia    Past Surgical History  Procedure Laterality Date  . Abdominal hysterectomy    . Knee surgery      Right x 2  . Tunneled venous catheter placement     Family History  Problem Relation Age of Onset  . Coronary artery disease Father 16    CABG  . Cancer Father   . Hypertension Mother   . Cancer Mother    History  Substance Use Topics  . Smoking status: Current Every Day Smoker -- 0.50 packs/day for 22 years    Types: Cigarettes  . Smokeless tobacco: Never Used     Comment: doen not want to quit  . Alcohol Use: Yes   OB History   Grav Para Term Preterm Abortions TAB SAB Ect Mult Living                 Review of Systems  All other systems negative except as documented in the HPI. All pertinent positives and negatives as reviewed in the HPI.  Allergies  Review of patient's allergies indicates no known  allergies.  Home Medications   Prior to Admission medications   Medication Sig Start Date End Date Taking? Authorizing Provider  albuterol (PROVENTIL HFA;VENTOLIN HFA) 108 (90 BASE) MCG/ACT inhaler Inhale 2 puffs into the lungs every 6 (six) hours as needed. For shortness of breath.   Yes Historical Provider, MD  ALPRAZolam Duanne Moron) 1 MG tablet Take 1 mg by mouth 3 (three) times daily as needed for anxiety.   Yes Historical Provider, MD  budesonide-formoterol (SYMBICORT) 80-4.5 MCG/ACT inhaler Inhale 2 puffs into the lungs 2 (two) times daily as needed (wheezing).   Yes Historical Provider, MD   BP 121/78  Pulse 115  Temp(Src) 98.4 F (36.9 C) (Oral)  Resp 20  SpO2 96% Physical Exam  Nursing note and vitals reviewed. Constitutional: She is oriented to person, place, and time. She appears well-developed and well-nourished. No distress.  HENT:  Head: Normocephalic and atraumatic.  Mouth/Throat: Oropharynx is clear and moist.  Eyes: Pupils are equal, round, and reactive to light.  Neck: Normal range of motion. Neck supple.  Cardiovascular: Normal rate and regular rhythm.   Pulmonary/Chest: Effort normal and breath sounds normal. No respiratory distress.  Abdominal: Soft. Bowel sounds are normal. She exhibits no distension. There is no tenderness.  Genitourinary: Rectal exam shows external hemorrhoid, internal hemorrhoid and tenderness. Rectal exam shows no fissure, no mass  and anal tone normal. Guaiac negative stool.  Neurological: She is alert and oriented to person, place, and time.  Skin: Skin is warm and dry. No rash noted. No erythema.    ED Course  Procedures (including critical care time) Labs Review Labs Reviewed - No data to display   Patient be treated for an internal hemorrhoid and referred to GI the patient has tenderness on rectal examination, just inside the rectal vault.  I feel that there is some irregularities in the area that would be consistent with an internal  hemorrhoid.  Patient is advised to return here as needed.  Told to use warm sitz baths  Brent General, PA-C 05/19/14 [phone]

## 2014-05-18 NOTE — ED Notes (Signed)
Pt c/o rectal pain and pressure since Friday. Denies n/v/d. Denies blood in stool. Radiates to "L shoulder."

## 2014-05-21 NOTE — ED Provider Notes (Signed)
Medical screening examination/treatment/procedure(s) were performed by non-physician practitioner and as supervising physician I was immediately available for consultation/collaboration.   EKG Interpretation None        Derian Dimalanta T Milayna Rotenberg, MD 05/21/14 0705 

## 2014-06-02 ENCOUNTER — Emergency Department (HOSPITAL_COMMUNITY)
Admission: EM | Admit: 2014-06-02 | Discharge: 2014-06-02 | Disposition: A | Discharge status: Home / Self Care | Payer: Medicare Other | Attending: Emergency Medicine | Admitting: Emergency Medicine

## 2014-06-02 ENCOUNTER — Encounter (HOSPITAL_COMMUNITY): Payer: Self-pay | Admitting: Emergency Medicine

## 2014-06-02 DIAGNOSIS — R000 Tachycardia, unspecified: Secondary | ICD-10-CM | POA: Insufficient documentation

## 2014-06-02 DIAGNOSIS — K611 Rectal abscess: Secondary | ICD-10-CM

## 2014-06-02 DIAGNOSIS — K644 Residual hemorrhoidal skin tags: Secondary | ICD-10-CM | POA: Insufficient documentation

## 2014-06-02 DIAGNOSIS — Z79899 Other long term (current) drug therapy: Secondary | ICD-10-CM | POA: Insufficient documentation

## 2014-06-02 DIAGNOSIS — F17200 Nicotine dependence, unspecified, uncomplicated: Secondary | ICD-10-CM | POA: Insufficient documentation

## 2014-06-02 DIAGNOSIS — I10 Essential (primary) hypertension: Secondary | ICD-10-CM | POA: Insufficient documentation

## 2014-06-02 DIAGNOSIS — F411 Generalized anxiety disorder: Secondary | ICD-10-CM | POA: Insufficient documentation

## 2014-06-02 DIAGNOSIS — J45909 Unspecified asthma, uncomplicated: Secondary | ICD-10-CM | POA: Insufficient documentation

## 2014-06-02 DIAGNOSIS — K612 Anorectal abscess: Secondary | ICD-10-CM | POA: Insufficient documentation

## 2014-06-02 DIAGNOSIS — Z862 Personal history of diseases of the blood and blood-forming organs and certain disorders involving the immune mechanism: Secondary | ICD-10-CM | POA: Insufficient documentation

## 2014-06-02 MED ORDER — SULFAMETHOXAZOLE-TRIMETHOPRIM 800-160 MG PO TABS: 1.0000 | ORAL_TABLET | Freq: Two times a day (BID) | ORAL | Status: DC

## 2014-06-02 MED ORDER — ONDANSETRON HCL 4 MG/2ML IJ SOLN
4.0000 mg | Freq: Once | INTRAMUSCULAR | Status: AC
  Administered 2014-06-02: 4 mg via INTRAVENOUS
  Filled 2014-06-02: qty 2

## 2014-06-02 MED ORDER — OXYCODONE-ACETAMINOPHEN 5-325 MG PO TABS
1.0000 | ORAL_TABLET | Freq: Once | ORAL | Status: AC
  Administered 2014-06-02: 1 via ORAL
  Filled 2014-06-02: qty 1

## 2014-06-02 MED ORDER — SODIUM CHLORIDE 0.9 % IV BOLUS (SEPSIS)
500.0000 mL | Freq: Once | INTRAVENOUS | Status: AC
  Administered 2014-06-02: 500 mL via INTRAVENOUS

## 2014-06-02 MED ORDER — OXYCODONE-ACETAMINOPHEN 5-325 MG PO TABS: 1.0000 | ORAL_TABLET | Freq: Four times a day (QID) | ORAL | Status: DC | PRN

## 2014-06-02 MED ORDER — PROPOFOL 10 MG/ML IV BOLUS
0.5000 mg/kg | Freq: Once | INTRAVENOUS | Status: AC
  Administered 2014-06-02: 100 mg via INTRAVENOUS
  Filled 2014-06-02: qty 1

## 2014-06-02 MED ORDER — HYDROMORPHONE HCL PF 1 MG/ML IJ SOLN
1.0000 mg | Freq: Once | INTRAMUSCULAR | Status: AC
  Administered 2014-06-02: 1 mg via INTRAVENOUS
  Filled 2014-06-02: qty 1

## 2014-06-02 NOTE — ED Notes (Signed)
Pt with perirectal abscess requiring sedation for pain control during procedure  Blanchie Dessert, MD 06/02/14 [phone]

## 2014-06-02 NOTE — ED Notes (Addendum)
Pt arrived to the ED with a complaint of internal hemorrhoids.  Pt states she has not been able to afford her medication that was prescribed to her here.  Today her hemorrhoids became worse so she sought relief in the ED.  Pt states she feels something hard in her rectum that is different from the pain she has felt previously.  Pt is tachycardic in triage.

## 2014-06-02 NOTE — ED Provider Notes (Addendum)
CSN: [id number]     Arrival date & time 06/02/14  0559 History   First MD Initiated Contact with Patient 06/02/14 0701     Chief Complaint  Patient presents with  . Hemorrhoids     (Consider location/radiation/quality/duration/timing/severity/associated sxs/prior Treatment) HPI Comments: Pt with history of hemorrhoids he was seen at the end of May and then treatment which she did not use. Over the last 3 days she's had worsening severe pain in her rectum. She denies any fever, abdominal pain or change in bowel movements. She is unable to sit due to severe pain which is 10 out of 10. She denies any rectal bleeding but a mild burning sensation. She was still able to urinate and defecate  The history is provided by the patient.    Past Medical History  Diagnosis Date  . Hypertension   . GERD (gastroesophageal reflux disease)   . Asthma   . Anxiety   . Aplastic anemia    Past Surgical History  Procedure Laterality Date  . Abdominal hysterectomy    . Knee surgery      Right x 2  . Tunneled venous catheter placement     Family History  Problem Relation Age of Onset  . Coronary artery disease Father 58    CABG  . Cancer Father   . Hypertension Mother   . Cancer Mother    History  Substance Use Topics  . Smoking status: Current Every Day Smoker -- 0.50 packs/day for 22 years    Types: Cigarettes  . Smokeless tobacco: Never Used     Comment: doen not want to quit  . Alcohol Use: Yes   OB History   Grav Para Term Preterm Abortions TAB SAB Ect Mult Living                 Review of Systems  All other systems reviewed and are negative.     Allergies  Review of patient's allergies indicates no known allergies.  Home Medications   Prior to Admission medications   Medication Sig Start Date End Date Taking? Authorizing Provider  albuterol (PROVENTIL HFA;VENTOLIN HFA) 108 (90 BASE) MCG/ACT inhaler Inhale 2 puffs into the lungs every 6 (six) hours as needed. For shortness  of breath.   Yes Historical Provider, MD  ALPRAZolam Duanne Moron) 1 MG tablet Take 1 mg by mouth 3 (three) times daily as needed for anxiety.   Yes Historical Provider, MD  budesonide-formoterol (SYMBICORT) 80-4.5 MCG/ACT inhaler Inhale 2 puffs into the lungs 2 (two) times daily as needed (wheezing).   Yes Historical Provider, MD   BP 142/78  Pulse 136  Temp(Src) 98.6 F (37 C) (Oral)  Resp 22  SpO2 96% Physical Exam  Nursing note and vitals reviewed. Constitutional: She is oriented to person, place, and time. She appears well-developed and well-nourished. She appears distressed.  Appears uncomfortable  HENT:  Head: Normocephalic and atraumatic.  Eyes: EOM are normal. Pupils are equal, round, and reactive to light.  Cardiovascular: Tachycardia present.   Pulmonary/Chest: Effort normal.  Abdominal: Soft. She exhibits no distension. There is no tenderness. There is no rebound and no guarding.  Genitourinary: Rectal exam shows external hemorrhoid and tenderness.     Neurological: She is alert and oriented to person, place, and time.  Skin: Skin is warm and dry. No rash noted. No erythema.  Psychiatric: She has a normal mood and affect. Her behavior is normal.    ED Course  Procedures (including critical care time) Labs  Review Labs Reviewed - No data to display  Imaging Review No results found.  Procedural sedation Performed by: Blanchie Dessert Consent: Verbal consent obtained. Risks and benefits: risks, benefits and alternatives were discussed Required items: required blood products, implants, devices, and special equipment available Patient identity confirmed: arm band and provided demographic data Time out: Immediately prior to procedure a "time out" was called to verify the correct patient, procedure, equipment, support staff and site/side marked as required.  Sedation type: moderate (conscious) sedation NPO time confirmed and considedered  Sedatives: PROPOFOL  Physician  Time at Bedside: 30  Vitals: Vital signs were monitored during sedation. Cardiac Monitor, pulse oximeter Patient tolerance: Patient tolerated the procedure well with no immediate complications. Comments: Pt with uneventful recovered. Returned to pre-procedural sedation baseline  INCISION AND DRAINAGE Performed by: Blanchie Dessert Consent: Verbal consent obtained. Risks and benefits: risks, benefits and alternatives were discussed Type: abscess  Body area: rectal  Anesthesia: local infiltration  Incision was made with a scalpel.  Local anesthetic: lidocaine 1% without epinephrine  Anesthetic total: 4 ml  Complexity: complex Blunt dissection to break up loculations  Drainage: purulent  Drainage amount: 33mL  Packing material: none Patient tolerance: Patient tolerated the procedure well with no immediate complications.      MDM   Final diagnoses:  Perirectal abscess    Patient with a history and hemorrhoids who returns today with severe pain. She has tenderness and induration of external hemorrhoids concerning infection versus thrombosis. There is no surrounding cellulitis. Pt is in severe pain. Will do conscious sedation for I&D.  8:39 AM Pt sedated wtihout difficulty and large amt of puss drained from perirectal abscess. Pt placed on bactrim and will d/c home with sitz baths, pain control and general surgery f/u.  9:27 AM Pt feeling better after procedure.  Tachycardia has improved but between 105-110.  Blanchie Dessert, MD 06/02/14 8315  Blanchie Dessert, MD 06/02/14 0930

## 2014-06-02 NOTE — Discharge Instructions (Signed)
Peri-Rectal Abscess °Your caregiver has diagnosed you as having a peri-rectal abscess. This is an infected area near the rectum that is filled with pus. If the abscess is near the surface of the skin, your caregiver may open (incise) the area and drain the pus. °HOME CARE INSTRUCTIONS  °· If your abscess was opened up and drained. A small piece of gauze may be placed in the opening so that it can drain. Do not remove the gauze unless directed by your caregiver. °· A loose dressing may be placed over the abscess site. Change the dressing as often as necessary to keep it clean and dry. °· After the drain is removed, the area may be washed with a gentle antiseptic (soap) four times per day. °· A warm sitz bath, warm packs or heating pad may be used for pain relief, taking care not to burn yourself. °· Return for a wound check in 1 day or as directed. °· An "inflatable doughnut" may be used for sitting with added comfort. These can be purchased at a drugstore or medical supply house. °· To reduce pain and straining with bowel movements, eat a high fiber diet with plenty of fruits and vegetables. Use stool softeners as recommended by your caregiver. This is especially important if narcotic type pain medications were prescribed as these may cause marked constipation. °· Only take over-the-counter or prescription medicines for pain, discomfort, or fever as directed by your caregiver. °SEEK IMMEDIATE MEDICAL CARE IF:  °· You have increasing pain that is not controlled by medication. °· There is increased inflammation (redness), swelling, bleeding, or drainage from the area. °· An oral temperature above 102° F (38.9° C) develops. °· You develop chills or generalized malaise (feel lethargic or feel "washed out"). °· You develop any new symptoms (problems) you feel may be related to your present problem. °Document Released: 12/02/2000 Document Revised: 02/27/2012 Document Reviewed: 12/02/2008 °ExitCare® Patient Information  ©2014 [employer]. ° °

## 2014-07-03 ENCOUNTER — Telehealth: Payer: Self-pay | Admitting: Hematology & Oncology

## 2014-07-03 NOTE — Telephone Encounter (Signed)
Patient called and cx 07/07/14 apt and stated she will call back to resch

## 2014-07-07 ENCOUNTER — Ambulatory Visit: Payer: Medicare Other | Admitting: Hematology & Oncology

## 2014-07-07 ENCOUNTER — Other Ambulatory Visit: Payer: Medicare Other | Admitting: Lab

## 2014-07-21 ENCOUNTER — Other Ambulatory Visit: Payer: Self-pay | Admitting: Hematology & Oncology

## 2014-08-12 ENCOUNTER — Telehealth: Payer: Self-pay | Admitting: Hematology & Oncology

## 2014-08-12 NOTE — Telephone Encounter (Signed)
Patient called and resch 07/07/14 missed apt to 09/18/14

## 2014-08-14 ENCOUNTER — Other Ambulatory Visit: Payer: Self-pay | Admitting: Hematology & Oncology

## 2014-08-19 ENCOUNTER — Other Ambulatory Visit: Payer: Self-pay | Admitting: Hematology & Oncology

## 2014-08-19 ENCOUNTER — Other Ambulatory Visit: Payer: Self-pay | Admitting: *Deleted

## 2014-08-19 ENCOUNTER — Encounter: Payer: Self-pay | Admitting: *Deleted

## 2014-08-19 NOTE — Progress Notes (Unsigned)
Pt called to report she is going to [facility] soon and was trying to get her medications refilled before she goes.  States she was trying to do this over the internet but the pharmacy did not receive her request for Xanax refill.   Told her according to our records her Xanax had been refilled as of today.  She will call her pharmacy again.

## 2014-08-20 ENCOUNTER — Other Ambulatory Visit: Payer: Self-pay | Admitting: *Deleted

## 2014-08-20 NOTE — Telephone Encounter (Signed)
Erroneous encounter.  Refill already done.

## 2014-09-15 ENCOUNTER — Telehealth: Payer: Self-pay | Admitting: Hematology & Oncology

## 2014-09-15 NOTE — Telephone Encounter (Signed)
Pt moved 10-1 to 10-14

## 2014-09-18 ENCOUNTER — Ambulatory Visit: Payer: Medicare Other | Admitting: Hematology & Oncology

## 2014-09-18 ENCOUNTER — Other Ambulatory Visit: Payer: Medicare Other | Admitting: Lab

## 2014-09-23 ENCOUNTER — Other Ambulatory Visit: Payer: Self-pay

## 2014-09-23 DIAGNOSIS — Z1231 Encounter for screening mammogram for malignant neoplasm of breast: Secondary | ICD-10-CM

## 2014-10-01 ENCOUNTER — Encounter: Payer: Self-pay | Admitting: Hematology & Oncology

## 2014-10-01 ENCOUNTER — Ambulatory Visit (HOSPITAL_BASED_OUTPATIENT_CLINIC_OR_DEPARTMENT_OTHER): Payer: Medicare Other | Admitting: Hematology & Oncology

## 2014-10-01 ENCOUNTER — Other Ambulatory Visit (HOSPITAL_BASED_OUTPATIENT_CLINIC_OR_DEPARTMENT_OTHER): Payer: Medicare Other | Admitting: Lab

## 2014-10-01 VITALS — BP 175/94 | HR 109 | Temp 98.2°F | Resp 16 | Ht 63.0 in | Wt 162.0 lb

## 2014-10-01 DIAGNOSIS — I10 Essential (primary) hypertension: Secondary | ICD-10-CM

## 2014-10-01 DIAGNOSIS — Z862 Personal history of diseases of the blood and blood-forming organs and certain disorders involving the immune mechanism: Secondary | ICD-10-CM

## 2014-10-01 DIAGNOSIS — F419 Anxiety disorder, unspecified: Secondary | ICD-10-CM

## 2014-10-01 DIAGNOSIS — F064 Anxiety disorder due to known physiological condition: Secondary | ICD-10-CM

## 2014-10-01 LAB — CBC WITH DIFFERENTIAL (CANCER CENTER ONLY)
BASO#: 0 10*3/uL (ref 0.0–0.2)
BASO%: 0.3 % (ref 0.0–2.0)
EOS%: 1.9 % (ref 0.0–7.0)
Eosinophils Absolute: 0.1 10*3/uL (ref 0.0–0.5)
HEMATOCRIT: 44.1 % (ref 34.8–46.6)
HEMOGLOBIN: 15.3 g/dL (ref 11.6–15.9)
LYMPH#: 2.6 10*3/uL (ref 0.9–3.3)
LYMPH%: 40.8 % (ref 14.0–48.0)
MCH: 34.2 pg — AB (ref 26.0–34.0)
MCHC: 34.7 g/dL (ref 32.0–36.0)
MCV: 98 fL (ref 81–101)
MONO#: 0.7 10*3/uL (ref 0.1–0.9)
MONO%: 11.4 % (ref 0.0–13.0)
NEUT#: 2.9 10*3/uL (ref 1.5–6.5)
NEUT%: 45.6 % (ref 39.6–80.0)
PLATELETS: 125 10*3/uL — AB (ref 145–400)
RBC: 4.48 10*6/uL (ref 3.70–5.32)
RDW: 12.8 % (ref 11.1–15.7)
WBC: 6.3 10*3/uL (ref 3.9–10.0)

## 2014-10-01 MED ORDER — ALPRAZOLAM 1 MG PO TABS: 1.0000 mg | ORAL_TABLET | Freq: Three times a day (TID) | ORAL | Status: DC | PRN

## 2014-10-01 MED ORDER — OLMESARTAN MEDOXOMIL-HCTZ 20-12.5 MG PO TABS: 1.0000 | ORAL_TABLET | Freq: Every day | ORAL | Status: DC

## 2014-10-01 NOTE — Patient Instructions (Signed)
Smoking Cessation, Tips for Success  If you are ready to quit smoking, congratulations! You have chosen to help yourself be healthier. Cigarettes bring nicotine, tar, carbon monoxide, and other irritants into your body. Your lungs, heart, and blood vessels will be able to work better without these poisons. There are many different ways to quit smoking. Nicotine gum, nicotine patches, a nicotine inhaler, or nicotine nasal spray can help with physical craving. Hypnosis, support groups, and medicines help break the habit of smoking.  WHAT THINGS CAN I DO TO MAKE QUITTING EASIER?   Here are some tips to help you quit for good:  · Pick a date when you will quit smoking completely. Tell all of your friends and family about your plan to quit on that date.  · Do not try to slowly cut down on the number of cigarettes you are smoking. Pick a quit date and quit smoking completely starting on that day.  · Throw away all cigarettes.    · Clean and remove all ashtrays from your home, work, and car.  · On a card, write down your reasons for quitting. Carry the card with you and read it when you get the urge to smoke.  · Cleanse your body of nicotine. Drink enough water and fluids to keep your urine clear or pale yellow. Do this after quitting to flush the nicotine from your body.  · Learn to predict your moods. Do not let a bad situation be your excuse to have a cigarette. Some situations in your life might tempt you into wanting a cigarette.  · Never have "just one" cigarette. It leads to wanting another and another. Remind yourself of your decision to quit.  · Change habits associated with smoking. If you smoked while driving or when feeling stressed, try other activities to replace smoking. Stand up when drinking your coffee. Brush your teeth after eating. Sit in a different chair when you read the paper. Avoid alcohol while trying to quit, and try to drink fewer caffeinated beverages. Alcohol and caffeine may urge you to  smoke.  · Avoid foods and drinks that can trigger a desire to smoke, such as sugary or spicy foods and alcohol.  · Ask people who smoke not to smoke around you.  · Have something planned to do right after eating or having a cup of coffee. For example, plan to take a walk or exercise.  · Try a relaxation exercise to calm you down and decrease your stress. Remember, you may be tense and nervous for the first 2 weeks after you quit, but this will pass.  · Find new activities to keep your hands busy. Play with a pen, coin, or rubber band. Doodle or draw things on paper.  · Brush your teeth right after eating. This will help cut down on the craving for the taste of tobacco after meals. You can also try mouthwash.    · Use oral substitutes in place of cigarettes. Try using lemon drops, carrots, cinnamon sticks, or chewing gum. Keep them handy so they are available when you have the urge to smoke.  · When you have the urge to smoke, try deep breathing.  · Designate your home as a nonsmoking area.  · If you are a heavy smoker, ask your health care provider about a prescription for nicotine chewing gum. It can ease your withdrawal from nicotine.  · Reward yourself. Set aside the cigarette money you save and buy yourself something nice.  · Look for   support from others. Join a support group or smoking cessation program. Ask someone at home or at work to help you with your plan to quit smoking.  · Always ask yourself, "Do I need this cigarette or is this just a reflex?" Tell yourself, "Today, I choose not to smoke," or "I do not want to smoke." You are reminding yourself of your decision to quit.  · Do not replace cigarette smoking with electronic cigarettes (commonly called e-cigarettes). The safety of e-cigarettes is unknown, and some may contain harmful chemicals.  · If you relapse, do not give up! Plan ahead and think about what you will do the next time you get the urge to smoke.  HOW WILL I FEEL WHEN I QUIT SMOKING?  You  may have symptoms of withdrawal because your body is used to nicotine (the addictive substance in cigarettes). You may crave cigarettes, be irritable, feel very hungry, cough often, get headaches, or have difficulty concentrating. The withdrawal symptoms are only temporary. They are strongest when you first quit but will go away within 10-14 days. When withdrawal symptoms occur, stay in control. Think about your reasons for quitting. Remind yourself that these are signs that your body is healing and getting used to being without cigarettes. Remember that withdrawal symptoms are easier to treat than the major diseases that smoking can cause.   Even after the withdrawal is over, expect periodic urges to smoke. However, these cravings are generally short lived and will go away whether you smoke or not. Do not smoke!  WHAT RESOURCES ARE AVAILABLE TO HELP ME QUIT SMOKING?  Your health care provider can direct you to community resources or hospitals for support, which may include:  · Group support.  · Education.  · Hypnosis.  · Therapy.  Document Released: 09/02/2004 Document Revised: 04/21/2014 Document Reviewed: 05/23/2013  ExitCare® Patient Information ©2015 [employer]. This information is not intended to replace advice given to you by your health care provider. Make sure you discuss any questions you have with your health care provider.

## 2014-10-02 NOTE — Progress Notes (Signed)
Hematology and Oncology Follow Up Visit  CATHELINE Edwards [id number] [birth year] 48 y.o. 10/02/2014   Principle Diagnosis:  1. Aplastic anemia - clinical remission. 2. Chronic anxiety. 3. Hypertension.  Current Therapy:    Observation     Interim History:  Ms.  Edwards is for followup. We last saw her back in April. Wendy Edwards, unfortunately still not yet see her family doctor. Wendy Edwards is still smoking. Thank you, her friend came in with her. Her friend will make sure that Wendy Edwards sees a doctor. Her friend will get her to see her family doctor.  [name] blood pressure issues. Again, Wendy Edwards's not taking any blood pressure medication. I will put her on Benicar/hydrochlorothiazide.  Wendy Edwards still still with anxiety.  Wendy Edwards's had occasional headaches. Wendy Edwards's had no nausea or vomiting. Wendy Edwards's had no change in bowel or bladder habits.  I don't think Wendy Edwards's had a mammogram for 5 years. I will need to make sure that Wendy Edwards has this set up.  Wendy Edwards's had no rashes. Wendy Edwards's had no leg swelling.  Medications: Current outpatient prescriptions:ALPRAZolam (XANAX) 1 MG tablet, Take 1 tablet (1 mg total) by mouth 3 (three) times daily as needed for anxiety., Disp: 90 tablet, Rfl: 1;  Ibuprofen 200 MG CAPS, Take by mouth as needed., Disp: , Rfl: ;  PROAIR HFA 108 (90 BASE) MCG/ACT inhaler, TAKE 2 PUFFS EVERY 6 HOURS AS NEEDED, Disp: 8.5 each, Rfl: 4 olmesartan-hydrochlorothiazide (BENICAR HCT) 20-12.5 MG per tablet, Take 1 tablet by mouth daily., Disp: 30 tablet, Rfl: 3  Allergies: No Known Allergies  Past Medical History, Surgical history, Social history, and Family History were reviewed and updated.  Review of Systems: As above  Physical Exam:  height is 5\' 3"  (1.6 m) and weight is 162 lb (73.483 kg). Her oral temperature is 98.2 F (36.8 C). Her blood pressure is 175/94 and her pulse is 109. Her respiration is 16.   Well-developed well-nourished white female. Head and neck exam shows no ocular or oral lesion pressure is no  palpable cervical or supraclavicular lymph nodes. Lungs are clear. Cardiac exam regular in rhythm. Abdomen soft. Wendy Edwards has good bowel sounds. There is no fluid wave. There is no palpable liver or spleen tip. Extremities shows no clubbing, cyanosis or edema. Skin exam no rashes, ecchymosis or petechia. Neurological exam is nonfocal.  Lab Results  Component Value Date   WBC 6.3 10/01/2014   HGB 15.3 10/01/2014   HCT 44.1 10/01/2014   MCV 98 10/01/2014   PLT 125* 10/01/2014     Chemistry      Component Value Date/Time   NA 138 07/31/2013 1232   NA 144 12/24/2010 1126   K 4.4 07/31/2013 1232   K 4.3 12/24/2010 1126   CL 106 07/31/2013 1232   CL 103 12/24/2010 1126   CO2 24 07/31/2013 1232   CO2 27 12/24/2010 1126   BUN 8 07/31/2013 1232   BUN 5* 12/24/2010 1126   CREATININE 0.74 07/31/2013 1232   CREATININE 0.4* 12/24/2010 1126      Component Value Date/Time   CALCIUM 9.6 07/31/2013 1232   CALCIUM 9.2 12/24/2010 1126   ALKPHOS 70 07/31/2013 1232   AST 22 07/31/2013 1232   ALT 24 07/31/2013 1232   BILITOT 0.3 07/31/2013 1232         Impression and Plan: Wendy Edwards is 48 year old white female. Wendy Edwards has a remote history of aplastic anemia. Wendy Edwards was treated with immunosuppression therapy with anti-thrombocyte globulin. This probably was 15 years ago.  Her  platelet count is down a little bit. Wendy Edwards's been trending downward a little slowly. Otherwise, the rest her blood counts are doing okay.  Note that her blood smear. I do not see anything that looked suspicious on her blood smear. Wendy Edwards can maturation of her white blood cells. There were no nucleated red cells. There were no schistocytes.  I spent over a half hour with her. Again I told her that Wendy Edwards really had to see a family doctor. I will do her favor and give her the blood pressure medicine. I don't want to see her have a stroke. Carotid for her to take one baby aspirin a day. Hopefully Wendy Edwards will do this.  I told her Wendy Edwards also needs to take 2000 units of  vitamin D.  We will get a mammogram also.  I want to see her back in about 3 months. I want to make sure that we will followup on the platelets.   [name], MD 10/15/20157:02 AM

## 2014-10-07 ENCOUNTER — Ambulatory Visit
Admission: RE | Admit: 2014-10-07 | Discharge: 2014-10-07 | Disposition: A | Discharge status: Home / Self Care | Payer: Medicare Other | Source: Ambulatory Visit

## 2014-10-07 DIAGNOSIS — Z1231 Encounter for screening mammogram for malignant neoplasm of breast: Secondary | ICD-10-CM

## 2014-12-02 ENCOUNTER — Telehealth: Payer: Self-pay | Admitting: Hematology & Oncology

## 2014-12-02 NOTE — Telephone Encounter (Signed)
Pt moved 12-16 to 1-4

## 2014-12-03 ENCOUNTER — Other Ambulatory Visit: Payer: Medicare Other | Admitting: Lab

## 2014-12-03 ENCOUNTER — Ambulatory Visit: Payer: Medicare Other | Admitting: Hematology & Oncology

## 2014-12-08 ENCOUNTER — Other Ambulatory Visit: Payer: Self-pay | Admitting: Hematology & Oncology

## 2014-12-22 ENCOUNTER — Other Ambulatory Visit: Payer: Medicare Other | Admitting: Lab

## 2014-12-22 ENCOUNTER — Ambulatory Visit: Payer: Medicare Other | Admitting: Hematology & Oncology

## 2015-01-06 ENCOUNTER — Telehealth: Payer: Self-pay | Admitting: Hematology & Oncology

## 2015-01-06 NOTE — Telephone Encounter (Signed)
Patient called and resch 12/22/14 apt to 01/22/15

## 2015-01-22 ENCOUNTER — Ambulatory Visit (HOSPITAL_BASED_OUTPATIENT_CLINIC_OR_DEPARTMENT_OTHER)
Admission: RE | Admit: 2015-01-22 | Discharge: 2015-01-22 | Disposition: A | Discharge status: Home / Self Care | Payer: Medicare Other | Source: Ambulatory Visit | Attending: Hematology & Oncology | Admitting: Hematology & Oncology

## 2015-01-22 ENCOUNTER — Other Ambulatory Visit (HOSPITAL_BASED_OUTPATIENT_CLINIC_OR_DEPARTMENT_OTHER): Payer: Medicare Other | Admitting: Lab

## 2015-01-22 ENCOUNTER — Ambulatory Visit (HOSPITAL_BASED_OUTPATIENT_CLINIC_OR_DEPARTMENT_OTHER): Payer: Medicare Other | Admitting: Hematology & Oncology

## 2015-01-22 ENCOUNTER — Encounter: Payer: Self-pay | Admitting: Hematology & Oncology

## 2015-01-22 VITALS — BP 132/77 | HR 102 | Temp 98.1°F | Resp 14 | Ht 63.0 in | Wt 161.0 lb

## 2015-01-22 DIAGNOSIS — F064 Anxiety disorder due to known physiological condition: Secondary | ICD-10-CM | POA: Diagnosis not present

## 2015-01-22 DIAGNOSIS — R05 Cough: Secondary | ICD-10-CM | POA: Insufficient documentation

## 2015-01-22 DIAGNOSIS — D619 Aplastic anemia, unspecified: Secondary | ICD-10-CM | POA: Diagnosis not present

## 2015-01-22 DIAGNOSIS — I10 Essential (primary) hypertension: Secondary | ICD-10-CM

## 2015-01-22 DIAGNOSIS — M25511 Pain in right shoulder: Secondary | ICD-10-CM

## 2015-01-22 DIAGNOSIS — Z720 Tobacco use: Secondary | ICD-10-CM | POA: Insufficient documentation

## 2015-01-22 DIAGNOSIS — F419 Anxiety disorder, unspecified: Secondary | ICD-10-CM

## 2015-01-22 DIAGNOSIS — R053 Chronic cough: Secondary | ICD-10-CM

## 2015-01-22 DIAGNOSIS — Z862 Personal history of diseases of the blood and blood-forming organs and certain disorders involving the immune mechanism: Secondary | ICD-10-CM

## 2015-01-22 LAB — CBC WITH DIFFERENTIAL (CANCER CENTER ONLY)
BASO#: 0 10*3/uL (ref 0.0–0.2)
BASO%: 0.3 % (ref 0.0–2.0)
EOS ABS: 0.1 10*3/uL (ref 0.0–0.5)
EOS%: 3.4 % (ref 0.0–7.0)
HEMATOCRIT: 39.4 % (ref 34.8–46.6)
HGB: 13.5 g/dL (ref 11.6–15.9)
LYMPH#: 1.5 10*3/uL (ref 0.9–3.3)
LYMPH%: 42.9 % (ref 14.0–48.0)
MCH: 34.5 pg — AB (ref 26.0–34.0)
MCHC: 34.3 g/dL (ref 32.0–36.0)
MCV: 101 fL (ref 81–101)
MONO#: 0.4 10*3/uL (ref 0.1–0.9)
MONO%: 12.4 % (ref 0.0–13.0)
NEUT#: 1.5 10*3/uL (ref 1.5–6.5)
NEUT%: 41 % (ref 39.6–80.0)
Platelets: 104 10*3/uL — ABNORMAL LOW (ref 145–400)
RBC: 3.91 10*6/uL (ref 3.70–5.32)
RDW: 13.5 % (ref 11.1–15.7)
WBC: 3.5 10*3/uL — AB (ref 3.9–10.0)

## 2015-01-22 LAB — BASIC METABOLIC PANEL
BUN: 7 mg/dL (ref 6–23)
CALCIUM: 9.3 mg/dL (ref 8.4–10.5)
CO2: 24 mEq/L (ref 19–32)
CREATININE: 0.62 mg/dL (ref 0.50–1.10)
Chloride: 102 mEq/L (ref 96–112)
Glucose, Bld: 76 mg/dL (ref 70–99)
POTASSIUM: 4.6 meq/L (ref 3.5–5.3)
SODIUM: 140 meq/L (ref 135–145)

## 2015-01-22 LAB — CHCC SATELLITE - SMEAR

## 2015-01-22 MED ORDER — ALPRAZOLAM 1 MG PO TABS: ORAL_TABLET | ORAL | Status: DC

## 2015-01-22 NOTE — Progress Notes (Signed)
Hematology and Oncology Follow Up Visit  Wendy Edwards [id number] [birth year] 49 y.o. 01/22/2015   Principle Diagnosis:  1. Aplastic anemia - clinical remission. 2. Chronic anxiety. 3. Hypertension.  Current Therapy:    Observation     Interim History:  Wendy Edwards is for followup. We last saw her back in October. She now is on blood pressure medicine. She does see a family doctor in [place].  Her problem has been pain in the right shoulder. This is in the posterior right shoulder. She has difficulty with full range of motion. She denies any trauma. She probably needs x-rays.  She still smoking. She's had a little bit of a cough. I think a chest x-ray is also needed.  She's had no bleeding. She's had no fever. She's had no nausea or vomiting. Her weight is holding pretty steady.  There's been no change in bowel or bladder habits.  She's had occasional headache. She does have a lot of anxiety. Xanax seems to help this.  She's had no leg swelling. She had no rashes.  Overall, her performance status is ECOG 1. Medications:  Current outpatient prescriptions:  .  ALPRAZolam (XANAX) 1 MG tablet, TAKE 1 TABLET BY MOUTH 3 TIMES A DAY AS NEEDED FOR ANXIETY, Disp: 90 tablet, Rfl: 1 .  Diclofenac Sodium 2 % SOLN, Place 2 Act onto the skin as needed. , Disp: , Rfl:  .  Ibuprofen 200 MG CAPS, Take by mouth as needed., Disp: , Rfl:  .  PROAIR HFA 108 (90 BASE) MCG/ACT inhaler, TAKE 2 PUFFS EVERY 6 HOURS AS NEEDED, Disp: 8.5 each, Rfl: 4 .  valsartan (DIOVAN) 80 MG tablet, Take 80 mg by mouth., Disp: , Rfl:   Allergies: No Known Allergies  Past Medical History, Surgical history, Social history, and Family History were reviewed and updated.  Review of Systems: As above  Physical Exam:  height is 5\' 3"  (1.6 m) and weight is 161 lb (73.029 kg). Her oral temperature is 98.1 F (36.7 C). Her blood pressure is 132/77 and her pulse is 102. Her respiration is 14.   Well-developed  well-nourished white female. Head and neck exam shows no ocular or oral lesion. There is no palpable cervical or supraclavicular lymph nodes. Lungs are clear. She has no wheezes or rhonchi. Cardiac exam regular rate and rhythm with no murmurs, rubs or bruits.. Abdomen is soft. She has good bowel sounds. There is no fluid wave. There is no palpable liver or spleen tip. Extremities shows no clubbing, cyanosis or edema. She has decreased range of motion of the right shoulder. There is some point tenderness in the posterior right shoulder. Skin exam shows no rashes, ecchymosis or petechia. Neurological exam is nonfocal.  Lab Results  Component Value Date   WBC 3.5* 01/22/2015   HGB 13.5 01/22/2015   HCT 39.4 01/22/2015   MCV 101 01/22/2015   PLT 104* 01/22/2015     Chemistry      Component Value Date/Time   NA 138 07/31/2013 1232   NA 144 12/24/2010 1126   K 4.4 07/31/2013 1232   K 4.3 12/24/2010 1126   CL 106 07/31/2013 1232   CL 103 12/24/2010 1126   CO2 24 07/31/2013 1232   CO2 27 12/24/2010 1126   BUN 8 07/31/2013 1232   BUN 5* 12/24/2010 1126   CREATININE 0.74 07/31/2013 1232   CREATININE 0.4* 12/24/2010 1126      Component Value Date/Time   CALCIUM 9.6 07/31/2013 1232  CALCIUM 9.2 12/24/2010 1126   ALKPHOS 70 07/31/2013 1232   AST 22 07/31/2013 1232   ALT 24 07/31/2013 1232   BILITOT 0.3 07/31/2013 1232         Impression and Plan: Wendy Edwards is 49 year old white female. She has a remote history of aplastic anemia. She was treated with immunosuppression therapy with anti-thrombocyte globulin. This probably was 15 years ago.  I am somewhat troubled by the fact that her plate count continues to decline. I looked at her blood smear. I really do not see anything that looked suspicious. I do not see any nucleated red cells. I do not see any immature myeloid cells. She has no atypical lymphocytes. There is no hypersegmented polys.  Her white cell count is also a little bit  lower.  I suppose that medications might be implicated. However, I will just watch her for right now as she is a systematic.  Her big problem right now is the right shoulder pain. This sounds like this might be a rotator cuff. However, I will get plain films of her shoulder and a chest x-ray. She does smoke. She is at higher risk for lung cancer because of her smoking and her past immune no suppressive therapy for the aplastic anemia. If the x-rays are negative, then I will refer her to orthopedic surgery for an evaluation.  I want to see her back in 6 weeks. I told her that we may have to do a bone marrow test on her. She is certainly at risk for myelodysplasia or possibly even leukemia given her past aplastic anemia history and intensive therapy.  I spent about 35-40 minutes with her today. [name], MD 2/4/20165:08 PM

## 2015-02-04 ENCOUNTER — Other Ambulatory Visit: Payer: Self-pay | Admitting: Nurse Practitioner

## 2015-02-04 DIAGNOSIS — M25511 Pain in right shoulder: Secondary | ICD-10-CM

## 2015-02-04 DIAGNOSIS — R053 Chronic cough: Secondary | ICD-10-CM

## 2015-02-04 DIAGNOSIS — Z862 Personal history of diseases of the blood and blood-forming organs and certain disorders involving the immune mechanism: Secondary | ICD-10-CM

## 2015-02-04 DIAGNOSIS — R05 Cough: Secondary | ICD-10-CM

## 2015-02-04 DIAGNOSIS — F064 Anxiety disorder due to known physiological condition: Secondary | ICD-10-CM

## 2015-02-04 MED ORDER — ALPRAZOLAM 1 MG PO TABS: ORAL_TABLET | ORAL | Status: DC

## 2015-02-06 ENCOUNTER — Other Ambulatory Visit: Payer: Self-pay | Admitting: Emergency Medicine

## 2015-03-06 ENCOUNTER — Other Ambulatory Visit (HOSPITAL_BASED_OUTPATIENT_CLINIC_OR_DEPARTMENT_OTHER): Payer: Medicare Other | Admitting: Lab

## 2015-03-06 ENCOUNTER — Encounter: Payer: Self-pay | Admitting: Hematology & Oncology

## 2015-03-06 ENCOUNTER — Ambulatory Visit (HOSPITAL_BASED_OUTPATIENT_CLINIC_OR_DEPARTMENT_OTHER): Payer: Medicare Other | Admitting: Hematology & Oncology

## 2015-03-06 VITALS — BP 135/81 | HR 90 | Temp 98.4°F | Resp 14 | Ht 63.0 in | Wt 158.0 lb

## 2015-03-06 DIAGNOSIS — Z862 Personal history of diseases of the blood and blood-forming organs and certain disorders involving the immune mechanism: Secondary | ICD-10-CM

## 2015-03-06 DIAGNOSIS — J4520 Mild intermittent asthma, uncomplicated: Secondary | ICD-10-CM

## 2015-03-06 DIAGNOSIS — R05 Cough: Secondary | ICD-10-CM

## 2015-03-06 DIAGNOSIS — F064 Anxiety disorder due to known physiological condition: Secondary | ICD-10-CM

## 2015-03-06 DIAGNOSIS — R053 Chronic cough: Secondary | ICD-10-CM

## 2015-03-06 DIAGNOSIS — J9801 Acute bronchospasm: Secondary | ICD-10-CM

## 2015-03-06 DIAGNOSIS — M25511 Pain in right shoulder: Secondary | ICD-10-CM

## 2015-03-06 LAB — LACTATE DEHYDROGENASE: LDH: 214 U/L (ref 94–250)

## 2015-03-06 LAB — CHCC SATELLITE - SMEAR

## 2015-03-06 LAB — COMPREHENSIVE METABOLIC PANEL
ALBUMIN: 3.6 g/dL (ref 3.5–5.2)
ALK PHOS: 73 U/L (ref 39–117)
ALT: 54 U/L — AB (ref 0–35)
AST: 33 U/L (ref 0–37)
BUN: 4 mg/dL — ABNORMAL LOW (ref 6–23)
CALCIUM: 8.5 mg/dL (ref 8.4–10.5)
CHLORIDE: 103 meq/L (ref 96–112)
CO2: 22 mEq/L (ref 19–32)
Creatinine, Ser: 0.43 mg/dL — ABNORMAL LOW (ref 0.50–1.10)
Glucose, Bld: 80 mg/dL (ref 70–99)
Potassium: 4 mEq/L (ref 3.5–5.3)
SODIUM: 139 meq/L (ref 135–145)
TOTAL PROTEIN: 7 g/dL (ref 6.0–8.3)
Total Bilirubin: 0.3 mg/dL (ref 0.2–1.2)

## 2015-03-06 LAB — CBC WITH DIFFERENTIAL (CANCER CENTER ONLY)
BASO#: 0 10*3/uL (ref 0.0–0.2)
BASO%: 0.3 % (ref 0.0–2.0)
EOS ABS: 0.1 10*3/uL (ref 0.0–0.5)
EOS%: 2.2 % (ref 0.0–7.0)
HCT: 37.2 % (ref 34.8–46.6)
HGB: 13.4 g/dL (ref 11.6–15.9)
LYMPH#: 1.9 10*3/uL (ref 0.9–3.3)
LYMPH%: 28.5 % (ref 14.0–48.0)
MCH: 35.4 pg — AB (ref 26.0–34.0)
MCHC: 36 g/dL (ref 32.0–36.0)
MCV: 98 fL (ref 81–101)
MONO#: 1 10*3/uL — ABNORMAL HIGH (ref 0.1–0.9)
MONO%: 15.1 % — ABNORMAL HIGH (ref 0.0–13.0)
NEUT#: 3.5 10*3/uL (ref 1.5–6.5)
NEUT%: 53.9 % (ref 39.6–80.0)
RBC: 3.79 10*6/uL (ref 3.70–5.32)
RDW: 13.3 % (ref 11.1–15.7)
WBC: 6.5 10*3/uL (ref 3.9–10.0)

## 2015-03-06 LAB — RETICULOCYTES (CHCC)
ABS RETIC: 74.1 10*3/uL (ref 19.0–186.0)
RBC.: 3.9 MIL/uL (ref 3.87–5.11)
Retic Ct Pct: 1.9 % (ref 0.4–2.3)

## 2015-03-06 MED ORDER — IPRATROPIUM-ALBUTEROL 0.5-2.5 (3) MG/3ML IN SOLN: 3.0000 mL | Freq: Once | RESPIRATORY_TRACT | Status: DC

## 2015-03-06 MED ORDER — HYDROCOD POLST-CHLORPHEN POLST 10-8 MG/5ML PO LQCR: 5.0000 mL | Freq: Two times a day (BID) | ORAL | Status: DC | PRN

## 2015-03-06 MED ORDER — CEFDINIR 300 MG PO CAPS: 300.0000 mg | ORAL_CAPSULE | Freq: Two times a day (BID) | ORAL | Status: DC

## 2015-03-06 MED ORDER — METHYLPREDNISOLONE 4 MG PO KIT: PACK | ORAL | Status: DC

## 2015-03-06 MED ORDER — ALBUTEROL SULFATE (5 MG/ML) 0.5% IN NEBU
2.5000 mg | INHALATION_SOLUTION | Freq: Once | RESPIRATORY_TRACT | Status: AC
  Administered 2015-03-06: 2.5 mg via RESPIRATORY_TRACT
  Filled 2015-03-06: qty 0.5

## 2015-03-06 MED ORDER — IPRATROPIUM BROMIDE 0.02 % IN SOLN
RESPIRATORY_TRACT | Status: AC
  Filled 2015-03-06: qty 2.5

## 2015-03-06 NOTE — Progress Notes (Signed)
Hematology and Oncology Follow Up Visit  Wendy Edwards [id number] [birth year] 49 y.o. 03/06/2015   Principle Diagnosis:  1. Aplastic anemia - clinical remission. 2. Chronic anxiety. 3. Hypertension.  Current Therapy:    Observation     Interim History:  Ms.  Edwards is for followup. She's not feeling too well. She has a cough. She has tightness in her chest. I think she is having a asthma attack. She's had this now for several days. She's not really getting better. As such, we will try to treat this in the office.  She's had no problems with fever. She's had no bleeding. She's had no rashes. She's had no change in bowel or bladder habits.  She is trying to quit smoking. She's down to a couple cigarettes a day.  There's been no diarrhea.   Overall, her performance status is ECOG 1. Medications:  Current outpatient prescriptions:  .  ALPRAZolam (XANAX) 1 MG tablet, TAKE 1 TABLET BY MOUTH 3 TIMES A DAY AS NEEDED FOR ANXIETY, Disp: 90 tablet, Rfl: 1 .  Ibuprofen 200 MG CAPS, Take by mouth as needed., Disp: , Rfl:  .  PROAIR HFA 108 (90 BASE) MCG/ACT inhaler, TAKE 2 PUFFS EVERY 6 HOURS AS NEEDED, Disp: 8.5 each, Rfl: 4 .  valsartan (DIOVAN) 80 MG tablet, Take 80 mg by mouth., Disp: , Rfl:  .  cefdinir (OMNICEF) 300 MG capsule, Take 1 capsule (300 mg total) by mouth 2 (two) times daily., Disp: 14 capsule, Rfl: 0 .  chlorpheniramine-HYDROcodone (TUSSIONEX PENNKINETIC ER) 10-8 MG/5ML LQCR, Take 5 mLs by mouth every 12 (twelve) hours as needed for cough., Disp: 100 mL, Rfl: 0 .  methylPREDNISolone (MEDROL DOSEPAK) 4 MG tablet, follow package directions, Disp: 21 tablet, Rfl: 0  Current facility-administered medications:  .  albuterol (PROVENTIL) (5 MG/ML) 0.5% nebulizer solution 2.5 mg, 2.5 mg, Nebulization, Once, [name], MD  Allergies: No Known Allergies  Past Medical History, Surgical history, Social history, and Family History were reviewed and updated.  Review of  Systems: As above  Physical Exam:  height is 5\' 3"  (1.6 m) and weight is 158 lb (71.668 kg). Her oral temperature is 98.4 F (36.9 C). Her blood pressure is 135/81 and her pulse is 90. Her respiration is 14.   Well-developed well-nourished white female. Head and neck exam shows no ocular or oral lesion. There is no palpable cervical or supraclavicular lymph nodes. Lungs are somewhat tight. She has wheezes bilaterally. She has relatively decent air movement. Cardiac exam regular rate and rhythm with no murmurs, rubs or bruits.. Abdomen is soft. She has good bowel sounds. There is no fluid wave. There is no palpable liver or spleen tip. Extremities shows no clubbing, cyanosis or edema. She has decreased range of motion of the right shoulder. There is some point tenderness in the posterior right shoulder. Skin exam shows no rashes, ecchymosis or petechia. Neurological exam is nonfocal.  Lab Results  Component Value Date   WBC 6.5 03/06/2015   HGB 13.4 03/06/2015   HCT 37.2 03/06/2015   MCV 98 03/06/2015   PLT 122 Platelet count consistent in citrate* 03/06/2015     Chemistry      Component Value Date/Time   NA 140 01/22/2015 1355   NA 144 12/24/2010 1126   K 4.6 01/22/2015 1355   K 4.3 12/24/2010 1126   CL 102 01/22/2015 1355   CL 103 12/24/2010 1126   CO2 24 01/22/2015 1355   CO2 27 12/24/2010 1126  BUN 7 01/22/2015 1355   BUN 5* 12/24/2010 1126   CREATININE 0.62 01/22/2015 1355   CREATININE 0.4* 12/24/2010 1126      Component Value Date/Time   CALCIUM 9.3 01/22/2015 1355   CALCIUM 9.2 12/24/2010 1126   ALKPHOS 70 07/31/2013 1232   AST 22 07/31/2013 1232   ALT 24 07/31/2013 1232   BILITOT 0.3 07/31/2013 1232         Impression and Plan: Wendy Edwards is 49 year old white female. She has a remote history of aplastic anemia. She was treated with immunosuppression therapy with anti-thrombocyte globulin. This probably was 15 years ago.  I am happy now. Her blood counts have  stabilized and improved.  She has an asthma attack. Her lungs sound quite congested. I'll go ahead and give her a nebulizer in the office. I will send in a Medrol Dosepak and an antibiotic prescription for Omnicef. I will also give her a prescription for Tussionex elixir.  Hopefully, this episode will get her to stop smoking. She still smoking a couple cigarettes a day.   I spent about 35-40 minutes with her today. [name], MD 3/18/20163:43 PM

## 2015-04-01 ENCOUNTER — Other Ambulatory Visit: Payer: Self-pay | Admitting: Hematology & Oncology

## 2015-05-24 ENCOUNTER — Other Ambulatory Visit: Payer: Self-pay | Admitting: Hematology & Oncology

## 2015-05-25 ENCOUNTER — Other Ambulatory Visit: Payer: Self-pay | Admitting: Hematology & Oncology

## 2015-06-02 ENCOUNTER — Telehealth: Payer: Self-pay | Admitting: Hematology & Oncology

## 2015-06-02 NOTE — Telephone Encounter (Signed)
Pt called to cancel 6/16 appt. Will cb to reschedule.

## 2015-06-04 ENCOUNTER — Other Ambulatory Visit: Payer: Medicare Other

## 2015-06-04 ENCOUNTER — Ambulatory Visit: Payer: Medicare Other | Admitting: Hematology & Oncology

## 2015-07-20 ENCOUNTER — Other Ambulatory Visit: Payer: Self-pay | Admitting: Hematology & Oncology

## 2015-07-25 ENCOUNTER — Emergency Department (HOSPITAL_COMMUNITY)
Admission: EM | Admit: 2015-07-25 | Discharge: 2015-07-25 | Disposition: A | Discharge status: Home / Self Care | Payer: Medicare Other | Attending: Emergency Medicine | Admitting: Emergency Medicine

## 2015-07-25 ENCOUNTER — Encounter (HOSPITAL_COMMUNITY): Payer: Self-pay | Admitting: Emergency Medicine

## 2015-07-25 DIAGNOSIS — J45909 Unspecified asthma, uncomplicated: Secondary | ICD-10-CM | POA: Diagnosis not present

## 2015-07-25 DIAGNOSIS — I10 Essential (primary) hypertension: Secondary | ICD-10-CM | POA: Diagnosis not present

## 2015-07-25 DIAGNOSIS — Z862 Personal history of diseases of the blood and blood-forming organs and certain disorders involving the immune mechanism: Secondary | ICD-10-CM | POA: Insufficient documentation

## 2015-07-25 DIAGNOSIS — Z720 Tobacco use: Secondary | ICD-10-CM | POA: Diagnosis not present

## 2015-07-25 DIAGNOSIS — Z8719 Personal history of other diseases of the digestive system: Secondary | ICD-10-CM | POA: Insufficient documentation

## 2015-07-25 DIAGNOSIS — F419 Anxiety disorder, unspecified: Secondary | ICD-10-CM | POA: Insufficient documentation

## 2015-07-25 DIAGNOSIS — M79601 Pain in right arm: Secondary | ICD-10-CM | POA: Insufficient documentation

## 2015-07-25 LAB — BASIC METABOLIC PANEL
Anion gap: 15 (ref 5–15)
BUN: 5 mg/dL — ABNORMAL LOW (ref 6–20)
CO2: 20 mmol/L — ABNORMAL LOW (ref 22–32)
Calcium: 8.9 mg/dL (ref 8.9–10.3)
Chloride: 106 mmol/L (ref 101–111)
Creatinine, Ser: 0.55 mg/dL (ref 0.44–1.00)
GFR calc Af Amer: >60 mL/min (ref >60)
GFR calc non Af Amer: >60 mL/min (ref >60)
Glucose, Bld: 92 mg/dL (ref 65–99)
Potassium: 4.1 mmol/L (ref 3.5–5.1)
Sodium: 141 mmol/L (ref 135–145)

## 2015-07-25 LAB — CBC WITH DIFFERENTIAL/PLATELET
Basophils Absolute: 0.1 10*3/uL (ref 0.0–0.1)
Basophils Relative: 1 % (ref 0–1)
Eosinophils Absolute: 0.2 10*3/uL (ref 0.0–0.7)
Eosinophils Relative: 2 % (ref 0–5)
HCT: 43.8 % (ref 36.0–46.0)
Hemoglobin: 14.8 g/dL (ref 12.0–15.0)
Lymphocytes Relative: 40 % (ref 12–46)
Lymphs Abs: 2.9 10*3/uL (ref 0.7–4.0)
MCH: 33.6 pg (ref 26.0–34.0)
MCHC: 33.8 g/dL (ref 30.0–36.0)
MCV: 99.3 fL (ref 78.0–100.0)
Monocytes Absolute: 0.5 10*3/uL (ref 0.1–1.0)
Monocytes Relative: 7 % (ref 3–12)
Neutro Abs: 3.5 10*3/uL (ref 1.7–7.7)
Neutrophils Relative %: 50 % (ref 43–77)
Platelets: 166 10*3/uL (ref 150–400)
RBC: 4.41 MIL/uL (ref 3.87–5.11)
RDW: 12.3 % (ref 11.5–15.5)
WBC: 7.1 10*3/uL (ref 4.0–10.5)

## 2015-07-25 MED ORDER — IBUPROFEN 800 MG PO TABS: 800.0000 mg | ORAL_TABLET | Freq: Three times a day (TID) | ORAL | Status: DC | PRN

## 2015-07-25 MED ORDER — TRAMADOL HCL 50 MG PO TABS: 50.0000 mg | ORAL_TABLET | Freq: Four times a day (QID) | ORAL | Status: DC | PRN

## 2015-07-25 NOTE — Discharge Instructions (Signed)
Return here as needed. Follow up with your doctor. °

## 2015-07-25 NOTE — ED Provider Notes (Signed)
CSN: [id number]     Arrival date & time 07/25/15  2004 History   First MD Initiated Contact with Patient 07/25/15 2121     Chief Complaint  Patient presents with  . Arm Pain     (Consider location/radiation/quality/duration/timing/severity/associated sxs/prior Treatment) HPI   Patient is a 49 year old female with past medical history of asthma and aplastic anemia who presents to the ED with a "knot" in her right arm.  Patient states that she awoke this morning with pain in her posterior arm and a raised area just above her elbow.  The area is not warm to the touch. Patient states this has not happened to her before. She denies any known trauma or injury to the area.  She denies sick contacts, recent infection or antibiotic use.  Patient states the raised area itself is mildly tender.  She denies fever, chills, weakness, fatigue, abdominal pain, dysuria.   Past Medical History  Diagnosis Date  . Hypertension   . GERD (gastroesophageal reflux disease)   . Asthma   . Anxiety   . Aplastic anemia    Past Surgical History  Procedure Laterality Date  . Abdominal hysterectomy    . Knee surgery      Right x 2  . Tunneled venous catheter placement     Family History  Problem Relation Age of Onset  . Coronary artery disease Father 50    CABG  . Cancer Father   . Hypertension Mother   . Cancer Mother    History  Substance Use Topics  . Smoking status: Current Every Day Smoker -- 0.50 packs/day for 27 years    Types: Cigarettes    Start date: 03/01/1988  . Smokeless tobacco: Never Used     Comment:  03-06-15  STILL SMOKING  does not want to quit  . Alcohol Use: 0.0 oz/week    0 Standard drinks or equivalent per week   OB History    No data available     Review of Systems  All other systems negative except as documented in the HPI. All pertinent positives and negatives as reviewed in the HPI.   Allergies  Review of patient's allergies indicates no known allergies.  Home  Medications   Prior to Admission medications   Medication Sig Start Date End Date Taking? Authorizing Provider  albuterol (PROVENTIL HFA;VENTOLIN HFA) 108 (90 BASE) MCG/ACT inhaler Inhale 1 puff into the lungs every 6 (six) hours as needed for wheezing or shortness of breath.   Yes Historical Provider, MD  ALPRAZolam Duanne Moron) 1 MG tablet Take 1 tablet (1 mg total) by mouth 3 (three) times daily as needed. Patient taking differently: Take 1 mg by mouth 3 (three) times daily as needed for anxiety.  07/20/15  Yes [name], MD  cefdinir (OMNICEF) 300 MG capsule Take 1 capsule (300 mg total) by mouth 2 (two) times daily. Patient not taking: Reported on 07/25/2015 03/06/15   [name], MD  chlorpheniramine-HYDROcodone [facility]) 10-8 MG/5ML [facility] Take 5 mLs by mouth every 12 (twelve) hours as needed for cough. Patient not taking: Reported on 07/25/2015 03/06/15   [name], MD  Ibuprofen 200 MG CAPS Take by mouth as needed.    Historical Provider, MD  methylPREDNISolone (MEDROL DOSEPAK) 4 MG tablet follow package directions Patient not taking: Reported on 07/25/2015 03/06/15   [name], MD  PROAIR HFA 108 (90 BASE) MCG/ACT inhaler TAKE 2 PUFFS EVERY 6 HOURS AS NEEDED Patient  not taking: Reported on 07/25/2015 04/01/15   [name], MD  valsartan (DIOVAN) 80 MG tablet Take 80 mg by mouth. 10/07/14 10/07/15  Historical Provider, MD   BP 153/96 mmHg  Pulse 128  Temp(Src) 98.3 F (36.8 C) (Oral)  Resp 20  Ht 5\' 3"  (1.6 m)  Wt 152 lb (68.947 kg)  BMI 26.93 kg/m2  SpO2 93% Physical Exam  Constitutional: She is oriented to person, place, and time. She appears well-developed and well-nourished. No distress.  HENT:  Head: Normocephalic and atraumatic.  Eyes: Pupils are equal, round, and reactive to light.  Neck: Normal range of motion. Neck supple.  Cardiovascular: Normal rate, regular rhythm, normal heart sounds and intact distal pulses.  Exam reveals no gallop and  no friction rub.   No murmur heard. Pulmonary/Chest: Effort normal and breath sounds normal. No respiratory distress. She has no wheezes.  Musculoskeletal: She exhibits no edema.  Swelling just superior to the antecubital fossa.  Flesh-colored, and not warm to the touch. Mildly tender with deep palpation.   Neurological: She is alert and oriented to person, place, and time.  Skin: Skin is warm and dry. She is not diaphoretic.  Psychiatric: She has a normal mood and affect. Her behavior is normal.  Nursing note and vitals reviewed.   ED Course  Procedures (including critical care time) Labs Review Labs Reviewed  BASIC METABOLIC PANEL - Abnormal; Notable for the following:    CO2 20 (*)    BUN 5 (*)    All other components within normal limits  CBC WITH DIFFERENTIAL/PLATELET    Patient is advised to use ice and heat on the area that is swollen.  Told to return here as needed.  Advised her to follow-up with her primary care Dr. for recheck of the area.  The area has some mild swelling just above the antecubital fossa medially.  This swelling is pretty nonspecific and does not appear to be vascular in nature.  She has no other swelling noted to the upper extremity   Dalia Heading, PA-C 07/28/15 [place], DO 07/29/15 1644

## 2015-07-25 NOTE — ED Notes (Signed)
Pt arrived to the Ed with a complaint of right arm pain.  Pt states pain began three hours from this writing.  Pt states that the pain is radiating up her arm. Pt has a knot near her AC.

## 2015-08-05 ENCOUNTER — Encounter: Payer: Self-pay | Admitting: Family

## 2015-08-05 ENCOUNTER — Ambulatory Visit (HOSPITAL_BASED_OUTPATIENT_CLINIC_OR_DEPARTMENT_OTHER): Payer: Medicare Other | Admitting: Family

## 2015-08-05 ENCOUNTER — Other Ambulatory Visit (HOSPITAL_BASED_OUTPATIENT_CLINIC_OR_DEPARTMENT_OTHER): Payer: Medicare Other

## 2015-08-05 VITALS — BP 154/83 | HR 93 | Temp 98.0°F | Resp 16 | Ht 62.0 in | Wt 150.0 lb

## 2015-08-05 DIAGNOSIS — J4520 Mild intermittent asthma, uncomplicated: Secondary | ICD-10-CM

## 2015-08-05 DIAGNOSIS — J9801 Acute bronchospasm: Secondary | ICD-10-CM

## 2015-08-05 DIAGNOSIS — J45909 Unspecified asthma, uncomplicated: Secondary | ICD-10-CM | POA: Diagnosis not present

## 2015-08-05 DIAGNOSIS — Z862 Personal history of diseases of the blood and blood-forming organs and certain disorders involving the immune mechanism: Secondary | ICD-10-CM | POA: Diagnosis not present

## 2015-08-05 LAB — CBC WITH DIFFERENTIAL (CANCER CENTER ONLY)
BASO#: 0 10*3/uL (ref 0.0–0.2)
BASO%: 0.4 % (ref 0.0–2.0)
EOS ABS: 0.2 10*3/uL (ref 0.0–0.5)
EOS%: 3.4 % (ref 0.0–7.0)
HEMATOCRIT: 43.6 % (ref 34.8–46.6)
HGB: 15.1 g/dL (ref 11.6–15.9)
LYMPH#: 1.5 10*3/uL (ref 0.9–3.3)
LYMPH%: 34.3 % (ref 14.0–48.0)
MCH: 35.1 pg — ABNORMAL HIGH (ref 26.0–34.0)
MCHC: 34.6 g/dL (ref 32.0–36.0)
MCV: 101 fL (ref 81–101)
MONO#: 0.7 10*3/uL (ref 0.1–0.9)
MONO%: 15.2 % — ABNORMAL HIGH (ref 0.0–13.0)
NEUT#: 2.1 10*3/uL (ref 1.5–6.5)
NEUT%: 46.7 % (ref 39.6–80.0)
Platelets: 111 10*3/uL — ABNORMAL LOW (ref 145–400)
RBC: 4.3 10*6/uL (ref 3.70–5.32)
RDW: 12.4 % (ref 11.1–15.7)
WBC: 4.5 10*3/uL (ref 3.9–10.0)

## 2015-08-05 LAB — COMPREHENSIVE METABOLIC PANEL
ALT: 19 U/L (ref 6–29)
AST: 22 U/L (ref 10–35)
Albumin: 4.4 g/dL (ref 3.6–5.1)
Alkaline Phosphatase: 55 U/L (ref 33–115)
BILIRUBIN TOTAL: 0.4 mg/dL (ref 0.2–1.2)
BUN: 9 mg/dL (ref 7–25)
CHLORIDE: 99 mmol/L (ref 98–110)
CO2: 23 mmol/L (ref 20–31)
CREATININE: 0.68 mg/dL (ref 0.50–1.10)
Calcium: 9.9 mg/dL (ref 8.6–10.2)
Glucose, Bld: 90 mg/dL (ref 65–99)
Potassium: 4.1 mmol/L (ref 3.5–5.3)
SODIUM: 136 mmol/L (ref 135–146)
TOTAL PROTEIN: 7.5 g/dL (ref 6.1–8.1)

## 2015-08-05 LAB — CHCC SATELLITE - SMEAR

## 2015-08-05 MED ORDER — ALBUTEROL SULFATE 1.25 MG/3ML IN NEBU: 1.0000 | INHALATION_SOLUTION | Freq: Three times a day (TID) | RESPIRATORY_TRACT | Status: DC | PRN

## 2015-08-05 NOTE — Progress Notes (Signed)
Hematology and Oncology Follow Up Visit  Wendy Edwards [id number] [birth year] 49 y.o. 08/05/2015   Principle Diagnosis:  Aplastic anemia - clinical remission.  Current Therapy:   Observation    Interim History:  Wendy Edwards is here today for a follow-up. She is doing well and has no complaints at this time. She did go to the ED over the weekend with c/o mild pain and bump on her right arm pain. Her work up was negative and the pain and bump have resolved.  She denies fatigue. Her CBC today looks good. Her platelet count continues to fluctuate and is 111 today. She has had no episodes of bruising, petechiae or bleeding.  No fever, chills, n/v, cough, rash, dizziness, SOB, chest pain, palpitations, abdominal pain, changes in bowel or bladder habits. She has not noticed any blood in her urine or stool.  She has had no numbness or tingling in her extremities.  She has a healthy appetite and is staying well hydrated.  She has cut back on her smoking and would like to quit eventually. This does aggravate her asthma. She would like her albuterol inhaler refilled.  She also needs a new PCP. She has not had a physical in several years.   Medications:    Medication List       This list is accurate as of: 08/05/15  2:16 PM.  Always use your most recent med list.               ALPRAZolam 1 MG tablet  Commonly known as:  XANAX  Take 1 tablet (1 mg total) by mouth 3 (three) times daily as needed.     cefdinir 300 MG capsule  Commonly known as:  OMNICEF  Take 1 capsule (300 mg total) by mouth 2 (two) times daily.     chlorpheniramine-HYDROcodone 10-8 MG/5ML Lqcr  Commonly known as:  TUSSIONEX PENNKINETIC ER  Take 5 mLs by mouth every 12 (twelve) hours as needed for cough.     ibuprofen 800 MG tablet  Commonly known as:  ADVIL,MOTRIN  Take 1 tablet (800 mg total) by mouth every 8 (eight) hours as needed.     methylPREDNISolone 4 MG tablet  Commonly known as:  MEDROL DOSEPAK  follow  package directions     albuterol 108 (90 BASE) MCG/ACT inhaler  Commonly known as:  PROVENTIL HFA;VENTOLIN HFA  Inhale 1 puff into the lungs every 6 (six) hours as needed for wheezing or shortness of breath.     PROAIR HFA 108 (90 BASE) MCG/ACT inhaler  Generic drug:  albuterol  TAKE 2 PUFFS EVERY 6 HOURS AS NEEDED     traMADol 50 MG tablet  Commonly known as:  ULTRAM  Take 1 tablet (50 mg total) by mouth every 6 (six) hours as needed.     valsartan 80 MG tablet  Commonly known as:  DIOVAN  Take 80 mg by mouth.        Allergies: No Known Allergies  Past Medical History, Surgical history, Social history, and Family History were reviewed and updated.  Review of Systems: All other 10 point review of systems is negative.   Physical Exam:  vitals were not taken for this visit.  Wt Readings from Last 3 Encounters:  07/25/15 152 lb (68.947 kg)  03/06/15 158 lb (71.668 kg)  01/22/15 161 lb (73.029 kg)    Ocular: Sclerae unicteric, pupils equal, round and reactive to light Ear-nose-throat: Oropharynx clear, dentition fair Lymphatic: No cervical or supraclavicular adenopathy  Lungs no rales or rhonchi, good excursion bilaterally Heart regular rate and rhythm, no murmur appreciated Abd soft, nontender, positive bowel sounds MSK no focal spinal tenderness, no joint edema Neuro: non-focal, well-oriented, appropriate affect Breasts: Deferred  Lab Results  Component Value Date   WBC 7.1 07/25/2015   HGB 14.8 07/25/2015   HCT 43.8 07/25/2015   MCV 99.3 07/25/2015   PLT 166 07/25/2015   No results found for: FERRITIN, IRON, TIBC, UIBC, IRONPCTSAT Lab Results  Component Value Date   RETICCTPCT 1.9 03/06/2015   RBC 4.41 07/25/2015   RETICCTABS 74.1 03/06/2015   No results found for: KPAFRELGTCHN, LAMBDASER, KAPLAMBRATIO No results found for: IGGSERUM, IGA, IGMSERUM No results found for: Odetta Pink, SPEI   Chemistry        Component Value Date/Time   NA 141 07/25/2015 2154   NA 144 12/24/2010 1126   K 4.1 07/25/2015 2154   K 4.3 12/24/2010 1126   CL 106 07/25/2015 2154   CL 103 12/24/2010 1126   CO2 20* 07/25/2015 2154   CO2 27 12/24/2010 1126   BUN 5* 07/25/2015 2154   BUN 5* 12/24/2010 1126   CREATININE 0.55 07/25/2015 2154   CREATININE 0.4* 12/24/2010 1126      Component Value Date/Time   CALCIUM 8.9 07/25/2015 2154   CALCIUM 9.2 12/24/2010 1126   ALKPHOS 73 03/06/2015 1442   AST 33 03/06/2015 1442   ALT 54* 03/06/2015 1442   BILITOT 0.3 03/06/2015 1442     Impression and Plan: Wendy Edwards is 49 yo white female with a remote history of aplastic anemia. She was treated with immunosuppression therapy with anti-thrombocyte globulin over 15 years ago. She is doing well now and is asymptomatic at this time.  She does have asthma which is exacerbated with her smoking. We will refill her nebulizer prescription. She is also cutting back on her smoking and trying to quit.  I also gave her the information needed to make a new patient appointment with Debbrah Alar downstairs. She is very excited to have her PCP in the same building.  Her CBC today looked good.  We will plan to see her back in 6 months for labs and follow-up.  She knows to contact us with any questions or concerns. We can certainly see her sooner if need be.   Eliezer Bottom, NP 8/17/20162:16 PM

## 2015-08-10 ENCOUNTER — Other Ambulatory Visit: Payer: Self-pay | Admitting: Hematology & Oncology

## 2015-09-11 ENCOUNTER — Other Ambulatory Visit: Payer: Self-pay | Admitting: Hematology & Oncology

## 2015-09-14 ENCOUNTER — Other Ambulatory Visit: Payer: Self-pay | Admitting: Nurse Practitioner

## 2015-09-14 ENCOUNTER — Other Ambulatory Visit: Payer: Self-pay | Admitting: Hematology & Oncology

## 2015-09-29 ENCOUNTER — Other Ambulatory Visit: Payer: Self-pay | Admitting: Family

## 2015-10-24 ENCOUNTER — Encounter (HOSPITAL_COMMUNITY): Payer: Self-pay | Admitting: *Deleted

## 2015-10-24 ENCOUNTER — Emergency Department (HOSPITAL_COMMUNITY)
Admission: EM | Admit: 2015-10-24 | Discharge: 2015-10-25 | Disposition: A | Discharge status: Home / Self Care | Payer: Medicare Other | Attending: Emergency Medicine | Admitting: Emergency Medicine

## 2015-10-24 DIAGNOSIS — Z8719 Personal history of other diseases of the digestive system: Secondary | ICD-10-CM | POA: Diagnosis not present

## 2015-10-24 DIAGNOSIS — F419 Anxiety disorder, unspecified: Secondary | ICD-10-CM | POA: Insufficient documentation

## 2015-10-24 DIAGNOSIS — M79605 Pain in left leg: Secondary | ICD-10-CM | POA: Insufficient documentation

## 2015-10-24 DIAGNOSIS — I10 Essential (primary) hypertension: Secondary | ICD-10-CM | POA: Insufficient documentation

## 2015-10-24 DIAGNOSIS — Z720 Tobacco use: Secondary | ICD-10-CM | POA: Insufficient documentation

## 2015-10-24 DIAGNOSIS — M79604 Pain in right leg: Secondary | ICD-10-CM | POA: Diagnosis not present

## 2015-10-24 DIAGNOSIS — J45909 Unspecified asthma, uncomplicated: Secondary | ICD-10-CM | POA: Diagnosis not present

## 2015-10-24 DIAGNOSIS — Z862 Personal history of diseases of the blood and blood-forming organs and certain disorders involving the immune mechanism: Secondary | ICD-10-CM | POA: Diagnosis not present

## 2015-10-24 NOTE — ED Provider Notes (Signed)
CSN: [id number]     Arrival date & time 10/24/15  2310 History  By signing my name below, I, Hansel Feinstein, attest that this documentation has been prepared under the direction and in the presence of Veryl Speak, MD. Electronically Signed: Hansel Feinstein, ED Scribe. 10/25/2015. 12:01 AM.    Chief Complaint  Patient presents with  . Leg Pain   Patient is a 49 y.o. female presenting with leg pain. The history is provided by the patient. No language interpreter was used.  Leg Pain Location:  Leg Leg location:  L upper leg and R upper leg Pain details:    Quality:  Cramping and aching   Radiates to:  Does not radiate   Severity:  Moderate   Onset quality:  Gradual   Duration:  3 days   Timing:  Constant   Progression:  Unchanged Chronicity:  New Prior injury to area:  No Worsened by:  Bearing weight and activity Associated symptoms: no muscle weakness and no numbness     HPI Comments: Wendy Edwards is a 49 y.o. female who presents to the Emergency Department complaining of moderate bilateral thigh pain, described as cramping and aching, onset 3 days ago. Pt states that the pain feels muscular. No recent falls, trauma or injury. Pt states pain with palpation and ambulation. No new activities, exercises. She denies focal weakness, numbness, dysuria, hematuria, frequency, urgency.  Past Medical History  Diagnosis Date  . Hypertension   . GERD (gastroesophageal reflux disease)   . Asthma   . Anxiety   . Aplastic anemia ([place])    Past Surgical History  Procedure Laterality Date  . Abdominal hysterectomy    . Knee surgery      Right x 2  . Tunneled venous catheter placement     Family History  Problem Relation Age of Onset  . Coronary artery disease Father 1    CABG  . Cancer Father   . Hypertension Mother   . Cancer Mother    Social History  Substance Use Topics  . Smoking status: Current Every Day Smoker -- 0.50 packs/day for 27 years    Types: Cigarettes    Start date:  03/01/1988  . Smokeless tobacco: Never Used     Comment:  03-06-15  STILL SMOKING  does not want to quit  . Alcohol Use: 0.0 oz/week    0 Standard drinks or equivalent per week   OB History    No data available     Review of Systems  Genitourinary: Negative for dysuria, urgency, frequency and hematuria.  Musculoskeletal: Positive for myalgias.  Neurological: Negative for weakness and numbness.  All other systems reviewed and are negative.  Allergies  Review of patient's allergies indicates no known allergies.  Home Medications   Prior to Admission medications   Medication Sig Start Date End Date Taking? Authorizing Provider  ALPRAZolam (XANAX) 1 MG tablet TAKE 1 TABLET BY MOUTH 3 TIMES A DAY AS NEEDED FOR ANXIETY 09/14/15  Yes [name], MD  ibuprofen (ADVIL,MOTRIN) 200 MG tablet Take 400 mg by mouth every 6 (six) hours as needed for headache.   Yes Historical Provider, MD  PROAIR HFA 108 (90 BASE) MCG/ACT inhaler TAKE 2 PUFFS EVERY 6 HOURS AS NEEDED 08/10/15  Yes [name], MD  albuterol (ACCUNEB) 1.25 MG/3ML nebulizer solution TAKE 3 ML(1 VIAL) BY NEBULIZATION 3 TIMES DAILY AS NEEDED FOR WHEEZING. Patient not taking: Reported on 10/24/2015 09/30/15   Eliezer Bottom, NP  cefdinir (OMNICEF) 300 MG capsule Take 1 capsule (300 mg total) by mouth 2 (two) times daily. Patient not taking: Reported on 07/25/2015 03/06/15   [name], MD  chlorpheniramine-HYDROcodone [facility]) 10-8 MG/5ML [facility] Take 5 mLs by mouth every 12 (twelve) hours as needed for cough. Patient not taking: Reported on 07/25/2015 03/06/15   [name], MD  ibuprofen (ADVIL,MOTRIN) 800 MG tablet Take 1 tablet (800 mg total) by mouth every 8 (eight) hours as needed. Patient not taking: Reported on 10/24/2015 07/25/15   Dalia Heading, PA-C  methylPREDNISolone (MEDROL DOSEPAK) 4 MG tablet follow package directions Patient not taking: Reported on 07/25/2015 03/06/15   [name], MD   traMADol (ULTRAM) 50 MG tablet Take 1 tablet (50 mg total) by mouth every 6 (six) hours as needed. Patient not taking: Reported on 10/24/2015 07/25/15   Dalia Heading, PA-C  valsartan (DIOVAN) 80 MG tablet Take 80 mg by mouth. 10/07/14 10/07/15  Historical Provider, MD   BP 166/136 mmHg  Pulse 127  Temp(Src) 97.9 F (36.6 C) (Oral)  Resp 26  SpO2 96% Physical Exam  Constitutional: She is oriented to person, place, and time. She appears well-developed and well-nourished. No distress.  HENT:  Head: Normocephalic and atraumatic.  Eyes: EOM are normal.  Neck: Normal range of motion.  Cardiovascular: Normal rate, regular rhythm and normal heart sounds.   Pulmonary/Chest: Effort normal and breath sounds normal.  Abdominal: Soft. She exhibits no distension. There is no tenderness.  Musculoskeletal: Normal range of motion.  Bilateral lower extremities appear grossly normal. There is TTP of both thighs. Distal pulses are easily palpable. Strength 5/5 throughout BLE.   Neurological: She is alert and oriented to person, place, and time.  Skin: Skin is warm and dry.  Psychiatric: She has a normal mood and affect. Judgment normal.  Nursing note and vitals reviewed.  ED Course  Procedures (including critical care time) DIAGNOSTIC STUDIES: Oxygen Saturation is 96% on RA, normal by my interpretation.    COORDINATION OF CARE: 11:59 PM Discussed treatment plan with pt at bedside and pt agreed to plan.   Labs Review Labs Reviewed  BASIC METABOLIC PANEL  CBC WITH DIFFERENTIAL/PLATELET  CK  SEDIMENTATION RATE   I have personally reviewed and evaluated these lab results as part of my medical decision-making.  MDM   Final diagnoses:  None   Labs not consist with rhabdo or inflammatory process.  No electrolyte abnormality.  I personally performed the services described in this documentation, which was scribed in my presence. The recorded information has been reviewed and is accurate.        Veryl Speak, MD 10/25/15 [phone]

## 2015-10-24 NOTE — ED Notes (Signed)
The pt is c/o bi-lateral leg pain for 3 days.  No known injury.  The pain is from her knees upward

## 2015-10-25 DIAGNOSIS — M79604 Pain in right leg: Secondary | ICD-10-CM | POA: Diagnosis not present

## 2015-10-25 LAB — CBC WITH DIFFERENTIAL/PLATELET
BASOS ABS: 0 10*3/uL (ref 0.0–0.1)
Basophils Relative: 1 %
EOS PCT: 2 %
Eosinophils Absolute: 0.1 10*3/uL (ref 0.0–0.7)
HEMATOCRIT: 44.7 % (ref 36.0–46.0)
HEMOGLOBIN: 15.3 g/dL — AB (ref 12.0–15.0)
LYMPHS ABS: 2.5 10*3/uL (ref 0.7–4.0)
Lymphocytes Relative: 38 %
MCH: 34.8 pg — AB (ref 26.0–34.0)
MCHC: 34.2 g/dL (ref 30.0–36.0)
MCV: 101.6 fL — AB (ref 78.0–100.0)
MONO ABS: 0.7 10*3/uL (ref 0.1–1.0)
MONOS PCT: 10 %
NEUTROS PCT: 49 %
Neutro Abs: 3.2 10*3/uL (ref 1.7–7.7)
PLATELETS: 101 10*3/uL — AB (ref 150–400)
RBC: 4.4 MIL/uL (ref 3.87–5.11)
RDW: 13.6 % (ref 11.5–15.5)
WBC: 6.6 10*3/uL (ref 4.0–10.5)

## 2015-10-25 LAB — BASIC METABOLIC PANEL
Anion gap: 12 (ref 5–15)
CALCIUM: 9.3 mg/dL (ref 8.9–10.3)
CHLORIDE: 101 mmol/L (ref 101–111)
CO2: 24 mmol/L (ref 22–32)
CREATININE: 0.58 mg/dL (ref 0.44–1.00)
GFR calc non Af Amer: >60 mL/min (ref >60)
Glucose, Bld: 91 mg/dL (ref 65–99)
Potassium: 4.1 mmol/L (ref 3.5–5.1)
Sodium: 137 mmol/L (ref 135–145)

## 2015-10-25 LAB — CK: Total CK: 88 U/L (ref 38–234)

## 2015-10-25 LAB — SEDIMENTATION RATE: SED RATE: 14 mm/h (ref 0–22)

## 2015-10-25 MED ORDER — TRAMADOL HCL 50 MG PO TABS: 50.0000 mg | ORAL_TABLET | Freq: Four times a day (QID) | ORAL | Status: DC | PRN

## 2015-10-25 NOTE — ED Notes (Signed)
See downtime notes.

## 2015-10-25 NOTE — Discharge Instructions (Signed)
Ibuprofen 600 mg every six hours as needed for pain.  Tramadol as needed for pain not relieved with Ibuprofen.  Followup with your doctor if not improving in the next week.   Musculoskeletal Pain Musculoskeletal pain is muscle and boney aches and pains. These pains can occur in any part of the body. Your caregiver may treat you without knowing the cause of the pain. They may treat you if blood or urine tests, X-rays, and other tests were normal.  CAUSES There is often not a definite cause or reason for these pains. These pains may be caused by a type of germ (virus). The discomfort may also come from overuse. Overuse includes working out too hard when your body is not fit. Boney aches also come from weather changes. Bone is sensitive to atmospheric pressure changes. HOME CARE INSTRUCTIONS   Ask when your test results will be ready. Make sure you get your test results.  Only take over-the-counter or prescription medicines for pain, discomfort, or fever as directed by your caregiver. If you were given medications for your condition, do not drive, operate machinery or power tools, or sign legal documents for 24 hours. Do not drink alcohol. Do not take sleeping pills or other medications that may interfere with treatment.  Continue all activities unless the activities cause more pain. When the pain lessens, slowly resume normal activities. Gradually increase the intensity and duration of the activities or exercise.  During periods of severe pain, bed rest may be helpful. Lay or sit in any position that is comfortable.  Putting ice on the injured area.  Put ice in a bag.  Place a towel between your skin and the bag.  Leave the ice on for 15 to 20 minutes, 3 to 4 times a day.  Follow up with your caregiver for continued problems and no reason can be found for the pain. If the pain becomes worse or does not go away, it may be necessary to repeat tests or do additional testing. Your caregiver may  need to look further for a possible cause. SEEK IMMEDIATE MEDICAL CARE IF:  You have pain that is getting worse and is not relieved by medications.  You develop chest pain that is associated with shortness or breath, sweating, feeling sick to your stomach (nauseous), or throw up (vomit).  Your pain becomes localized to the abdomen.  You develop any new symptoms that seem different or that concern you. MAKE SURE YOU:   Understand these instructions.  Will watch your condition.  Will get help right away if you are not doing well or get worse.   This information is not intended to replace advice given to you by your health care provider. Make sure you discuss any questions you have with your health care provider.   Document Released: 12/05/2005 Document Revised: 02/27/2012 Document Reviewed: 08/09/2013 Elsevier Interactive Patient Education [employer].

## 2015-11-08 ENCOUNTER — Other Ambulatory Visit: Payer: Self-pay | Admitting: Hematology & Oncology

## 2015-11-14 ENCOUNTER — Encounter (HOSPITAL_COMMUNITY): Payer: Self-pay | Admitting: Emergency Medicine

## 2015-11-14 ENCOUNTER — Emergency Department (HOSPITAL_COMMUNITY)
Admission: EM | Admit: 2015-11-14 | Discharge: 2015-11-15 | Disposition: A | Discharge status: Home / Self Care | Payer: Medicare Other | Attending: Emergency Medicine | Admitting: Emergency Medicine

## 2015-11-14 DIAGNOSIS — F17210 Nicotine dependence, cigarettes, uncomplicated: Secondary | ICD-10-CM | POA: Insufficient documentation

## 2015-11-14 DIAGNOSIS — F419 Anxiety disorder, unspecified: Secondary | ICD-10-CM | POA: Diagnosis not present

## 2015-11-14 DIAGNOSIS — Z8719 Personal history of other diseases of the digestive system: Secondary | ICD-10-CM | POA: Diagnosis not present

## 2015-11-14 DIAGNOSIS — J45909 Unspecified asthma, uncomplicated: Secondary | ICD-10-CM | POA: Diagnosis not present

## 2015-11-14 DIAGNOSIS — Z862 Personal history of diseases of the blood and blood-forming organs and certain disorders involving the immune mechanism: Secondary | ICD-10-CM | POA: Diagnosis not present

## 2015-11-14 DIAGNOSIS — R1032 Left lower quadrant pain: Secondary | ICD-10-CM | POA: Insufficient documentation

## 2015-11-14 DIAGNOSIS — R109 Unspecified abdominal pain: Secondary | ICD-10-CM | POA: Diagnosis present

## 2015-11-14 DIAGNOSIS — Z79899 Other long term (current) drug therapy: Secondary | ICD-10-CM | POA: Diagnosis not present

## 2015-11-14 DIAGNOSIS — I10 Essential (primary) hypertension: Secondary | ICD-10-CM | POA: Diagnosis not present

## 2015-11-14 DIAGNOSIS — Z90710 Acquired absence of both cervix and uterus: Secondary | ICD-10-CM | POA: Diagnosis not present

## 2015-11-14 DIAGNOSIS — Z87442 Personal history of urinary calculi: Secondary | ICD-10-CM | POA: Insufficient documentation

## 2015-11-14 NOTE — ED Notes (Signed)
Pt c/o L flank/L side pain x 3 days. +nausea. Hx of kidney stones, last 3 yrs ago

## 2015-11-15 ENCOUNTER — Emergency Department (HOSPITAL_COMMUNITY): Payer: Medicare Other

## 2015-11-15 ENCOUNTER — Encounter (HOSPITAL_COMMUNITY): Payer: Self-pay | Admitting: Radiology

## 2015-11-15 DIAGNOSIS — R1032 Left lower quadrant pain: Secondary | ICD-10-CM | POA: Diagnosis not present

## 2015-11-15 LAB — URINALYSIS, ROUTINE W REFLEX MICROSCOPIC
Bilirubin Urine: NEGATIVE
GLUCOSE, UA: NEGATIVE mg/dL
HGB URINE DIPSTICK: NEGATIVE
Ketones, ur: NEGATIVE mg/dL
Nitrite: NEGATIVE
PH: 5 (ref 5.0–8.0)
Protein, ur: NEGATIVE mg/dL
SPECIFIC GRAVITY, URINE: 1.011 (ref 1.005–1.030)

## 2015-11-15 LAB — URINE MICROSCOPIC-ADD ON: RBC / HPF: NONE SEEN RBC/hpf (ref 0–5)

## 2015-11-15 LAB — I-STAT CHEM 8, ED
BUN: 4 mg/dL — AB (ref 6–20)
CREATININE: 0.9 mg/dL (ref 0.44–1.00)
Calcium, Ion: 1.06 mmol/L — ABNORMAL LOW (ref 1.12–1.23)
Chloride: 106 mmol/L (ref 101–111)
GLUCOSE: 79 mg/dL (ref 65–99)
HEMATOCRIT: 46 % (ref 36.0–46.0)
HEMOGLOBIN: 15.6 g/dL — AB (ref 12.0–15.0)
Potassium: 4.5 mmol/L (ref 3.5–5.1)
Sodium: 143 mmol/L (ref 135–145)
TCO2: 23 mmol/L (ref 0–100)

## 2015-11-15 MED ORDER — IBUPROFEN 200 MG PO TABS
600.0000 mg | ORAL_TABLET | Freq: Once | ORAL | Status: AC
  Administered 2015-11-15: 600 mg via ORAL
  Filled 2015-11-15: qty 3

## 2015-11-15 MED ORDER — HYDROCODONE-ACETAMINOPHEN 5-325 MG PO TABS
1.0000 | ORAL_TABLET | Freq: Once | ORAL | Status: AC
  Administered 2015-11-15: 1 via ORAL
  Filled 2015-11-15: qty 1

## 2015-11-15 MED ORDER — PROMETHAZINE HCL 25 MG PO TABS: 25.0000 mg | ORAL_TABLET | Freq: Four times a day (QID) | ORAL | Status: DC | PRN

## 2015-11-15 NOTE — Discharge Instructions (Signed)
All the ER results look normal. CT scan is not showing any OBSTRUCTIVE stone, and the urine is clear  -please see your primary doctor in 1 week.  Please return to the ER if your symptoms worsen; you have increased pain, fevers, chills, inability to keep any medications down, confusion. Otherwise see the outpatient doctor as requested.   Flank Pain Flank pain refers to pain that is located on the side of the body between the upper abdomen and the back. The pain may occur over a short period of time (acute) or may be long-term or reoccurring (chronic). It may be mild or severe. Flank pain can be caused by many things. CAUSES  Some of the more common causes of flank pain include:  Muscle strains.   Muscle spasms.   A disease of your spine (vertebral disk disease).   A lung infection (pneumonia).   Fluid around your lungs (pulmonary edema).   A kidney infection.   Kidney stones.   A very painful skin rash caused by the chickenpox virus (shingles).   Gallbladder disease.  [place] care will depend on the cause of your pain. In general,  Rest as directed by your caregiver.  Drink enough fluids to keep your urine clear or pale yellow.  Only take over-the-counter or prescription medicines as directed by your caregiver. Some medicines may help relieve the pain.  Tell your caregiver about any changes in your pain.  Follow up with your caregiver as directed. SEEK IMMEDIATE MEDICAL CARE IF:   Your pain is not controlled with medicine.   You have new or worsening symptoms.  Your pain increases.   You have abdominal pain.   You have shortness of breath.   You have persistent nausea or vomiting.   You have swelling in your abdomen.   You feel faint or pass out.   You have blood in your urine.  You have a fever or persistent symptoms for more than 2-3 days.  You have a fever and your symptoms suddenly get worse. MAKE SURE YOU:    Understand these instructions.  Will watch your condition.  Will get help right away if you are not doing well or get worse.   This information is not intended to replace advice given to you by your health care provider. Make sure you discuss any questions you have with your health care provider.   Document Released: 01/26/2006 Document Revised: 08/29/2012 Document Reviewed: 07/19/2012 Elsevier Interactive Patient Education 2016 Elsevier Inc.  Abdominal Pain, Adult Many things can cause abdominal pain. Usually, abdominal pain is not caused by a disease and will improve without treatment. It can often be observed and treated at home. Your health care provider will do a physical exam and possibly order blood tests and X-rays to help determine the seriousness of your pain. However, in many cases, more time must pass before a clear cause of the pain can be found. Before that point, your health care provider may not know if you need more testing or further treatment. HOME CARE INSTRUCTIONS Monitor your abdominal pain for any changes. The following actions may help to alleviate any discomfort you are experiencing:  Only take over-the-counter or prescription medicines as directed by your health care provider.  Do not take laxatives unless directed to do so by your health care provider.  Try a clear liquid diet (broth, tea, or water) as directed by your health care provider. Slowly move to a bland diet as tolerated. SEEK  MEDICAL CARE IF:  You have unexplained abdominal pain.  You have abdominal pain associated with nausea or diarrhea.  You have pain when you urinate or have a bowel movement.  You experience abdominal pain that wakes you in the night.  You have abdominal pain that is worsened or improved by eating food.  You have abdominal pain that is worsened with eating fatty foods.  You have a fever. SEEK IMMEDIATE MEDICAL CARE IF:  Your pain does not go away within 2  hours.  You keep throwing up (vomiting).  Your pain is felt only in portions of the abdomen, such as the right side or the left lower portion of the abdomen.  You pass bloody or black tarry stools. MAKE SURE YOU:  Understand these instructions.  Will watch your condition.  Will get help right away if you are not doing well or get worse.   This information is not intended to replace advice given to you by your health care provider. Make sure you discuss any questions you have with your health care provider.   Document Released: 09/14/2005 Document Revised: 08/26/2015 Document Reviewed: 08/14/2013 Elsevier Interactive Patient Education [employer].

## 2015-11-15 NOTE — ED Provider Notes (Signed)
CSN: [id number]     Arrival date & time 11/14/15  2221 History  By signing my name below, I, Irene Pap, attest that this documentation has been prepared under the direction and in the presence of Varney Biles, MD. Electronically Signed: Irene Pap, ED Scribe. 11/15/2015. 3:58 AM.  Chief Complaint  Patient presents with  . Flank Pain   The history is provided by the patient. No language interpreter was used.  HPI Comments: Wendy Edwards is a 49 y.o. Female with a hx of kidney stones, aplastic anemia in remission, who presents to the Emergency Department complaining of gradually worsening, intermittent, left flank pain and nausea onset 3 days ago. Pt reports her last instance of kidney stones was 5 years ago. She states that she was unable to pass them and had to be removed. She reports that her pain feels similar to her past hx of kidney stones. She reports associated headache. She reports hx of hysterectomy. She denies fever, chills, vomiting, hematuria, vaginal bleeding, or dysuria. Pt denies taking anything for her symptoms PTA.   Past Medical History  Diagnosis Date  . Hypertension   . GERD (gastroesophageal reflux disease)   . Asthma   . Anxiety   . Aplastic anemia ([place])   . Kidney stones    Past Surgical History  Procedure Laterality Date  . Abdominal hysterectomy    . Knee surgery      Right x 2  . Tunneled venous catheter placement     Family History  Problem Relation Age of Onset  . Coronary artery disease Father 29    CABG  . Cancer Father   . Hypertension Mother   . Cancer Mother    Social History  Substance Use Topics  . Smoking status: Current Every Day Smoker -- 0.50 packs/day for 27 years    Types: Cigarettes    Start date: 03/01/1988  . Smokeless tobacco: Never Used     Comment:  03-06-15  STILL SMOKING  does not want to quit  . Alcohol Use: 0.0 oz/week    0 Standard drinks or equivalent per week   OB History    No data available     Review  of Systems  10 Systems reviewed and all are negative for acute change except as noted in the HPI.  Allergies  Review of patient's allergies indicates no known allergies.  Home Medications   Prior to Admission medications   Medication Sig Start Date End Date Taking? Authorizing Provider  ALPRAZolam Duanne Moron) 1 MG tablet TAKE 1 TABLET BY MOUTH 3 TIMES A DAY AS NEEDED 11/09/15  Yes [name], MD  ibuprofen (ADVIL,MOTRIN) 200 MG tablet Take 400 mg by mouth every 6 (six) hours as needed for headache.   Yes Historical Provider, MD  PROAIR HFA 108 (90 BASE) MCG/ACT inhaler TAKE 2 PUFFS EVERY 6 HOURS AS NEEDED 08/10/15  Yes [name], MD  albuterol (ACCUNEB) 1.25 MG/3ML nebulizer solution TAKE 3 ML(1 VIAL) BY NEBULIZATION 3 TIMES DAILY AS NEEDED FOR WHEEZING. Patient not taking: Reported on 10/24/2015 09/30/15   Eliezer Bottom, NP  ibuprofen (ADVIL,MOTRIN) 800 MG tablet Take 1 tablet (800 mg total) by mouth every 8 (eight) hours as needed. Patient not taking: Reported on 10/24/2015 07/25/15   Dalia Heading, PA-C  promethazine (PHENERGAN) 25 MG tablet Take 1 tablet (25 mg total) by mouth every 6 (six) hours as needed for nausea. 11/15/15   Varney Biles, MD  traMADol (ULTRAM) 50 MG tablet  Take 1 tablet (50 mg total) by mouth every 6 (six) hours as needed. Patient not taking: Reported on 11/15/2015 10/25/15   Veryl Speak, MD   BP 132/92 mmHg  Pulse 101  Temp(Src) 97.9 F (36.6 C) (Oral)  Resp 14  SpO2 95% Physical Exam  Constitutional: She appears well-developed and well-nourished. No distress.  HENT:  Head: Normocephalic and atraumatic.  Mouth/Throat: Oropharynx is clear and moist. No oropharyngeal exudate.  Eyes: Conjunctivae and EOM are normal. Pupils are equal, round, and reactive to light. Right eye exhibits no discharge. Left eye exhibits no discharge. No scleral icterus.  Neck: Normal range of motion. Neck supple. No JVD present. No thyromegaly present.  Cardiovascular:  Normal rate, regular rhythm, normal heart sounds and intact distal pulses.  Exam reveals no gallop and no friction rub.   No murmur heard. Pulmonary/Chest: Effort normal and breath sounds normal. No respiratory distress. She has no wheezes. She has no rales.  Abdominal: Soft. Bowel sounds are normal. She exhibits no distension and no mass. There is tenderness.  Positive focal LLQ tenderness and positive left flank tenderness  Musculoskeletal: Normal range of motion. She exhibits no edema or tenderness.  Lymphadenopathy:    She has no cervical adenopathy.  Neurological: She is alert. Coordination normal.  Skin: Skin is warm and dry. No rash noted. No erythema.  Psychiatric: She has a normal mood and affect. Her behavior is normal.  Nursing note and vitals reviewed.   ED Course  Procedures (including critical care time) DIAGNOSTIC STUDIES: Oxygen Saturation is 96% on RA, normal by my interpretation.    COORDINATION OF CARE: 2:16 AM-Discussed treatment plan which includes labs and pain medication with pt at bedside and pt agreed to plan.    Labs Review Labs Reviewed  URINALYSIS, ROUTINE W REFLEX MICROSCOPIC (NOT AT [facility]) - Abnormal; Notable for the following:    APPearance CLOUDY (*)    Leukocytes, UA TRACE (*)    All other components within normal limits  URINE MICROSCOPIC-ADD ON - Abnormal; Notable for the following:    Squamous Epithelial / LPF 6-30 (*)    Bacteria, UA FEW (*)    All other components within normal limits  I-STAT CHEM 8, ED - Abnormal; Notable for the following:    BUN 4 (*)    Calcium, Ion 1.06 (*)    Hemoglobin 15.6 (*)    All other components within normal limits  URINE CULTURE    Imaging Review Ct Renal Stone Study  11/15/2015  CLINICAL DATA:  Acute onset of left-sided abdominal pain for 4 days. Initial encounter. EXAM: CT ABDOMEN AND PELVIS WITHOUT CONTRAST TECHNIQUE: Multidetector CT imaging of the abdomen and pelvis was performed following the  standard protocol without IV contrast. COMPARISON:  CT of the abdomen and pelvis performed 11/11/2010 FINDINGS: The visualized lung bases are clear. Mild coronary artery calcification is noted. The liver and spleen are unremarkable in appearance. The gallbladder is within normal limits. The pancreas and adrenal glands are unremarkable. A 5 mm nonobstructing stone is noted at the interpole region of the left kidney. The kidneys are otherwise unremarkable. There is no evidence of hydronephrosis. No obstructing ureteral stones are seen. No perinephric stranding is appreciated. No free fluid is identified. The small bowel is unremarkable in appearance. The stomach is within normal limits. No acute vascular abnormalities are seen. Scattered calcification is noted along the abdominal aorta. The appendix is decompressed. The cecum is noted anterior to the liver. There is no evidence for  appendicitis. The colon is largely decompressed and unremarkable in appearance. The bladder is mildly distended and grossly unremarkable. The patient is status post hysterectomy. No suspicious adnexal masses are seen. The ovaries are relatively symmetric. No inguinal lymphadenopathy is seen. No acute osseous abnormalities are identified. IMPRESSION: 1. No acute abnormality seen within the abdomen or pelvis. 2. 5 mm nonobstructing stone at the interpole region of the left kidney. No evidence of hydronephrosis. 3. Mild coronary artery calcification noted. 4. Scattered calcification along the abdominal aorta and its branches. Electronically Signed   By: Garald Balding M.D.   On: 11/15/2015 03:19      EKG Interpretation None      MDM   Final diagnoses:  Left sided abdominal pain  Acute left flank pain   I personally performed the services described in this documentation, which was scribed in my presence. The recorded information has been reviewed and is accurate.  Pt comes in with cc of flank pain. She has LLQ tenderness on  exam along with the flank region. Hx is not typical of a kidney stone, so we got a CT scan to ensure there is no diverticulitis - and CT effectively ruled out both and pyelonephritis.  Advised pcp f/u, as the etiology is unknown at this time.    Varney Biles, MD 11/15/15 [phone]

## 2015-11-30 ENCOUNTER — Other Ambulatory Visit: Payer: Self-pay

## 2015-11-30 DIAGNOSIS — Z1231 Encounter for screening mammogram for malignant neoplasm of breast: Secondary | ICD-10-CM

## 2015-12-06 ENCOUNTER — Other Ambulatory Visit: Payer: Self-pay | Admitting: Family

## 2015-12-16 ENCOUNTER — Other Ambulatory Visit: Payer: Self-pay | Admitting: Hematology & Oncology

## 2015-12-20 HISTORY — PX: EXTREMITY CYST EXCISION: SHX1558

## 2015-12-24 ENCOUNTER — Ambulatory Visit: Payer: Medicare Other

## 2016-01-03 ENCOUNTER — Other Ambulatory Visit: Payer: Self-pay | Admitting: Hematology & Oncology

## 2016-01-04 ENCOUNTER — Other Ambulatory Visit: Payer: Self-pay | Admitting: Hematology & Oncology

## 2016-02-02 ENCOUNTER — Other Ambulatory Visit: Payer: Self-pay | Admitting: Family

## 2016-02-04 ENCOUNTER — Ambulatory Visit (HOSPITAL_BASED_OUTPATIENT_CLINIC_OR_DEPARTMENT_OTHER): Payer: Medicare Other | Admitting: Hematology & Oncology

## 2016-02-04 ENCOUNTER — Encounter: Payer: Self-pay | Admitting: Hematology & Oncology

## 2016-02-04 ENCOUNTER — Other Ambulatory Visit (HOSPITAL_BASED_OUTPATIENT_CLINIC_OR_DEPARTMENT_OTHER): Payer: Medicare Other

## 2016-02-04 VITALS — BP 142/89 | HR 112 | Temp 98.3°F | Resp 20 | Ht 62.0 in | Wt 146.0 lb

## 2016-02-04 DIAGNOSIS — Z862 Personal history of diseases of the blood and blood-forming organs and certain disorders involving the immune mechanism: Secondary | ICD-10-CM

## 2016-02-04 DIAGNOSIS — F419 Anxiety disorder, unspecified: Secondary | ICD-10-CM | POA: Diagnosis not present

## 2016-02-04 DIAGNOSIS — I151 Hypertension secondary to other renal disorders: Secondary | ICD-10-CM

## 2016-02-04 DIAGNOSIS — I10 Essential (primary) hypertension: Secondary | ICD-10-CM | POA: Diagnosis not present

## 2016-02-04 DIAGNOSIS — N2889 Other specified disorders of kidney and ureter: Secondary | ICD-10-CM

## 2016-02-04 DIAGNOSIS — F064 Anxiety disorder due to known physiological condition: Secondary | ICD-10-CM

## 2016-02-04 LAB — CBC WITH DIFFERENTIAL (CANCER CENTER ONLY)
BASO#: 0.1 10*3/uL (ref 0.0–0.2)
BASO%: 1.1 % (ref 0.0–2.0)
EOS%: 4.8 % (ref 0.0–7.0)
Eosinophils Absolute: 0.3 10*3/uL (ref 0.0–0.5)
HEMATOCRIT: 39 % (ref 34.8–46.6)
HGB: 13.5 g/dL (ref 11.6–15.9)
LYMPH#: 2.2 10*3/uL (ref 0.9–3.3)
LYMPH%: 42.1 % (ref 14.0–48.0)
MCH: 35.8 pg — ABNORMAL HIGH (ref 26.0–34.0)
MCHC: 34.6 g/dL (ref 32.0–36.0)
MCV: 103 fL — ABNORMAL HIGH (ref 81–101)
MONO#: 0.8 10*3/uL (ref 0.1–0.9)
MONO%: 14.3 % — ABNORMAL HIGH (ref 0.0–13.0)
NEUT#: 2 10*3/uL (ref 1.5–6.5)
NEUT%: 37.7 % — AB (ref 39.6–80.0)
PLATELETS: 159 10*3/uL (ref 145–400)
RBC: 3.77 10*6/uL (ref 3.70–5.32)
RDW: 12.8 % (ref 11.1–15.7)
WBC: 5.3 10*3/uL (ref 3.9–10.0)

## 2016-02-04 LAB — COMPREHENSIVE METABOLIC PANEL (CC13)
ALBUMIN: 3.8 g/dL (ref 3.5–5.5)
ALT: 12 IU/L (ref 0–32)
AST: 21 IU/L (ref 0–40)
Albumin/Globulin Ratio: 1.4 (ref 1.1–2.5)
Alkaline Phosphatase, S: 72 IU/L (ref 39–117)
BUN / CREAT RATIO: 7 — AB (ref 9–23)
BUN: 4 mg/dL — AB (ref 6–24)
CHLORIDE: 105 mmol/L (ref 96–106)
Calcium, Ser: 9.7 mg/dL (ref 8.7–10.2)
Carbon Dioxide, Total: 23 mmol/L (ref 18–29)
Creatinine, Ser: 0.54 mg/dL — ABNORMAL LOW (ref 0.57–1.00)
GFR calc Af Amer: 129 mL/min/{1.73_m2} (ref >59)
GFR calc non Af Amer: 112 mL/min/{1.73_m2} (ref >59)
GLOBULIN, TOTAL: 2.7 g/dL (ref 1.5–4.5)
GLUCOSE: 98 mg/dL (ref 65–99)
Potassium, Ser: 3.4 mmol/L — ABNORMAL LOW (ref 3.5–5.2)
SODIUM: 139 mmol/L (ref 134–144)
Total Protein: 6.5 g/dL (ref 6.0–8.5)

## 2016-02-04 LAB — CHCC SATELLITE - SMEAR

## 2016-02-04 MED ORDER — ALPRAZOLAM 1 MG PO TABS: ORAL_TABLET | ORAL | Status: DC

## 2016-02-04 MED ORDER — VALSARTAN 80 MG PO TABS: 80.0000 mg | ORAL_TABLET | Freq: Every day | ORAL | Status: DC

## 2016-02-04 NOTE — Progress Notes (Signed)
Hematology and Oncology Follow Up Visit  Wendy Edwards [id number] [birth year] 50 y.o. 02/04/2016   Principle Diagnosis:  1. Aplastic anemia - clinical remission. 2. Chronic anxiety. 3. Hypertension.  Current Therapy:    Observation     Interim History:  Ms.  Edwards is for followup. She's not feeling too well. She has a cough. She has tightness in her chest. She is still smoking. I'm unsure she will ever stop smoking.  She is not taking any blood pressure medicines anymore. I'm not sure as to why she is not taking blood pressure medicine. I will have to reorder this for her.  She still does not had a mammogram. The last one was done back in October 2015.  There's been no diarrhea. She's had no nausea or vomiting.  She does have some easy bruising. I think that this is probably from the Motrin that she takes.  Overall, her performance status is ECOG 1. Medications:  Current outpatient prescriptions:  .  albuterol (ACCUNEB) 1.25 MG/3ML nebulizer solution, TAKE 3 ML(1 VIAL) BY NEBULIZATION 3 TIMES DAILY AS NEEDED FOR WHEEZING., Disp: 75 mL, Rfl: 1 .  ALPRAZolam (XANAX) 1 MG tablet, TAKE 1 TABLET BY MOUTH 3 TIMES A DAY AS NEEDED FOR ANXIETY, Disp: 90 tablet, Rfl: 1 .  ibuprofen (ADVIL,MOTRIN) 200 MG tablet, Take 400 mg by mouth every 6 (six) hours as needed for headache., Disp: , Rfl:  .  ibuprofen (ADVIL,MOTRIN) 800 MG tablet, Take 1 tablet (800 mg total) by mouth every 8 (eight) hours as needed., Disp: 21 tablet, Rfl: 0 .  PROAIR HFA 108 (90 Base) MCG/ACT inhaler, TAKE 2 PUFFS EVERY 6 HOURS AS NEEDED, Disp: 8.5 Inhaler, Rfl: 4 .  promethazine (PHENERGAN) 25 MG tablet, Take 1 tablet (25 mg total) by mouth every 6 (six) hours as needed for nausea., Disp: 10 tablet, Rfl: 0 .  traMADol (ULTRAM) 50 MG tablet, Take 1 tablet (50 mg total) by mouth every 6 (six) hours as needed., Disp: 15 tablet, Rfl: 0 .  valsartan (DIOVAN) 80 MG tablet, Take 1 tablet (80 mg total) by mouth daily., Disp: 30  tablet, Rfl: 12  Allergies: No Known Allergies  Past Medical History, Surgical history, Social history, and Family History were reviewed and updated.  Review of Systems: As above  Physical Exam:  height is 5\' 2"  (1.575 m) and weight is 146 lb (66.225 kg). Her oral temperature is 98.3 F (36.8 C). Her blood pressure is 142/89 and her pulse is 112. Her respiration is 20.   Well-developed well-nourished white female. Head and neck exam shows no ocular or oral lesion. There is no palpable cervical or supraclavicular lymph nodes. Lungs are somewhat tight. She has wheezes bilaterally. She has relatively decent air movement. Cardiac exam regular rate and rhythm with no murmurs, rubs or bruits.. Abdomen is soft. She has good bowel sounds. There is no fluid wave. There is no palpable liver or spleen tip. Extremities shows no clubbing, cyanosis or edema. She has decreased range of motion of the right shoulder. There is some point tenderness in the posterior right shoulder. Skin exam shows no rashes, ecchymosis or petechia. Neurological exam is nonfocal.  Lab Results  Component Value Date   WBC 5.3 02/04/2016   HGB 13.5 02/04/2016   HCT 39.0 02/04/2016   MCV 103* 02/04/2016   PLT 159 02/04/2016     Chemistry      Component Value Date/Time   NA 143 11/15/2015 0407   NA 144 12/24/2010  1126   K 4.5 11/15/2015 0407   K 4.3 12/24/2010 1126   CL 106 11/15/2015 0407   CL 103 12/24/2010 1126   CO2 24 10/25/2015 0008   CO2 27 12/24/2010 1126   BUN 4* 11/15/2015 0407   BUN 5* 12/24/2010 1126   CREATININE 0.90 11/15/2015 0407   CREATININE 0.4* 12/24/2010 1126      Component Value Date/Time   CALCIUM 9.3 10/25/2015 0008   CALCIUM 9.2 12/24/2010 1126   ALKPHOS 55 08/05/2015 1411   AST 22 08/05/2015 1411   ALT 19 08/05/2015 1411   BILITOT 0.4 08/05/2015 1411         Impression and Plan: Wendy Edwards is 50 year old white female. She has a remote history of aplastic anemia. She was treated  with immunosuppression therapy with anti-thrombocyte globulin. This probably was 16 years ago.  I am happy now. Her blood counts have stabilized and improved.  For now, we will just a sure that she gets her blood pressure medicine. She is post have her mammogram this or next month.  Hopefully, she will cut back on her cigarettes. She has less stress in her life.  We will plan to get her back in 6 months.  Wendy Napoleon, MD 2/16/20174:15 PM

## 2016-04-20 ENCOUNTER — Other Ambulatory Visit: Payer: Self-pay | Admitting: Hematology & Oncology

## 2016-04-28 ENCOUNTER — Other Ambulatory Visit: Payer: Self-pay | Admitting: *Deleted

## 2016-04-28 DIAGNOSIS — N2889 Other specified disorders of kidney and ureter: Principal | ICD-10-CM

## 2016-04-28 DIAGNOSIS — F064 Anxiety disorder due to known physiological condition: Secondary | ICD-10-CM

## 2016-04-28 DIAGNOSIS — I151 Hypertension secondary to other renal disorders: Secondary | ICD-10-CM

## 2016-04-28 MED ORDER — VALSARTAN 80 MG PO TABS: 80.0000 mg | ORAL_TABLET | Freq: Every day | ORAL | Status: DC

## 2016-05-24 IMAGING — CT CT RENAL STONE PROTOCOL
2 of 3 series · 15 of 28 positions shown, 17 images · non-contrast
Comparison: CT of the abdomen and pelvis performed 11/11/2010

CLINICAL DATA: Acute onset of left-sided abdominal pain for 4 days.
Initial encounter.

EXAM:
CT ABDOMEN AND PELVIS WITHOUT CONTRAST
TECHNIQUE: Multidetector CT imaging of the abdomen and pelvis was performed
following the standard protocol without IV contrast.

[Series 3: coronal · coronal · 0.83mm/px · 3 of 88 slices shown]
[im 30/88  soft-tissue]
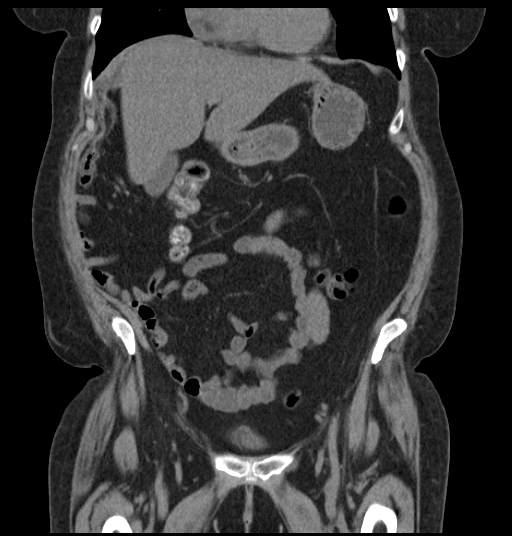
[im 39/88  soft-tissue]
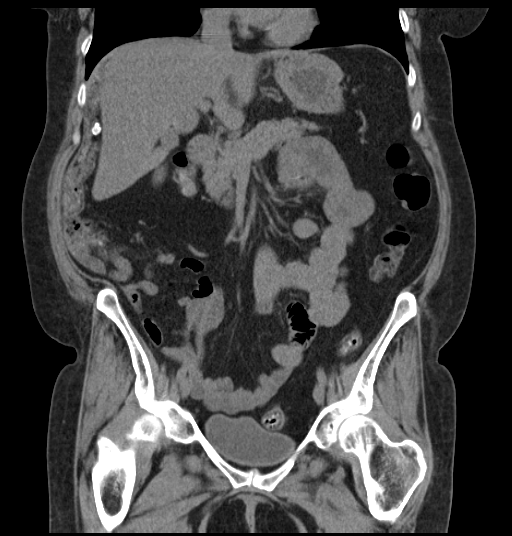
[im 49/88  soft-tissue]
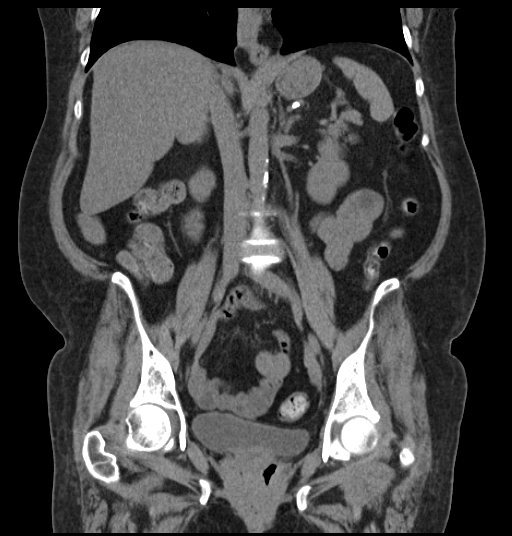

[Series 6: lung · axial · 0.82mm/px · z∈[-41,+14]mm · 12 of 14 slices shown, 14 images]
[im 2/14  soft-tissue]
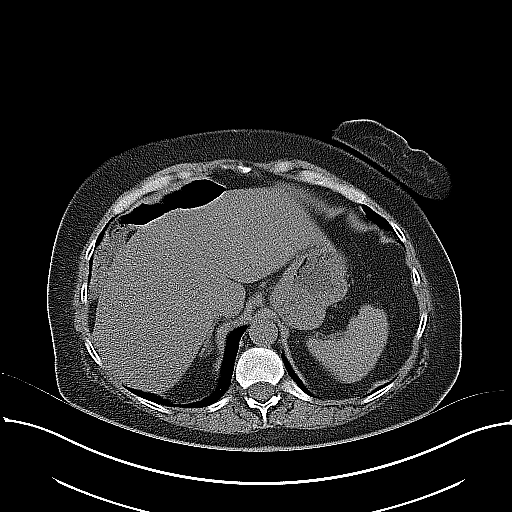
[im 2/14  bone]
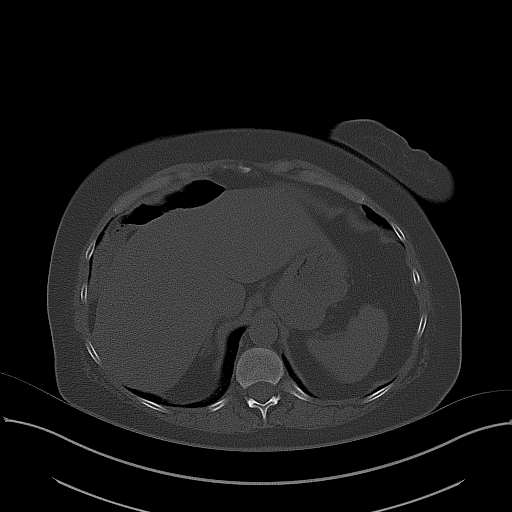
[im 3/14  soft-tissue]
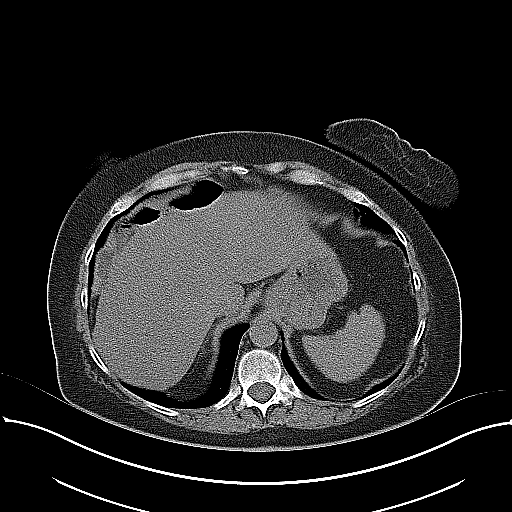
[im 4/14  soft-tissue]
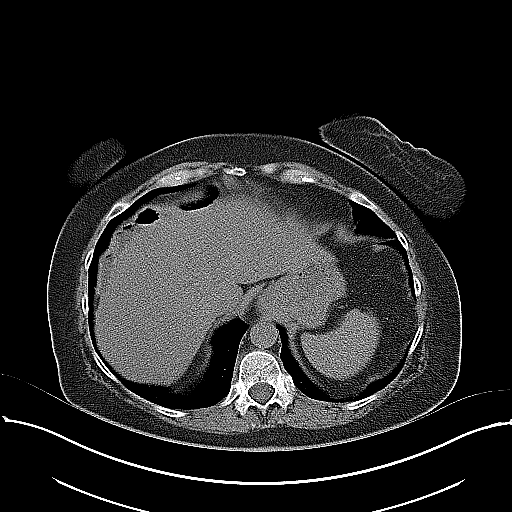
[im 5/14  soft-tissue]
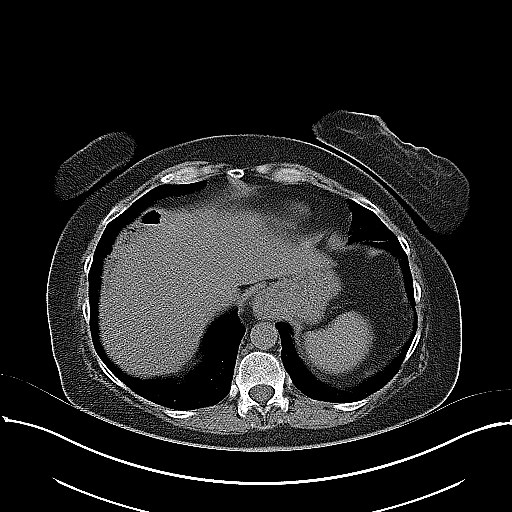
[im 6/14  soft-tissue]
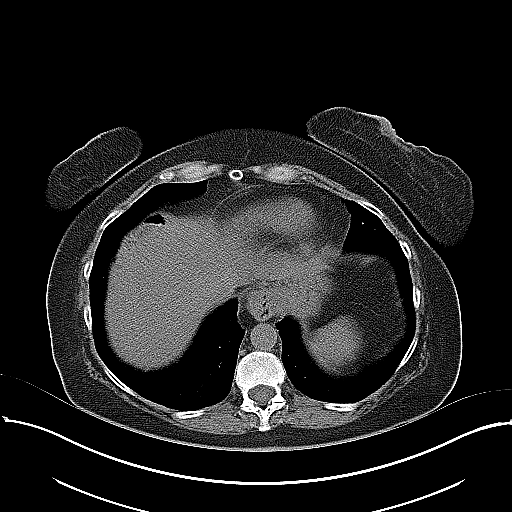
[im 7/14  soft-tissue]
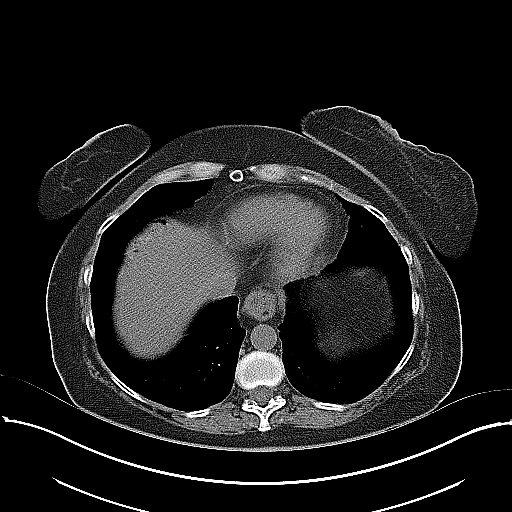
[im 8/14  soft-tissue]
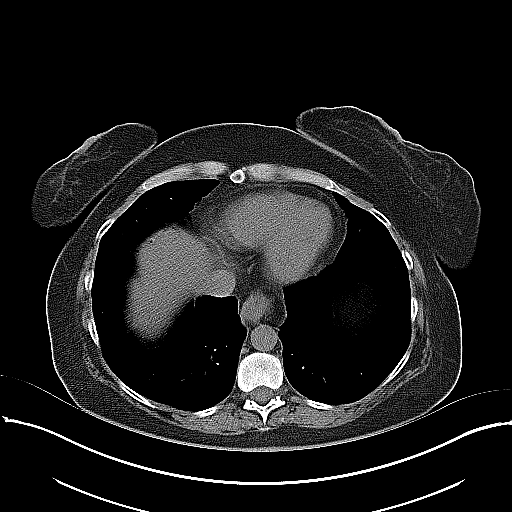
[im 9/14  soft-tissue]
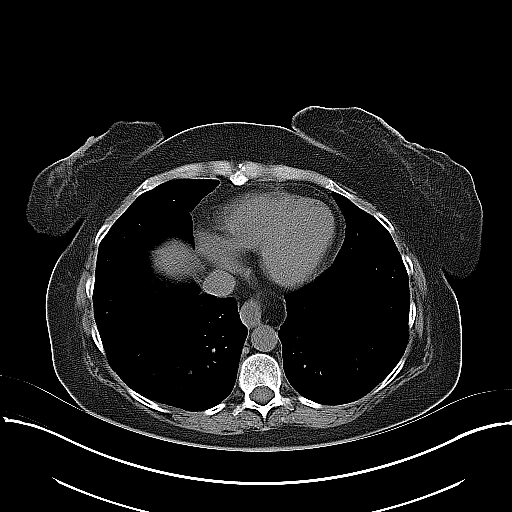
[im 10/14  soft-tissue]
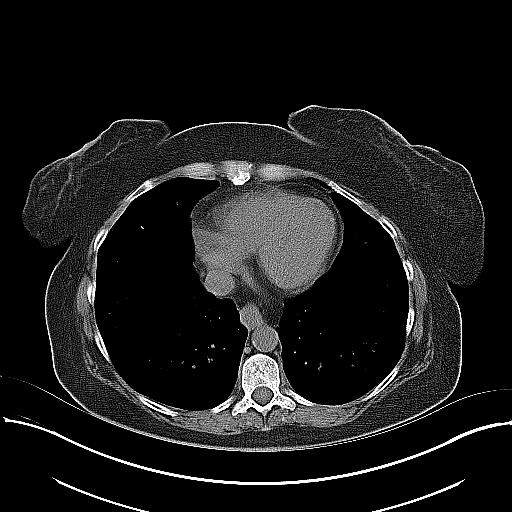
[im 10/14  bone]
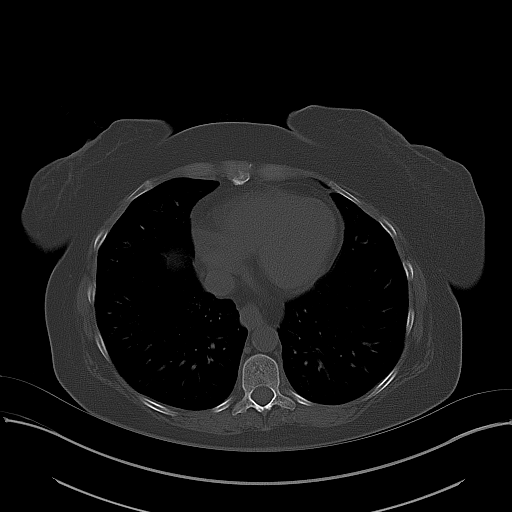
[im 11/14  soft-tissue]
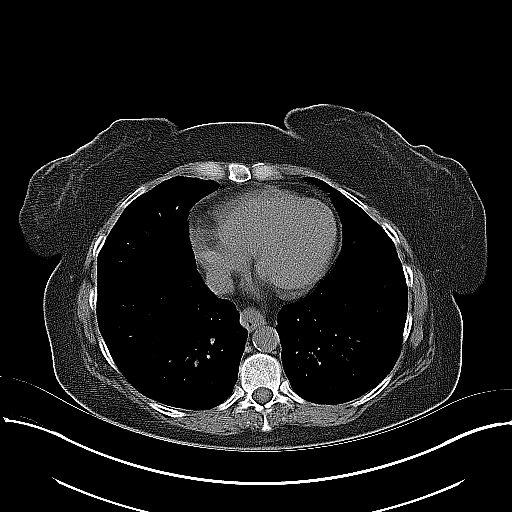
[im 12/14  soft-tissue]
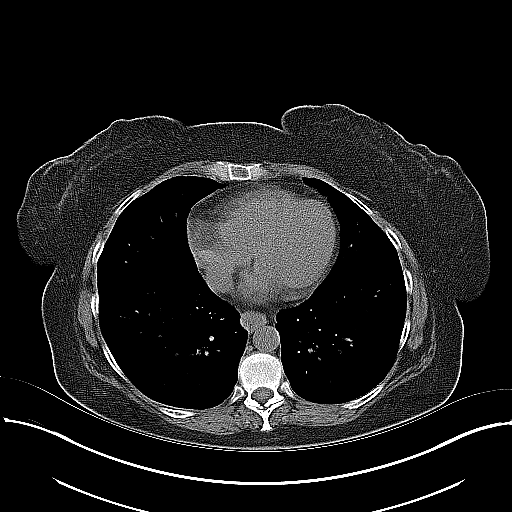
[im 13/14  soft-tissue]
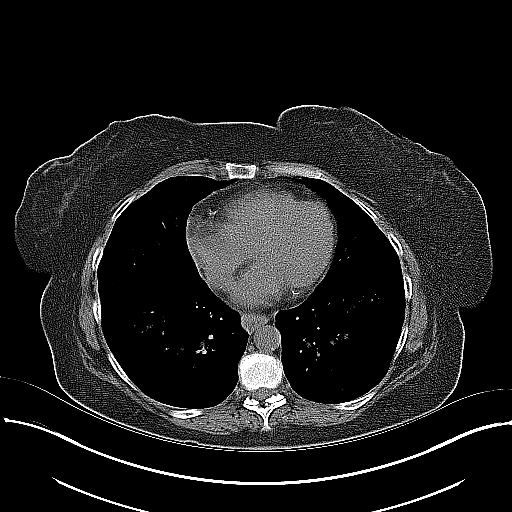

[15 of 28 positions shown; findings below may reference images not displayed]

FINDINGS: The visualized lung bases are clear. Mild coronary artery
calcification is noted.

The liver and spleen are unremarkable in appearance. The gallbladder
is within normal limits. The pancreas and adrenal glands are
unremarkable.

A 5 mm nonobstructing stone is noted at the interpole region of the
left kidney. The kidneys are otherwise unremarkable. There is no
evidence of hydronephrosis. No obstructing ureteral stones are seen.
No perinephric stranding is appreciated.

No free fluid is identified. The small bowel is unremarkable in
appearance. The stomach is within normal limits. No acute vascular
abnormalities are seen. Scattered calcification is noted along the
abdominal aorta.

The appendix is decompressed. The cecum is noted anterior to the
liver. There is no evidence for appendicitis. The colon is largely
decompressed and unremarkable in appearance.

The bladder is mildly distended and grossly unremarkable. The
patient is status post hysterectomy. No suspicious adnexal masses
are seen. The ovaries are relatively symmetric. No inguinal
lymphadenopathy is seen.

No acute osseous abnormalities are identified.
IMPRESSION: 1. No acute abnormality seen within the abdomen or pelvis.
2. 5 mm nonobstructing stone at the interpole region of the left
kidney. No evidence of hydronephrosis.
3. Mild coronary artery calcification noted.
4. Scattered calcification along the abdominal aorta and its
branches.

## 2016-06-14 ENCOUNTER — Other Ambulatory Visit: Payer: Self-pay | Admitting: Hematology & Oncology

## 2016-06-24 ENCOUNTER — Ambulatory Visit (INDEPENDENT_AMBULATORY_CARE_PROVIDER_SITE_OTHER): Payer: Medicare Other | Admitting: Physician Assistant

## 2016-06-24 ENCOUNTER — Ambulatory Visit: Payer: Medicare Other

## 2016-06-24 VITALS — BP 148/90 | HR 97 | Temp 98.4°F | Resp 18 | Ht 63.0 in | Wt 140.0 lb

## 2016-06-24 DIAGNOSIS — L723 Sebaceous cyst: Secondary | ICD-10-CM | POA: Diagnosis not present

## 2016-06-24 DIAGNOSIS — L089 Local infection of the skin and subcutaneous tissue, unspecified: Secondary | ICD-10-CM

## 2016-06-24 DIAGNOSIS — R110 Nausea: Secondary | ICD-10-CM | POA: Diagnosis not present

## 2016-06-24 NOTE — Patient Instructions (Addendum)
Apply a warm compress to the area for 15-20 minutes 2-4 times each day. Use Ibuprofen 200 to 400 mg orally every 4 to 6 hours as needed for pain as needed. Change the dressing daily.  If symptoms worsen or do not improve, please come back. Otherwise, follow up in 3 days for reevaluation.  It does not appear that you need antibiotics at this time due to the minimal amount of pus drained. However, if the area does not look like it is healing in 24-48 and is still hot and red to the touch, will consider antibiotics.   IF you received an x-ray today, you will receive an invoice from [facility]. Please contact [facility] at [phone] with questions or concerns regarding your invoice.   IF you received labwork today, you will receive an invoice from [employer]. Please contact [facility] at [phone] with questions or concerns regarding your invoice.   Our billing staff will not be able to assist you with questions regarding bills from these companies.  You will be contacted with the lab results as soon as they are available. The fastest way to get your results is to activate your My Chart account. Instructions are located on the last page of this paperwork. If you have not heard from [place] regarding the results in 2 weeks, please contact this office.

## 2016-06-24 NOTE — Progress Notes (Signed)
Wendy Edwards  MRN: [id number] DOB: 16-Apr-1966  Subjective:  Wendy Edwards is a 51 y.o. female seen in office today for a chief complaint of left arm infection. Pt states that the affected area started out as "a white mole" two years ago and had not changed in that time. However, two days ago she woke up and noticed it was red and hard. Very painful to the touch. Reports that it is getting bigger in size. Denies pus drainage, fever, chills, diaphoresis, trouble swallowing. Has tried tylenol with minimal relief of pain. Has not noticed any tick or insect bite. Has no hx of MRSA.   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 This was in 2000 after being stuck with IV needle in hospital. Developed thrombophlebitis and a blood clot developed. Was put on blood thinner for sometime after this event. Pt does smoke 10 cigarettes a day for approximately 20ish years. Is not on any exogenous estrogen.   No PCP.  Review of Systems  Gastrointestinal: Positive for nausea (pt reports she kind of always feels nauseated so this is not new for her). Negative for vomiting, abdominal pain, diarrhea and constipation.  Allergic/Immunologic: Positive for food allergies.  Neurological: Negative for weakness and numbness.    Patient Active Problem List   Diagnosis Date Noted  . Hx of aplastic anemia 10/31/2012  . HTN (hypertension) 06/19/2012  . Chest pain, atypical 05/18/2012  . Tobacco abuse 05/18/2012  . Hiatal hernia 05/18/2012    Current Outpatient Prescriptions on File Prior to Visit  Medication Sig Dispense Refill  . albuterol (ACCUNEB) 1.25 MG/3ML nebulizer solution TAKE 3 ML(1 VIAL) BY NEBULIZATION 3 TIMES DAILY AS NEEDED FOR WHEEZING. 75 mL 1  . ALPRAZolam (XANAX) 1 MG tablet TAKE 1 TABLET BY MOUTH 3 TIMES A DAY AS NEEDED FOR ANXIETY 90 tablet 1  . PROAIR HFA 108 (90 Base) MCG/ACT inhaler TAKE 2 PUFFS EVERY 6 HOURS AS NEEDED 8.5 Inhaler 4  . valsartan (DIOVAN)  80 MG tablet Take 1 tablet (80 mg total) by mouth daily. 90 tablet 3  . ibuprofen (ADVIL,MOTRIN) 200 MG tablet Take 400 mg by mouth every 6 (six) hours as needed for headache. Reported on 06/24/2016    . ibuprofen (ADVIL,MOTRIN) 800 MG tablet Take 1 tablet (800 mg total) by mouth every 8 (eight) hours as needed. (Patient not taking: Reported on 06/24/2016) 21 tablet 0  . promethazine (PHENERGAN) 25 MG tablet Take 1 tablet (25 mg total) by mouth every 6 (six) hours as needed for nausea. (Patient not taking: Reported on 06/24/2016) 10 tablet 0  . traMADol (ULTRAM) 50 MG tablet Take 1 tablet (50 mg total) by mouth every 6 (six) hours as needed. (Patient not taking: Reported on 06/24/2016) 15 tablet 0   No current facility-administered medications on file prior to visit.    No Known Allergies  Objective:  BP 148/90 mmHg  Pulse 97  Temp(Src) 98.4 F (36.9 C) (Oral)  Resp 18  Ht 5\' 3"  (1.6 m)  Wt 140 lb (63.504 kg)  BMI 24.81 kg/m2  SpO2 99%  Physical Exam  Constitutional: She is oriented to person, place, and time and well-developed, well-nourished, and in no distress.  HENT:  Head: Normocephalic and atraumatic.  Eyes: Conjunctivae are normal.  Neck: Normal range of motion.  Cardiovascular: Normal rate, regular rhythm and normal heart sounds.   Pulmonary/Chest: Effort normal.  Musculoskeletal:  Arms: Neurological: She is alert and oriented to person, place, and time. Gait normal.  Skin: Skin is warm and dry.  Psychiatric: Affect normal.  Vitals reviewed.    PROCEDURE NOTE: sebaceous cyst excision Verbal consent obtained. Local anesthesia with 2cc of 2% lidocaine. Area cleansed with betadine.   An incision was made extending ~1cm using a 11 blade through the puncta. Minimal pus was initially expressed as well as sebaceous debris and capsule. The cyst was dissected from the surrounding tissue with curved hemostats. Packed with 0.25" packing.  Wound cleansed and  dressed.    Assessment and Plan :  1. Infected sebaceous cyst -Apply a warm compress to the area for 15-20 minutes 2-4 times each day. Use Ibuprofen 200 to 400 mg orally every 4 to 6 hours as needed for pain as needed. Change the dressing daily.  If symptoms worsen or do not improve, please come back. Otherwise, follow up in 3 days for reevaluation.  -It does not appear that antibiotics are needed at this time due to the minimal amount of pus drained. -Since all cyst debris was removed as well as the cyst capsule, will not prescribe antibiotics at this time but if no improvement in 24-48 hrs, will prescribe antibiotics.    Tenna Delaine PA-C  [facility] 06/24/2016 1:45 PM

## 2016-06-27 ENCOUNTER — Encounter: Payer: Self-pay | Admitting: Physician Assistant

## 2016-06-27 LAB — WOUND CULTURE: ORGANISM ID, BACTERIA: NO GROWTH

## 2016-07-06 ENCOUNTER — Ambulatory Visit: Payer: Medicare Other

## 2016-07-06 ENCOUNTER — Other Ambulatory Visit: Payer: Self-pay | Admitting: Family

## 2016-08-04 ENCOUNTER — Ambulatory Visit: Payer: Medicare Other | Admitting: Hematology & Oncology

## 2016-08-04 ENCOUNTER — Other Ambulatory Visit: Payer: Medicare Other

## 2016-08-07 ENCOUNTER — Other Ambulatory Visit: Payer: Self-pay | Admitting: Hematology & Oncology

## 2016-08-08 ENCOUNTER — Other Ambulatory Visit: Payer: Self-pay | Admitting: Hematology & Oncology

## 2016-09-02 ENCOUNTER — Ambulatory Visit: Payer: Medicare Other | Admitting: Hematology & Oncology

## 2016-09-02 ENCOUNTER — Other Ambulatory Visit: Payer: Medicare Other

## 2016-09-04 ENCOUNTER — Other Ambulatory Visit: Payer: Self-pay | Admitting: Hematology & Oncology

## 2016-09-19 ENCOUNTER — Other Ambulatory Visit (HOSPITAL_BASED_OUTPATIENT_CLINIC_OR_DEPARTMENT_OTHER): Payer: Medicare Other

## 2016-09-19 ENCOUNTER — Ambulatory Visit (HOSPITAL_BASED_OUTPATIENT_CLINIC_OR_DEPARTMENT_OTHER): Payer: Medicare Other | Admitting: Hematology & Oncology

## 2016-09-19 ENCOUNTER — Encounter: Payer: Self-pay | Admitting: Hematology & Oncology

## 2016-09-19 VITALS — BP 154/96 | HR 138 | Temp 97.8°F | Resp 20 | Ht 63.0 in | Wt 135.1 lb

## 2016-09-19 DIAGNOSIS — R590 Localized enlarged lymph nodes: Secondary | ICD-10-CM

## 2016-09-19 DIAGNOSIS — F064 Anxiety disorder due to known physiological condition: Secondary | ICD-10-CM

## 2016-09-19 DIAGNOSIS — Z862 Personal history of diseases of the blood and blood-forming organs and certain disorders involving the immune mechanism: Secondary | ICD-10-CM

## 2016-09-19 DIAGNOSIS — R161 Splenomegaly, not elsewhere classified: Secondary | ICD-10-CM

## 2016-09-19 DIAGNOSIS — Z720 Tobacco use: Secondary | ICD-10-CM | POA: Diagnosis not present

## 2016-09-19 DIAGNOSIS — I10 Essential (primary) hypertension: Secondary | ICD-10-CM | POA: Diagnosis not present

## 2016-09-19 DIAGNOSIS — N2889 Other specified disorders of kidney and ureter: Secondary | ICD-10-CM

## 2016-09-19 DIAGNOSIS — I151 Hypertension secondary to other renal disorders: Secondary | ICD-10-CM

## 2016-09-19 LAB — CBC WITH DIFFERENTIAL (CANCER CENTER ONLY)
BASO#: 0 10*3/uL (ref 0.0–0.2)
BASO%: 0.5 % (ref 0.0–2.0)
EOS ABS: 0.1 10*3/uL (ref 0.0–0.5)
EOS%: 1.6 % (ref 0.0–7.0)
HEMATOCRIT: 45 % (ref 34.8–46.6)
HEMOGLOBIN: 15.6 g/dL (ref 11.6–15.9)
LYMPH#: 1.6 10*3/uL (ref 0.9–3.3)
LYMPH%: 25.3 % (ref 14.0–48.0)
MCH: 35.9 pg — AB (ref 26.0–34.0)
MCHC: 34.7 g/dL (ref 32.0–36.0)
MCV: 103 fL — ABNORMAL HIGH (ref 81–101)
MONO#: 1 10*3/uL — AB (ref 0.1–0.9)
MONO%: 15.1 % — ABNORMAL HIGH (ref 0.0–13.0)
NEUT%: 57.5 % (ref 39.6–80.0)
NEUTROS ABS: 3.6 10*3/uL (ref 1.5–6.5)
Platelets: 88 10*3/uL — ABNORMAL LOW (ref 145–400)
RBC: 4.35 10*6/uL (ref 3.70–5.32)
RDW: 13.4 % (ref 11.1–15.7)
WBC: 6.3 10*3/uL (ref 3.9–10.0)

## 2016-09-19 LAB — CMP (CANCER CENTER ONLY)
ALBUMIN: 4.5 g/dL (ref 3.3–5.5)
ALT(SGPT): 31 U/L (ref 10–47)
AST: 29 U/L (ref 11–38)
Alkaline Phosphatase: 83 U/L (ref 26–84)
BILIRUBIN TOTAL: 0.7 mg/dL (ref 0.20–1.60)
BUN, Bld: 6 mg/dL — ABNORMAL LOW (ref 7–22)
CALCIUM: 10 mg/dL (ref 8.0–10.3)
CO2: 25 meq/L (ref 18–33)
Chloride: 97 mEq/L — ABNORMAL LOW (ref 98–108)
Creat: 0.6 mg/dl (ref 0.6–1.2)
Glucose, Bld: 84 mg/dL (ref 73–118)
Potassium: 4.8 mEq/L — ABNORMAL HIGH (ref 3.3–4.7)
Sodium: 139 mEq/L (ref 128–145)
Total Protein: 7.7 g/dL (ref 6.4–8.1)

## 2016-09-19 MED ORDER — PROCHLORPERAZINE MALEATE 10 MG PO TABS: 10.0000 mg | ORAL_TABLET | Freq: Three times a day (TID) | ORAL | 0 refills | Status: DC | PRN

## 2016-09-19 NOTE — Progress Notes (Signed)
Hematology and Oncology Follow Up Visit  KALON DIMOCK [id number] 02/25/1966 50 y.o. 09/19/2016   Principle Diagnosis:  1. Aplastic anemia - clinical remission. 2. Chronic anxiety. 3. Hypertension.  Current Therapy:    Observation     Interim History:  Ms.  Munsch is for followup. She's not feeling too well. She has a cough. She has tightness in her chest. She is still smoking. I'm do not think that she will ever stop smoking.  She has had some areas of bruising. She says whenever she cuts herself, she bleeds a little bit more briskly.   She has had a decreased appetite. She continues to lose weight. I'm not sure as to why she is losing the weight.   She has had no fever. There has been no change in bowel or bladder habits. She has had a kidney stone in the left kidney in the past.   She has had no rashes. She has had no mouth sores.   Overall, her performance status is ECOG 1.   Medications:  Current Outpatient Prescriptions:  .  albuterol (ACCUNEB) 1.25 MG/3ML nebulizer solution, TAKE 3 ML(1 VIAL) BY NEBULIZATION 3 TIMES DAILY AS NEEDED FOR WHEEZING., Disp: 75 mL, Rfl: 1 .  ALPRAZolam (XANAX) 1 MG tablet, TAKE 1 TABLET BY MOUTH 3 TIMES A DAY AS NEEDED FOR ANXIETY, Disp: 90 tablet, Rfl: 1 .  PROAIR HFA 108 (90 Base) MCG/ACT inhaler, TAKE 2 PUFFS EVERY 6 HOURS AS NEEDED, Disp: 8.5 Inhaler, Rfl: 4 .  valsartan (DIOVAN) 80 MG tablet, Take 1 tablet (80 mg total) by mouth daily., Disp: 90 tablet, Rfl: 3  Allergies:  Allergies  Allergen Reactions  . Bee Venom Hives    Past Medical History, Surgical history, Social history, and Family History were reviewed and updated.  Review of Systems: As above  Physical Exam:  height is 5\' 3"  (1.6 m) and weight is 135 lb 1.3 oz (61.3 kg). Her oral temperature is 97.8 F (36.6 C). Her blood pressure is 154/96 (abnormal) and her pulse is 138 (abnormal). Her respiration is 20.   Well-developed well-nourished white female. Head and neck  exam shows no ocular or oral lesion. There is no palpable cervical or supraclavicular lymph nodes.She does have some candida on her tongue. Lungs are somewhat tight. She has wheezes bilaterally. She has relatively decent air movement. Cardiac exam regular rate and rhythm with no murmurs, rubs or bruits.. Her axillary exam shows some. The left axilla. Abdomen is soft. She has good bowel sounds. There is no fluid wave. There is no palpable liver edge. Her spleen tip might be palpable with deep inspiration. Extremities shows no clubbing, cyanosis or edema. She has decreased range of motion of the right shoulder. There is some point tenderness in the posterior right shoulder. Skin exam shows no rashes, ecchymosis or petechia. Neurological exam is nonfocal.  Lab Results  Component Value Date   WBC 6.3 09/19/2016   HGB 15.6 09/19/2016   HCT 45.0 09/19/2016   MCV 103 (H) 09/19/2016   PLT 88 (L) 09/19/2016     Chemistry      Component Value Date/Time   NA 139 09/19/2016 1339   K 4.8 (H) 09/19/2016 1339   CL 97 (L) 09/19/2016 1339   CO2 25 09/19/2016 1339   BUN 6 (L) 09/19/2016 1339   CREATININE 0.6 09/19/2016 1339      Component Value Date/Time   CALCIUM 10.0 09/19/2016 1339   ALKPHOS 83 09/19/2016 1339   AST  29 09/19/2016 1339   ALT 31 09/19/2016 1339   BILITOT 0.70 09/19/2016 1339         Impression and Plan: Ms. Volner is 50 year old white female. She has a remote history of aplastic anemia. She was treated with immunosuppression therapy with anti-thrombocyte globulin. This probably was 16 years ago.  I am not sure as to why she is losing weight. I am not sure as to why her platelet count is low. I looked at her blood and the microscope. I do not see anything that looked suspicious. I do not see that looked like a hematologic problem.  With her heavy smoking, I think we will have to do a CT scan on her. I thought I could feel some left axillary lymph nodes that are enlarged. Her spleen  may also be slightly enlarged.   I will get this set up in the next week or so.   Unfortunately, she has no family doctor. We have tried to get her a family doctor. She was goes to urgent care whenever she has an issue.   I would like to see her back in one month. We will get the scans set up in about 2 weeks.   I spent about 30 minutes with she and her friend    .[name], MD 10/2/20172:39 PM

## 2016-09-19 NOTE — Addendum Note (Signed)
Addended by: Burney Gauze R on: 09/19/2016 02:50 PM   Modules accepted: Orders

## 2016-10-03 ENCOUNTER — Ambulatory Visit (HOSPITAL_COMMUNITY)
Admission: RE | Admit: 2016-10-03 | Discharge: 2016-10-03 | Disposition: A | Discharge status: Home / Self Care | Payer: Medicare Other | Source: Ambulatory Visit | Attending: Hematology & Oncology | Admitting: Hematology & Oncology

## 2016-10-03 ENCOUNTER — Encounter (HOSPITAL_COMMUNITY): Payer: Self-pay

## 2016-10-03 DIAGNOSIS — N200 Calculus of kidney: Secondary | ICD-10-CM | POA: Diagnosis not present

## 2016-10-03 DIAGNOSIS — I2510 Atherosclerotic heart disease of native coronary artery without angina pectoris: Secondary | ICD-10-CM | POA: Diagnosis not present

## 2016-10-03 DIAGNOSIS — R590 Localized enlarged lymph nodes: Secondary | ICD-10-CM | POA: Insufficient documentation

## 2016-10-03 DIAGNOSIS — R161 Splenomegaly, not elsewhere classified: Secondary | ICD-10-CM | POA: Diagnosis not present

## 2016-10-03 DIAGNOSIS — K449 Diaphragmatic hernia without obstruction or gangrene: Secondary | ICD-10-CM | POA: Insufficient documentation

## 2016-10-03 DIAGNOSIS — Z862 Personal history of diseases of the blood and blood-forming organs and certain disorders involving the immune mechanism: Secondary | ICD-10-CM

## 2016-10-03 DIAGNOSIS — D619 Aplastic anemia, unspecified: Secondary | ICD-10-CM | POA: Insufficient documentation

## 2016-10-03 MED ORDER — IOPAMIDOL (ISOVUE-300) INJECTION 61%
100.0000 mL | Freq: Once | INTRAVENOUS | Status: AC | PRN
  Administered 2016-10-03: 100 mL via INTRAVENOUS

## 2016-10-04 ENCOUNTER — Telehealth: Payer: Self-pay | Admitting: *Deleted

## 2016-10-04 NOTE — Telephone Encounter (Addendum)
Patient aware of results  ----- Message from [name], MD sent at 10/03/2016  5:08 PM EDT ----- Call - the CT scan is negative for any obvious cancer!!  pete

## 2016-10-27 ENCOUNTER — Other Ambulatory Visit: Payer: Medicare Other

## 2016-10-27 ENCOUNTER — Ambulatory Visit: Payer: Medicare Other | Admitting: Hematology & Oncology

## 2016-10-31 ENCOUNTER — Other Ambulatory Visit: Payer: Self-pay | Admitting: Hematology & Oncology

## 2016-11-13 ENCOUNTER — Emergency Department (HOSPITAL_COMMUNITY): Payer: Medicare Other

## 2016-11-13 ENCOUNTER — Emergency Department (HOSPITAL_COMMUNITY)
Admission: EM | Admit: 2016-11-13 | Discharge: 2016-11-13 | Disposition: A | Discharge status: Home / Self Care | Payer: Medicare Other | Attending: Emergency Medicine | Admitting: Emergency Medicine

## 2016-11-13 ENCOUNTER — Encounter (HOSPITAL_COMMUNITY): Payer: Self-pay

## 2016-11-13 DIAGNOSIS — I10 Essential (primary) hypertension: Secondary | ICD-10-CM | POA: Diagnosis not present

## 2016-11-13 DIAGNOSIS — F17210 Nicotine dependence, cigarettes, uncomplicated: Secondary | ICD-10-CM | POA: Diagnosis not present

## 2016-11-13 DIAGNOSIS — J45909 Unspecified asthma, uncomplicated: Secondary | ICD-10-CM | POA: Diagnosis not present

## 2016-11-13 DIAGNOSIS — M25561 Pain in right knee: Secondary | ICD-10-CM | POA: Diagnosis not present

## 2016-11-13 MED ORDER — TRAMADOL HCL 50 MG PO TABS: 50.0000 mg | ORAL_TABLET | Freq: Four times a day (QID) | ORAL | 0 refills | Status: DC | PRN

## 2016-11-13 NOTE — Discharge Instructions (Signed)
Please read attached information. If you experience any new or worsening signs or symptoms please return to the emergency room for evaluation. Please follow-up with your primary care provider or specialist as discussed. Please use medication prescribed only as directed and discontinue taking if you have any concerning signs or symptoms.   °

## 2016-11-13 NOTE — ED Provider Notes (Signed)
[facility] DEPT Provider Note   CSN: [id number] Arrival date & time: 11/13/16  1230  By signing my name below, I, Gwenlyn Fudge, attest that this documentation has been prepared under the direction and in the presence of [employer], PA-C. Electronically Signed: Gwenlyn Fudge, ED Scribe. 11/13/16. 2:23 PM.  History   Chief Complaint Chief Complaint  Patient presents with  . Knee Pain   The history is provided by the patient. No language interpreter was used.   HPI Comments:   Wendy Edwards is a 50 y.o. female with PMHx of HTN, Asthma and GERD who presents to the Emergency Department complaining of gradual onset, episodic, right knee swelling and pain onset 7 PM. She notes pain usually follows after her knee "gives out" on her. She denies recent increased activity. She has experienced similar symptoms before, but notes that the swelling and pain is more severe. Pain is exacerbated with weight bearing and movement. Symptoms normally self resolve with time. She takes Ibuprofen with no relief to pain. She does not have any PMHx of Arthritis or Gout. Pt has hx of injury and multiple surgeries to right knee. She denies fever, nausea, vomiting.    Past Medical History:  Diagnosis Date  . Anxiety   . Aplastic anemia ([place])   . Asthma   . GERD (gastroesophageal reflux disease)   . Hypertension   . Kidney stones     Patient Active Problem List   Diagnosis Date Noted  . Hx of aplastic anemia 10/31/2012  . HTN (hypertension) 06/19/2012  . Tobacco abuse 05/18/2012    Past Surgical History:  Procedure Laterality Date  . ABDOMINAL HYSTERECTOMY    . KNEE SURGERY     Right x 2  . TUNNELED VENOUS CATHETER PLACEMENT      OB History    No data available     Home Medications    Prior to Admission medications   Medication Sig Start Date End Date Taking? Authorizing Provider  albuterol (ACCUNEB) 1.25 MG/3ML nebulizer solution TAKE 3 ML(1 VIAL) BY NEBULIZATION 3 TIMES DAILY AS  NEEDED FOR WHEEZING. 07/07/16   Eliezer Bottom, NP  ALPRAZolam Duanne Moron) 1 MG tablet TAKE 1 TABLET BY MOUTH 3 TIMES A DAY AS NEEDED FOR ANXIETY 10/31/16   [name], MD  PROAIR HFA 108 [phone] Base) MCG/ACT inhaler TAKE 2 PUFFS EVERY 6 HOURS AS NEEDED 09/05/16   [name], MD  prochlorperazine (COMPAZINE) 10 MG tablet Take 1 tablet (10 mg total) by mouth every 8 (eight) hours as needed for nausea or vomiting. 09/19/16   [name], MD  traMADol (ULTRAM) 50 MG tablet Take 1 tablet (50 mg total) by mouth every 6 (six) hours as needed. 11/13/16   Okey Regal, PA-C  valsartan (DIOVAN) 80 MG tablet Take 1 tablet (80 mg total) by mouth daily. 04/28/16   [name], MD   Family History Family History  Problem Relation Age of Onset  . Coronary artery disease Father 52    CABG  . Cancer Father   . Hypertension Mother   . Cancer Mother    Social History Social History  Substance Use Topics  . Smoking status: Current Every Day Smoker    Packs/day: 0.50    Years: 27.00    Types: Cigarettes    Start date: 03/01/1988  . Smokeless tobacco: Never Used     Comment:  03-06-15  STILL SMOKING  does not want to quit  . Alcohol use 0.0  oz/week   Allergies   Bee venom  Review of Systems Review of Systems  Constitutional: Negative for fever.  Gastrointestinal: Negative for nausea and vomiting.  Musculoskeletal: Positive for arthralgias and joint swelling.  All other systems reviewed and are negative.  Physical Exam Updated Vital Signs BP 170/97   Pulse 116   Temp 98.4 F (36.9 C) (Oral)   Resp 18   SpO2 95%   Physical Exam  Constitutional: She is oriented to person, place, and time. She appears well-developed and well-nourished. She is active. No distress.  HENT:  Head: Normocephalic and atraumatic.  Eyes: Conjunctivae are normal.  Cardiovascular: Normal rate.   Pulmonary/Chest: Effort normal. No respiratory distress.  Musculoskeletal: Normal range of motion.  No  obvious swelling to the knee No redness or warmth to touch Significant tenderness to palpation along medial joint line Pt unable to extend knee due to discomfort Post surgical scars noted  Neurological: She is alert and oriented to person, place, and time.  Skin: Skin is warm and dry.  Psychiatric: She has a normal mood and affect. Her behavior is normal.  Nursing note and vitals reviewed.  ED Treatments / Results  DIAGNOSTIC STUDIES: Oxygen Saturation is 95% on RA, adequate by my interpretation.    COORDINATION OF CARE: 2:19 PM Discussed treatment plan with pt at bedside which includes knee immobilizer and Tramadol and pt agreed to plan.  Labs (all labs ordered are listed, but only abnormal results are displayed) Labs Reviewed - No data to display  EKG  EKG Interpretation None       Radiology Dg Knee Complete 4 Views Right  Result Date: 11/13/2016 CLINICAL DATA:  Patient with acute onset knee pain. No known injury. Initial encounter. EXAM: RIGHT KNEE - COMPLETE 4+ VIEW COMPARISON:  Knee radiograph 02/04/2009. FINDINGS: Patient status post patellofemoral arthroplasty. Hardware appears intact. Medial and lateral compartment degenerative changes. No evidence for acute fracture. Small joint effusion. IMPRESSION: No acute osseous abnormality. Degenerative changes. Small joint effusion. Electronically Signed   By: Lovey Newcomer M.D.   On: 11/13/2016 13:34    Procedures Procedures (including critical care time)  Medications Ordered in ED Medications - No data to display   Initial Impression / Assessment and Plan / ED Course  I have reviewed the triage vital signs and the nursing notes.  Pertinent labs & imaging results that were available during my care of the patient were reviewed by me and considered in my medical decision making (see chart for details).  Clinical Course      50 year old female presents today with right knee pain. Patient has a history of the same, waxing  and waning. She does note that swelling is worse today. She has no signs of infectious etiology, I have very low suspicion for septic arthritis, gouty arthritis. Patient does have a small joint effusion, this could likely be due to her underlying degenerative changes.. Patient will be placed in a knee immobilizer, given crutches, encouraged follow-up with orthopedics. She is instructed return to emergency room if she develops any worsening signs or symptoms. She verbalized understanding and agreement to today's plan had no further questions or concerns  I personally performed the services described in this documentation, which was scribed in my presence. The recorded information has been reviewed and is accurate.   Final Clinical Impressions(s) / ED Diagnoses   Final diagnoses:  Right knee pain, unspecified chronicity    New Prescriptions Discharge Medication List as of 11/13/2016  2:38 PM  START taking these medications   Details  traMADol (ULTRAM) 50 MG tablet Take 1 tablet (50 mg total) by mouth every 6 (six) hours as needed., Starting Sun 11/13/2016, Print         Okey Regal, PA-C 11/13/16 1618    Dorie Rank, MD 11/13/16 [phone]

## 2016-11-13 NOTE — ED Triage Notes (Signed)
Pt has previous surgery to rt knee.  Pt started having pain at 7pm last night.  Denies fall or new injury.  Swollen at site per patient.

## 2016-12-21 ENCOUNTER — Other Ambulatory Visit: Payer: Self-pay | Admitting: Family

## 2017-01-12 ENCOUNTER — Other Ambulatory Visit (HOSPITAL_COMMUNITY): Payer: Self-pay | Admitting: Orthopaedic Surgery

## 2017-01-12 DIAGNOSIS — M25561 Pain in right knee: Secondary | ICD-10-CM

## 2017-01-13 ENCOUNTER — Other Ambulatory Visit: Payer: Self-pay | Admitting: Hematology & Oncology

## 2017-01-20 ENCOUNTER — Other Ambulatory Visit: Payer: Self-pay | Admitting: Hematology & Oncology

## 2017-01-24 ENCOUNTER — Encounter (HOSPITAL_COMMUNITY)
Admission: RE | Admit: 2017-01-24 | Discharge: 2017-01-24 | Disposition: A | Discharge status: Home / Self Care | Payer: Medicare Other | Source: Ambulatory Visit | Attending: Orthopaedic Surgery | Admitting: Orthopaedic Surgery

## 2017-01-24 DIAGNOSIS — M25561 Pain in right knee: Secondary | ICD-10-CM | POA: Diagnosis present

## 2017-01-24 MED ORDER — TECHNETIUM TC 99M MEDRONATE IV KIT
25.0000 | PACK | Freq: Once | INTRAVENOUS | Status: AC | PRN
  Administered 2017-01-24: 25 via INTRAVENOUS

## 2017-02-10 ENCOUNTER — Other Ambulatory Visit: Payer: Self-pay | Admitting: Orthopedic Surgery

## 2017-02-15 ENCOUNTER — Encounter (HOSPITAL_COMMUNITY)
Admission: RE | Admit: 2017-02-15 | Discharge: 2017-02-15 | Disposition: A | Discharge status: Home / Self Care | Payer: Medicare Other | Source: Ambulatory Visit | Attending: Orthopedic Surgery | Admitting: Orthopedic Surgery

## 2017-02-15 ENCOUNTER — Ambulatory Visit (HOSPITAL_COMMUNITY)
Admission: RE | Admit: 2017-02-15 | Discharge: 2017-02-15 | Disposition: A | Discharge status: Home / Self Care | Payer: Medicare Other | Source: Ambulatory Visit | Attending: Orthopedic Surgery | Admitting: Orthopedic Surgery

## 2017-02-15 ENCOUNTER — Encounter (HOSPITAL_COMMUNITY): Payer: Self-pay

## 2017-02-15 DIAGNOSIS — Z01818 Encounter for other preprocedural examination: Secondary | ICD-10-CM

## 2017-02-15 HISTORY — DX: Personal history of urinary calculi: Z87.442

## 2017-02-15 HISTORY — DX: Anemia, unspecified: D64.9

## 2017-02-15 HISTORY — DX: Unspecified osteoarthritis, unspecified site: M19.90

## 2017-02-15 LAB — URINALYSIS, ROUTINE W REFLEX MICROSCOPIC
BILIRUBIN URINE: NEGATIVE
GLUCOSE, UA: NEGATIVE mg/dL
HGB URINE DIPSTICK: NEGATIVE
Ketones, ur: NEGATIVE mg/dL
NITRITE: NEGATIVE
PROTEIN: NEGATIVE mg/dL
Specific Gravity, Urine: 1.014 (ref 1.005–1.030)
pH: 5 (ref 5.0–8.0)

## 2017-02-15 LAB — CBC WITH DIFFERENTIAL/PLATELET
Basophils Absolute: 0 10*3/uL (ref 0.0–0.1)
Basophils Relative: 0 %
Eosinophils Absolute: 0.2 10*3/uL (ref 0.0–0.7)
Eosinophils Relative: 2 %
HEMATOCRIT: 43 % (ref 36.0–46.0)
HEMOGLOBIN: 14.5 g/dL (ref 12.0–15.0)
LYMPHS ABS: 1.9 10*3/uL (ref 0.7–4.0)
LYMPHS PCT: 27 %
MCH: 33.3 pg (ref 26.0–34.0)
MCHC: 33.7 g/dL (ref 30.0–36.0)
MCV: 98.9 fL (ref 78.0–100.0)
MONOS PCT: 11 %
Monocytes Absolute: 0.8 10*3/uL (ref 0.1–1.0)
NEUTROS ABS: 4.3 10*3/uL (ref 1.7–7.7)
NEUTROS PCT: 60 %
Platelets: 203 10*3/uL (ref 150–400)
RBC: 4.35 MIL/uL (ref 3.87–5.11)
RDW: 13.5 % (ref 11.5–15.5)
WBC: 7.2 10*3/uL (ref 4.0–10.5)

## 2017-02-15 LAB — BASIC METABOLIC PANEL
ANION GAP: 9 (ref 5–15)
BUN: 8 mg/dL (ref 6–20)
CHLORIDE: 104 mmol/L (ref 101–111)
CO2: 26 mmol/L (ref 22–32)
Calcium: 9.9 mg/dL (ref 8.9–10.3)
Creatinine, Ser: 0.62 mg/dL (ref 0.44–1.00)
GFR calc non Af Amer: >60 mL/min (ref >60)
Glucose, Bld: 106 mg/dL — ABNORMAL HIGH (ref 65–99)
POTASSIUM: 4 mmol/L (ref 3.5–5.1)
Sodium: 139 mmol/L (ref 135–145)

## 2017-02-15 LAB — SURGICAL PCR SCREEN
MRSA, PCR: NEGATIVE
Staphylococcus aureus: NEGATIVE

## 2017-02-15 LAB — TYPE AND SCREEN
ABO/RH(D): O POS
ANTIBODY SCREEN: NEGATIVE

## 2017-02-15 LAB — PROTIME-INR
INR: 1.09
Prothrombin Time: 14.1 seconds (ref 11.4–15.2)

## 2017-02-15 LAB — APTT: aPTT: 28 seconds (ref 24–36)

## 2017-02-15 LAB — ABO/RH: ABO/RH(D): O POS

## 2017-02-15 NOTE — Pre-Procedure Instructions (Signed)
Wendy Edwards  02/15/2017      [id number] [address], [place] - [place] [address] Phone: [phone] Fax: [phone]    Your procedure is scheduled on March 5  Report to [facility] at 1025 A.M.  Call this number if you have problems the morning of surgery:  [phone]   Remember:  Do not eat food or drink liquids after midnight.  Take these medicines the morning of surgery with A SIP OF WATER albuterol neb if needed, a;prazolam (Xanax) if needed, Proair inhaler if needed- bring your inhalers with you on the day of surgery  Stop taking aspirin, BC's, Goody's, Herbal medications, Fish Oil, Ibuprofen, Advil, Motrin, Aleve, Vitamins    Do not wear jewelry, make-up or nail polish.  Do not wear lotions, powders, or perfumes, or deoderant.  Do not shave 48 hours prior to surgery.  Men may shave face and neck.  Do not bring valuables to the hospital.  [facility] is not responsible for any belongings or valuables.  Contacts, dentures or bridgework may not be worn into surgery.  Leave your suitcase in the car.  After surgery it may be brought to your room.  For patients admitted to the hospital, discharge time will be determined by your treatment team.  Patients discharged the day of surgery will not be allowed to drive home.    Special instructions:  [facility] - Preparing for Surgery  Before surgery, you can play an important role.  Because skin is not sterile, your skin needs to be as free of germs as possible.  You can reduce the number of germs on you skin by washing with CHG (chlorahexidine gluconate) soap before surgery.  CHG is an antiseptic cleaner which kills germs and bonds with the skin to continue killing germs even after washing.  Please DO NOT use if you have an allergy to CHG or antibacterial soaps.  If your skin becomes reddened/irritated stop using the  CHG and inform your nurse when you arrive at Short Stay.  Do not shave (including legs and underarms) for at least 48 hours prior to the first CHG shower.  You may shave your face.  Please follow these instructions carefully:   1.  Shower with CHG Soap the night before surgery and the                                morning of Surgery.  2.  If you choose to wash your hair, wash your hair first as usual with your       normal shampoo.  3.  After you shampoo, rinse your hair and body thoroughly to remove the                      Shampoo.  4.  Use CHG as you would any other liquid soap.  You can apply chg directly       to the skin and wash gently with scrungie or a clean washcloth.  5.  Apply the CHG Soap to your body ONLY FROM THE NECK DOWN.        Do not use on open wounds or open sores.  Avoid contact with your eyes,       ears, mouth and genitals (private parts).  Wash genitals (private parts)  with your normal soap.  6.  Wash thoroughly, paying special attention to the area where your surgery        will be performed.  7.  Thoroughly rinse your body with warm water from the neck down.  8.  DO NOT shower/wash with your normal soap after using and rinsing off       the CHG Soap.  9.  Pat yourself dry with a clean towel.            10.  Wear clean pajamas.            11.  Place clean sheets on your bed the night of your first shower and do not        sleep with pets.  Day of Surgery  Do not apply any lotions/deoderants the morning of surgery.  Please wear clean clothes to the hospital/surgery center.     Please read over the following fact sheets that you were given. Pain Booklet, MRSA Information and Surgical Site Infection Prevention

## 2017-02-15 NOTE — Progress Notes (Addendum)
Denies having a pcp states she goes to urgent care when needed. States doesn't remember seeing a heart dr,but if she did it was years ago. Denies ever having a card cath, stress test, or echo. Echo noted in epic from 2013 Denies a recent EKG. Instructed not to smoke on the day of surgery, voices understanding. Heart rate today 113-126.  Pt states she doesn't know why her heart rate is up, but she did just smoke a cigarette.  Loletta Specter, FNP called and informed.

## 2017-02-15 NOTE — Progress Notes (Signed)
Anesthesia Chart Review:  Pt is a 51 year old female scheduled for R total knee revision on 02/20/2017 with Frederik Pear, MD.   - No PCP.  - Hem-onc is Burney Gauze, MD.   PMH includes: HTN, asthma, aplastic anemia, GERD.  Current smoker. BMI 28.5  Medications include: albuterol, valsartan.   Preoperative labs reviewed.    CXR 02/15/17: No active cardiopulmonary disease.  EKG 02/15/17: sinus tachycardia (113 bpm).   Stress echo 06/15/12: Normal study after maximal exercise. Bruce protocol. Stress echocardiography.   Called to see pt at PAT for tachycardia. HR upon arrival to PAT was 138; by the end of PAT visit it was 113.  Pt denies palpitations, tachycardia, dizziness, syncope, chest pain, SOB.  Was not aware of tachycardia.  Heart tachycardic, rhythm regular.  Lungs CTA. Sinus tach on EKG. Review of previous encounters in Epic reveal persistent tachycardia ranging 101-138 since 2015.  Pt was not aware.  Pt does not have PCP. Encouraged pt to engage with PCP and get tachycardia evaluated.   If no changes, I anticipate pt can proceed with surgery as scheduled.   Lorenzo Vanmarter, FNP-BC [facility]/Anesthesiology Phone: [phone] 02/15/2017 5:07 PM

## 2017-02-16 DIAGNOSIS — Z96659 Presence of unspecified artificial knee joint: Secondary | ICD-10-CM

## 2017-02-16 DIAGNOSIS — T8484XA Pain due to internal orthopedic prosthetic devices, implants and grafts, initial encounter: Secondary | ICD-10-CM

## 2017-02-16 NOTE — H&P (Signed)
TOTAL KNEE REVISION ADMISSION H&P  Patient is being admitted for right revision total knee arthroplasty.  Subjective:  Chief Complaint:right knee pain.  HPI: Wendy Edwards, 51 y.o. female, has a history of pain and functional disability in the right knee(s) due to failed previous arthroplasty and patient has failed non-surgical conservative treatments for greater than 12 weeks to include NSAID's and/or analgesics, flexibility and strengthening excercises, use of assistive devices and activity modification. The indications for the revision of the total knee arthroplasty are bearing surface wear leading to symptomatic synovitis and implant or knee misalignment. Onset of symptoms was gradual starting 2 years ago with gradually worsening course since that time.  Prior procedures on the right knee(s) include unicompartmental arthroplasty and specifically the patella femoral joint.  Patient currently rates pain in the right knee(s) at 10 out of 10 with activity. There is night pain, worsening of pain with activity and weight bearing, pain that interferes with activities of daily living, pain with passive range of motion and crepitus.  Patient has evidence of joint space narrowing by imaging studies. This condition presents safety issues increasing the risk of falls.   There is no current active infection.  Patient Active Problem List   Diagnosis Date Noted  . Hx of aplastic anemia 10/31/2012  . HTN (hypertension) 06/19/2012  . Tobacco abuse 05/18/2012   Past Medical History:  Diagnosis Date  . Anemia   . Anxiety   . Aplastic anemia ([place])   . Arthritis   . Asthma   . GERD (gastroesophageal reflux disease)   . History of kidney stones   . Hypertension     Past Surgical History:  Procedure Laterality Date  . ABDOMINAL HYSTERECTOMY    . KNEE SURGERY     Right x 2  . TUNNELED VENOUS CATHETER PLACEMENT     [place]    No prescriptions prior to admission.   Allergies  Allergen Reactions  .  Bee Venom Hives    Social History  Substance Use Topics  . Smoking status: Current Every Day Smoker    Packs/day: 0.50    Years: 27.00    Types: Cigarettes    Start date: 03/01/1988  . Smokeless tobacco: Never Used     Comment:  03-06-15  STILL SMOKING  does not want to quit  . Alcohol use 0.0 oz/week     Comment: occ    Family History  Problem Relation Age of Onset  . Coronary artery disease Father 64    CABG  . Cancer Father   . Hypertension Mother   . Cancer Mother       Review of Systems  Constitutional: Positive for diaphoresis and malaise/fatigue. Negative for weight loss.  HENT: Negative.   Eyes: Negative.   Respiratory: Negative.   Cardiovascular: Negative.   Gastrointestinal: Negative.   Genitourinary: Negative.   Musculoskeletal: Positive for joint pain.  Skin: Negative.   Neurological: Negative.   Endo/Heme/Allergies: Negative.   Psychiatric/Behavioral: Positive for depression. The patient is nervous/anxious and has insomnia.      Objective:  Physical Exam  Constitutional: She is oriented to person, place, and time. She appears well-developed and well-nourished.  HENT:  Head: Normocephalic and atraumatic.  Eyes: Pupils are equal, round, and reactive to light.  Neck: Normal range of motion. Neck supple.  Cardiovascular: Intact distal pulses.   Respiratory: Effort normal.  Musculoskeletal: She exhibits tenderness.  Patient's right knee has a range from roughly 15 to 90.  No noticeable fluid.  She does have mild crepitance with range of motion.  Tenderness with palpation over the patellofemoral region.  Mild lateral joint line tenderness.  No instability.  Calves are soft and nontender.  Neurological: She is alert and oriented to person, place, and time.  Skin: Skin is warm and dry.  Psychiatric: She has a normal mood and affect. Her behavior is normal. Judgment and thought content normal.    Vital signs in last 24 hours: Temp:  [98.4 F (36.9 C)] 98.4  F (36.9 C) (02/28 1506) Resp:  [20] 20 (02/28 1506) BP: (149)/(85) 149/85 (02/28 1506) SpO2:  [96 %] 96 % (02/28 1506) Weight:  [73 kg (160 lb 14.4 oz)] 73 kg (160 lb 14.4 oz) (02/28 1506)  Labs:  Estimated body mass index is 28.5 kg/m as calculated from the following:   Height as of 02/15/17: 5\' 3"  (1.6 m).   Weight as of 02/15/17: 73 kg (160 lb 14.4 oz).  Imaging Review Plain radiographs demonstrate right knee including tibial plateau shows a patellofemoral replacement.  No evidence of significant fracture or malalignment.  Well maintained joint space minimal medial narrowing.  Assessment/Plan:  End stage arthritis, right knee(s) with failed previous arthroplasty.   The patient history, physical examination, clinical judgment of the provider and imaging studies are consistent with end stage degenerative joint disease of the right knee(s), previous total knee arthroplasty. Revision total knee arthroplasty is deemed medically necessary. The treatment options including medical management, injection therapy, arthroscopy and revision arthroplasty were discussed at length. The risks and benefits of revision total knee arthroplasty were presented and reviewed. The risks due to aseptic loosening, infection, stiffness, patella tracking problems, thromboembolic complications and other imponderables were discussed. The patient acknowledged the explanation, agreed to proceed with the plan and consent was signed. Patient is being admitted for inpatient treatment for surgery, pain control, PT, OT, prophylactic antibiotics, VTE prophylaxis, progressive ambulation and ADL's and discharge planning.The patient is planning to be discharged home with home health services

## 2017-02-17 MED ORDER — TRANEXAMIC ACID 1000 MG/10ML IV SOLN
2000.0000 mg | INTRAVENOUS | Status: AC
  Administered 2017-02-20: 2000 mg via TOPICAL
  Filled 2017-02-17: qty 20

## 2017-02-17 MED ORDER — TRANEXAMIC ACID 1000 MG/10ML IV SOLN
1000.0000 mg | INTRAVENOUS | Status: AC
  Administered 2017-02-20: 1000 mg via INTRAVENOUS
  Filled 2017-02-17: qty 10

## 2017-02-17 MED ORDER — DEXTROSE-NACL 5-0.45 % IV SOLN: INTRAVENOUS | Status: DC

## 2017-02-17 MED ORDER — BUPIVACAINE LIPOSOME 1.3 % IJ SUSP
20.0000 mL | Freq: Once | INTRAMUSCULAR | Status: AC
  Administered 2017-02-20: 20 mL
  Filled 2017-02-17: qty 20

## 2017-02-17 MED ORDER — CEFAZOLIN SODIUM-DEXTROSE 2-4 GM/100ML-% IV SOLN
2.0000 g | INTRAVENOUS | Status: AC
  Administered 2017-02-20: 2 g via INTRAVENOUS
  Filled 2017-02-17: qty 100

## 2017-02-20 ENCOUNTER — Encounter (HOSPITAL_COMMUNITY)
Admission: RE | Disposition: A | Discharge status: Home Health | Payer: Self-pay | Source: Ambulatory Visit | Attending: Orthopedic Surgery

## 2017-02-20 ENCOUNTER — Inpatient Hospital Stay (HOSPITAL_COMMUNITY)
Admission: RE | Admit: 2017-02-20 | Discharge: 2017-02-22 | DRG: 468 | Disposition: A | Discharge status: Home Health | Payer: Medicare Other | Source: Ambulatory Visit | Attending: Orthopedic Surgery | Admitting: Orthopedic Surgery

## 2017-02-20 ENCOUNTER — Inpatient Hospital Stay (HOSPITAL_COMMUNITY): Payer: Medicare Other | Admitting: Emergency Medicine

## 2017-02-20 ENCOUNTER — Inpatient Hospital Stay (HOSPITAL_COMMUNITY): Payer: Medicare Other | Admitting: Anesthesiology

## 2017-02-20 ENCOUNTER — Encounter (HOSPITAL_COMMUNITY): Payer: Self-pay | Admitting: Urology

## 2017-02-20 DIAGNOSIS — F419 Anxiety disorder, unspecified: Secondary | ICD-10-CM | POA: Diagnosis present

## 2017-02-20 DIAGNOSIS — Z87442 Personal history of urinary calculi: Secondary | ICD-10-CM

## 2017-02-20 DIAGNOSIS — Y793 Surgical instruments, materials and orthopedic devices (including sutures) associated with adverse incidents: Secondary | ICD-10-CM | POA: Diagnosis not present

## 2017-02-20 DIAGNOSIS — Z96659 Presence of unspecified artificial knee joint: Secondary | ICD-10-CM

## 2017-02-20 DIAGNOSIS — J45909 Unspecified asthma, uncomplicated: Secondary | ICD-10-CM | POA: Diagnosis present

## 2017-02-20 DIAGNOSIS — K219 Gastro-esophageal reflux disease without esophagitis: Secondary | ICD-10-CM | POA: Diagnosis present

## 2017-02-20 DIAGNOSIS — F17210 Nicotine dependence, cigarettes, uncomplicated: Secondary | ICD-10-CM | POA: Diagnosis present

## 2017-02-20 DIAGNOSIS — I10 Essential (primary) hypertension: Secondary | ICD-10-CM | POA: Diagnosis present

## 2017-02-20 DIAGNOSIS — Z90710 Acquired absence of both cervix and uterus: Secondary | ICD-10-CM | POA: Diagnosis not present

## 2017-02-20 DIAGNOSIS — T8484XA Pain due to internal orthopedic prosthetic devices, implants and grafts, initial encounter: Secondary | ICD-10-CM

## 2017-02-20 DIAGNOSIS — M25561 Pain in right knee: Secondary | ICD-10-CM | POA: Diagnosis present

## 2017-02-20 DIAGNOSIS — Z8249 Family history of ischemic heart disease and other diseases of the circulatory system: Secondary | ICD-10-CM | POA: Diagnosis not present

## 2017-02-20 DIAGNOSIS — Z96651 Presence of right artificial knee joint: Secondary | ICD-10-CM

## 2017-02-20 DIAGNOSIS — Z809 Family history of malignant neoplasm, unspecified: Secondary | ICD-10-CM

## 2017-02-20 DIAGNOSIS — T84012A Broken internal right knee prosthesis, initial encounter: Secondary | ICD-10-CM | POA: Diagnosis present

## 2017-02-20 DIAGNOSIS — J449 Chronic obstructive pulmonary disease, unspecified: Secondary | ICD-10-CM | POA: Diagnosis present

## 2017-02-20 DIAGNOSIS — Z23 Encounter for immunization: Secondary | ICD-10-CM

## 2017-02-20 HISTORY — DX: Major depressive disorder, single episode, unspecified: F32.9

## 2017-02-20 HISTORY — DX: Pneumonia, unspecified organism: J18.9

## 2017-02-20 HISTORY — DX: Nonspecific elevation of levels of transaminase and lactic acid dehydrogenase (ldh): R74.0

## 2017-02-20 HISTORY — DX: Depression, unspecified: F32.A

## 2017-02-20 HISTORY — DX: Cardiac murmur, unspecified: R01.1

## 2017-02-20 HISTORY — DX: Migraine, unspecified, not intractable, without status migrainosus: G43.909

## 2017-02-20 HISTORY — DX: Personal history of other medical treatment: Z92.89

## 2017-02-20 HISTORY — DX: Other serum reaction due to other serum, initial encounter: T80.69XA

## 2017-02-20 HISTORY — DX: Headache, unspecified: R51.9

## 2017-02-20 HISTORY — DX: Acute embolism and thrombosis of unspecified deep veins of unspecified lower extremity: I82.409

## 2017-02-20 HISTORY — DX: Elevation of levels of liver transaminase levels: R74.01

## 2017-02-20 HISTORY — PX: TOTAL KNEE REVISION: SHX996

## 2017-02-20 HISTORY — DX: Headache: R51

## 2017-02-20 HISTORY — DX: Chronic obstructive pulmonary disease, unspecified: J44.9

## 2017-02-20 HISTORY — DX: Anxiety disorder, unspecified: F41.9

## 2017-02-20 SURGERY — TOTAL KNEE REVISION
Anesthesia: Regional | Laterality: Right

## 2017-02-20 MED ORDER — METHOCARBAMOL 500 MG PO TABS
500.0000 mg | ORAL_TABLET | Freq: Four times a day (QID) | ORAL | Status: DC | PRN
  Administered 2017-02-20 – 2017-02-22 (×7): 500 mg via ORAL
  Filled 2017-02-20 (×7): qty 1

## 2017-02-20 MED ORDER — ASPIRIN EC 325 MG PO TBEC: 325.0000 mg | DELAYED_RELEASE_TABLET | Freq: Two times a day (BID) | ORAL | 0 refills | Status: DC

## 2017-02-20 MED ORDER — ASPIRIN EC 325 MG PO TBEC
325.0000 mg | DELAYED_RELEASE_TABLET | Freq: Every day | ORAL | Status: DC
  Administered 2017-02-21 – 2017-02-22 (×2): 325 mg via ORAL
  Filled 2017-02-20 (×2): qty 1

## 2017-02-20 MED ORDER — INFLUENZA VAC SPLIT QUAD 0.5 ML IM SUSY
0.5000 mL | PREFILLED_SYRINGE | INTRAMUSCULAR | Status: AC
  Administered 2017-02-21: 0.5 mL via INTRAMUSCULAR
  Filled 2017-02-20: qty 0.5

## 2017-02-20 MED ORDER — PHENYLEPHRINE HCL 10 MG/ML IJ SOLN
INTRAVENOUS | Status: DC | PRN
  Administered 2017-02-20: 15 ug/min via INTRAVENOUS

## 2017-02-20 MED ORDER — PHENYLEPHRINE HCL 10 MG/ML IJ SOLN
INTRAMUSCULAR | Status: AC
  Filled 2017-02-20: qty 2

## 2017-02-20 MED ORDER — ACETAMINOPHEN 325 MG PO TABS
650.0000 mg | ORAL_TABLET | Freq: Four times a day (QID) | ORAL | Status: DC | PRN
  Administered 2017-02-20 – 2017-02-22 (×4): 650 mg via ORAL
  Filled 2017-02-20 (×4): qty 2

## 2017-02-20 MED ORDER — ALUM & MAG HYDROXIDE-SIMETH 200-200-20 MG/5ML PO SUSP
30.0000 mL | ORAL | Status: DC | PRN
  Administered 2017-02-22: 30 mL via ORAL
  Filled 2017-02-20: qty 30

## 2017-02-20 MED ORDER — MENTHOL 3 MG MT LOZG: 1.0000 | LOZENGE | OROMUCOSAL | Status: DC | PRN

## 2017-02-20 MED ORDER — KCL IN DEXTROSE-NACL 20-5-0.45 MEQ/L-%-% IV SOLN
INTRAVENOUS | Status: DC
  Administered 2017-02-20: 1000 mL via INTRAVENOUS
  Administered 2017-02-21: 01:00:00 via INTRAVENOUS
  Filled 2017-02-20: qty 1000

## 2017-02-20 MED ORDER — FENTANYL CITRATE (PF) 100 MCG/2ML IJ SOLN
INTRAMUSCULAR | Status: AC
  Filled 2017-02-20: qty 2

## 2017-02-20 MED ORDER — LACTATED RINGERS IV SOLN
INTRAVENOUS | Status: DC
  Administered 2017-02-20 (×3): via INTRAVENOUS

## 2017-02-20 MED ORDER — OXYCODONE-ACETAMINOPHEN 5-325 MG PO TABS: 1.0000 | ORAL_TABLET | ORAL | 0 refills | Status: DC | PRN

## 2017-02-20 MED ORDER — ONDANSETRON HCL 4 MG PO TABS: 4.0000 mg | ORAL_TABLET | Freq: Four times a day (QID) | ORAL | Status: DC | PRN

## 2017-02-20 MED ORDER — PNEUMOCOCCAL VAC POLYVALENT 25 MCG/0.5ML IJ INJ
0.5000 mL | INJECTION | INTRAMUSCULAR | Status: AC
  Administered 2017-02-21: 0.5 mL via INTRAMUSCULAR
  Filled 2017-02-20: qty 0.5

## 2017-02-20 MED ORDER — FENTANYL CITRATE (PF) 100 MCG/2ML IJ SOLN
50.0000 ug | Freq: Once | INTRAMUSCULAR | Status: AC
  Administered 2017-02-20: 50 ug via INTRAVENOUS

## 2017-02-20 MED ORDER — SENNOSIDES-DOCUSATE SODIUM 8.6-50 MG PO TABS: 1.0000 | ORAL_TABLET | Freq: Every evening | ORAL | Status: DC | PRN

## 2017-02-20 MED ORDER — SODIUM CHLORIDE 0.9 % IR SOLN
Status: DC | PRN
  Administered 2017-02-20: 1000 mL

## 2017-02-20 MED ORDER — CELECOXIB 200 MG PO CAPS
200.0000 mg | ORAL_CAPSULE | Freq: Two times a day (BID) | ORAL | Status: DC
  Administered 2017-02-20 – 2017-02-22 (×4): 200 mg via ORAL
  Filled 2017-02-20 (×4): qty 1

## 2017-02-20 MED ORDER — METOCLOPRAMIDE HCL 5 MG PO TABS: 5.0000 mg | ORAL_TABLET | Freq: Three times a day (TID) | ORAL | Status: DC | PRN

## 2017-02-20 MED ORDER — ALBUTEROL SULFATE (2.5 MG/3ML) 0.083% IN NEBU: 3.0000 mL | INHALATION_SOLUTION | Freq: Four times a day (QID) | RESPIRATORY_TRACT | Status: DC | PRN

## 2017-02-20 MED ORDER — BUPIVACAINE IN DEXTROSE 0.75-8.25 % IT SOLN
INTRATHECAL | Status: DC | PRN
Start: 2017-02-20 — End: 2017-02-20
  Administered 2017-02-20: 2 mL via INTRATHECAL

## 2017-02-20 MED ORDER — PHENOL 1.4 % MT LIQD: 1.0000 | OROMUCOSAL | Status: DC | PRN

## 2017-02-20 MED ORDER — IRBESARTAN 150 MG PO TABS
75.0000 mg | ORAL_TABLET | Freq: Every day | ORAL | Status: DC
  Administered 2017-02-21 – 2017-02-22 (×2): 75 mg via ORAL
  Filled 2017-02-20 (×2): qty 1

## 2017-02-20 MED ORDER — ONDANSETRON HCL 4 MG/2ML IJ SOLN
4.0000 mg | Freq: Four times a day (QID) | INTRAMUSCULAR | Status: DC | PRN
  Administered 2017-02-20 – 2017-02-21 (×2): 4 mg via INTRAVENOUS
  Filled 2017-02-20 (×2): qty 2

## 2017-02-20 MED ORDER — ALPRAZOLAM 0.5 MG PO TABS
1.0000 mg | ORAL_TABLET | Freq: Three times a day (TID) | ORAL | Status: DC | PRN
  Administered 2017-02-20 – 2017-02-21 (×2): 1 mg via ORAL
  Filled 2017-02-20 (×2): qty 2

## 2017-02-20 MED ORDER — DOCUSATE SODIUM 100 MG PO CAPS
100.0000 mg | ORAL_CAPSULE | Freq: Two times a day (BID) | ORAL | Status: DC
  Administered 2017-02-20 – 2017-02-22 (×4): 100 mg via ORAL
  Filled 2017-02-20 (×4): qty 1

## 2017-02-20 MED ORDER — BUPIVACAINE HCL (PF) 0.25 % IJ SOLN
INTRAMUSCULAR | Status: DC | PRN
  Administered 2017-02-20: 60 mL

## 2017-02-20 MED ORDER — OXYCODONE HCL 5 MG PO TABS
5.0000 mg | ORAL_TABLET | ORAL | Status: DC | PRN
  Administered 2017-02-20 – 2017-02-22 (×11): 10 mg via ORAL
  Filled 2017-02-20 (×11): qty 2

## 2017-02-20 MED ORDER — GABAPENTIN 300 MG PO CAPS
300.0000 mg | ORAL_CAPSULE | Freq: Three times a day (TID) | ORAL | Status: DC
  Administered 2017-02-20 – 2017-02-22 (×7): 300 mg via ORAL
  Filled 2017-02-20 (×7): qty 1

## 2017-02-20 MED ORDER — ALBUTEROL SULFATE 1.25 MG/3ML IN NEBU: 1.2500 mg | INHALATION_SOLUTION | Freq: Three times a day (TID) | RESPIRATORY_TRACT | Status: DC | PRN

## 2017-02-20 MED ORDER — MIDAZOLAM HCL 2 MG/2ML IJ SOLN
INTRAMUSCULAR | Status: AC
  Administered 2017-02-20: 2 mg via INTRAVENOUS
  Filled 2017-02-20: qty 2

## 2017-02-20 MED ORDER — METOCLOPRAMIDE HCL 5 MG/ML IJ SOLN
5.0000 mg | Freq: Three times a day (TID) | INTRAMUSCULAR | Status: DC | PRN
  Administered 2017-02-20: 10 mg via INTRAVENOUS
  Filled 2017-02-20: qty 2

## 2017-02-20 MED ORDER — METHOCARBAMOL 1000 MG/10ML IJ SOLN
500.0000 mg | Freq: Four times a day (QID) | INTRAMUSCULAR | Status: DC | PRN
  Filled 2017-02-20: qty 5

## 2017-02-20 MED ORDER — MIDAZOLAM HCL 2 MG/2ML IJ SOLN
2.0000 mg | Freq: Once | INTRAMUSCULAR | Status: AC
  Administered 2017-02-20: 2 mg via INTRAVENOUS

## 2017-02-20 MED ORDER — METHOCARBAMOL 500 MG PO TABS: 500.0000 mg | ORAL_TABLET | Freq: Two times a day (BID) | ORAL | 0 refills | Status: DC

## 2017-02-20 MED ORDER — DIPHENHYDRAMINE HCL 12.5 MG/5ML PO ELIX: 12.5000 mg | ORAL_SOLUTION | ORAL | Status: DC | PRN

## 2017-02-20 MED ORDER — KCL IN DEXTROSE-NACL 20-5-0.45 MEQ/L-%-% IV SOLN
INTRAVENOUS | Status: AC
  Filled 2017-02-20: qty 1000

## 2017-02-20 MED ORDER — HYDROMORPHONE HCL 1 MG/ML IJ SOLN
0.2500 mg | INTRAMUSCULAR | Status: DC | PRN
  Administered 2017-02-20 (×3): 0.5 mg via INTRAVENOUS

## 2017-02-20 MED ORDER — HYDROMORPHONE HCL 1 MG/ML IJ SOLN
INTRAMUSCULAR | Status: AC
  Filled 2017-02-20: qty 0.5

## 2017-02-20 MED ORDER — SODIUM CHLORIDE 0.9 % IJ SOLN
INTRAMUSCULAR | Status: DC | PRN
  Administered 2017-02-20: 50 mL via INTRAVENOUS

## 2017-02-20 MED ORDER — MIDAZOLAM HCL 2 MG/2ML IJ SOLN
INTRAMUSCULAR | Status: DC | PRN
  Administered 2017-02-20: 2 mg via INTRAVENOUS

## 2017-02-20 MED ORDER — PROPOFOL 500 MG/50ML IV EMUL
INTRAVENOUS | Status: DC | PRN
  Administered 2017-02-20: 75 ug/kg/min via INTRAVENOUS

## 2017-02-20 MED ORDER — BISACODYL 5 MG PO TBEC: 5.0000 mg | DELAYED_RELEASE_TABLET | Freq: Every day | ORAL | Status: DC | PRN

## 2017-02-20 MED ORDER — MIDAZOLAM HCL 2 MG/2ML IJ SOLN
INTRAMUSCULAR | Status: AC
  Filled 2017-02-20: qty 2

## 2017-02-20 MED ORDER — FLEET ENEMA 7-19 GM/118ML RE ENEM: 1.0000 | ENEMA | Freq: Once | RECTAL | Status: DC | PRN

## 2017-02-20 MED ORDER — CHLORHEXIDINE GLUCONATE 4 % EX LIQD: 60.0000 mL | Freq: Once | CUTANEOUS | Status: DC

## 2017-02-20 MED ORDER — BUPIVACAINE HCL (PF) 0.25 % IJ SOLN
INTRAMUSCULAR | Status: AC
  Filled 2017-02-20: qty 30

## 2017-02-20 MED ORDER — FENTANYL CITRATE (PF) 100 MCG/2ML IJ SOLN
INTRAMUSCULAR | Status: AC
  Administered 2017-02-20: 50 ug via INTRAVENOUS
  Filled 2017-02-20: qty 2

## 2017-02-20 MED ORDER — HYDROMORPHONE HCL 2 MG/ML IJ SOLN
1.0000 mg | INTRAMUSCULAR | Status: DC | PRN
  Administered 2017-02-20 – 2017-02-21 (×2): 1 mg via INTRAVENOUS
  Filled 2017-02-20 (×2): qty 1

## 2017-02-20 MED ORDER — ACETAMINOPHEN 650 MG RE SUPP: 650.0000 mg | Freq: Four times a day (QID) | RECTAL | Status: DC | PRN

## 2017-02-20 SURGICAL SUPPLY — 65 items
BANDAGE ACE 4X5 VEL STRL LF (GAUZE/BANDAGES/DRESSINGS) ×2 IMPLANT
BANDAGE ACE 6X5 VEL STRL LF (GAUZE/BANDAGES/DRESSINGS) ×2 IMPLANT
BANDAGE ESMARK 6X9 LF (GAUZE/BANDAGES/DRESSINGS) ×1 IMPLANT
BIT DRILL 11X150 CANN QR (BIT) ×6 IMPLANT
BLADE CLIPPER SURG (BLADE) IMPLANT
BLADE SAG 18X100X1.27 (BLADE) ×2 IMPLANT
BLADE SAW SAG 90X13X1.27 (BLADE) ×2 IMPLANT
BNDG ESMARK 6X9 LF (GAUZE/BANDAGES/DRESSINGS) ×2
BOWL SMART MIX CTS (DISPOSABLE) ×2 IMPLANT
CAPT KNEE TOTAL 3 ATTUNE ×2 IMPLANT
CEMENT HV SMART SET (Cement) ×4 IMPLANT
COVER BACK TABLE 24X17X13 BIG (DRAPES) IMPLANT
COVER SURGICAL LIGHT HANDLE (MISCELLANEOUS) ×2 IMPLANT
CUFF TOURNIQUET SINGLE 34IN LL (TOURNIQUET CUFF) ×2 IMPLANT
CUFF TOURNIQUET SINGLE 44IN (TOURNIQUET CUFF) IMPLANT
DISC DIAMOND MED (BURR) IMPLANT
DRAPE HALF SHEET 40X57 (DRAPES) IMPLANT
DRAPE IMP U-DRAPE 54X76 (DRAPES) IMPLANT
DRAPE U-SHAPE 47X51 STRL (DRAPES) ×2 IMPLANT
DURAPREP 26ML APPLICATOR (WOUND CARE) ×2 IMPLANT
ELECT REM PT RETURN 9FT ADLT (ELECTROSURGICAL) ×2
ELECTRODE REM PT RTRN 9FT ADLT (ELECTROSURGICAL) ×1 IMPLANT
EVACUATOR 1/8 PVC DRAIN (DRAIN) IMPLANT
GAUZE SPONGE 4X4 12PLY STRL (GAUZE/BANDAGES/DRESSINGS) IMPLANT
GAUZE XEROFORM 1X8 LF (GAUZE/BANDAGES/DRESSINGS) IMPLANT
GLOVE BIO SURGEON STRL SZ7.5 (GLOVE) ×2 IMPLANT
GLOVE BIO SURGEON STRL SZ8.5 (GLOVE) ×4 IMPLANT
GLOVE BIOGEL PI IND STRL 8 (GLOVE) ×2 IMPLANT
GLOVE BIOGEL PI IND STRL 9 (GLOVE) ×1 IMPLANT
GLOVE BIOGEL PI INDICATOR 8 (GLOVE) ×2
GLOVE BIOGEL PI INDICATOR 9 (GLOVE) ×1
GOWN STRL REUS W/ TWL LRG LVL3 (GOWN DISPOSABLE) ×1 IMPLANT
GOWN STRL REUS W/ TWL XL LVL3 (GOWN DISPOSABLE) ×2 IMPLANT
GOWN STRL REUS W/TWL LRG LVL3 (GOWN DISPOSABLE) ×1
GOWN STRL REUS W/TWL XL LVL3 (GOWN DISPOSABLE) ×2
HANDPIECE INTERPULSE COAX TIP (DISPOSABLE) ×1
HOOD PEEL AWAY FACE SHEILD DIS (HOOD) ×4 IMPLANT
KIT BASIN OR (CUSTOM PROCEDURE TRAY) ×2 IMPLANT
KIT ROOM TURNOVER OR (KITS) ×2 IMPLANT
MANIFOLD NEPTUNE II (INSTRUMENTS) ×2 IMPLANT
NEEDLE SPNL 18GX3.5 QUINCKE PK (NEEDLE) ×2 IMPLANT
NS IRRIG 1000ML POUR BTL (IV SOLUTION) ×2 IMPLANT
PACK TOTAL JOINT (CUSTOM PROCEDURE TRAY) ×2 IMPLANT
PACK UNIVERSAL I (CUSTOM PROCEDURE TRAY) ×2 IMPLANT
PAD ARMBOARD 7.5X6 YLW CONV (MISCELLANEOUS) ×4 IMPLANT
PAD CAST 4YDX4 CTTN HI CHSV (CAST SUPPLIES) ×1 IMPLANT
PADDING CAST COTTON 4X4 STRL (CAST SUPPLIES) ×1
PADDING CAST COTTON 6X4 STRL (CAST SUPPLIES) ×2 IMPLANT
RASP HELIOCORDIAL MED (MISCELLANEOUS) IMPLANT
SET HNDPC FAN SPRY TIP SCT (DISPOSABLE) ×1 IMPLANT
STAPLER VISISTAT 35W (STAPLE) ×2 IMPLANT
SUCTION FRAZIER HANDLE 10FR (MISCELLANEOUS) ×1
SUCTION TUBE FRAZIER 10FR DISP (MISCELLANEOUS) ×1 IMPLANT
SUT VIC AB 0 CT1 27 (SUTURE) ×1
SUT VIC AB 0 CT1 27XBRD ANBCTR (SUTURE) ×1 IMPLANT
SUT VIC AB 1 CTX 36 (SUTURE) ×1
SUT VIC AB 1 CTX36XBRD ANBCTR (SUTURE) ×1 IMPLANT
SUT VIC AB 2-0 CT1 27 (SUTURE) ×1
SUT VIC AB 2-0 CT1 TAPERPNT 27 (SUTURE) ×1 IMPLANT
SYR 50ML LL SCALE MARK (SYRINGE) ×2 IMPLANT
TOWEL OR 17X24 6PK STRL BLUE (TOWEL DISPOSABLE) IMPLANT
TOWEL OR 17X26 10 PK STRL BLUE (TOWEL DISPOSABLE) IMPLANT
TRAY FOLEY CATH SILVER 14FR (SET/KITS/TRAYS/PACK) ×2 IMPLANT
TUBE ANAEROBIC SPECIMEN COL (MISCELLANEOUS) IMPLANT
WATER STERILE IRR 1000ML POUR (IV SOLUTION) IMPLANT

## 2017-02-20 NOTE — Anesthesia Procedure Notes (Signed)
Anesthesia Regional Block: Adductor canal block   Pre-Anesthetic Checklist: ,, timeout performed, Correct Patient, Correct Site, Correct Laterality, Correct Procedure, Correct Position, site marked, Risks and benefits discussed,  Surgical consent,  Pre-op evaluation,  At surgeon's request and post-op pain management  Laterality: Right  Prep: chloraprep       Needles:  Injection technique: Single-shot  Needle Type: Stimiplex     Needle Length: 9cm  Needle Gauge: 21     Additional Needles:   Procedures: ultrasound guided,,,,,,,,  Narrative:  Start time: 02/20/2017 10:55 AM End time: 02/20/2017 10:58 AM Injection made incrementally with aspirations every 5 mL.  Performed by: Personally  Anesthesiologist: Nolon Nations  Additional Notes: BP cuff, EKG monitors applied. Sedation begun. Artery and nerve location verified with U/S and anesthetic injected incrementally, slowly, and after negative aspirations under direct u/s guidance. Good fascial /perineural spread. Tolerated well.

## 2017-02-20 NOTE — Op Note (Signed)
PATIENT ID:      Wendy Edwards  MRN:     [id number] DOB/AGE:    09-12-1966 / 51 y.o.       OPERATIVE REPORT    DATE OF PROCEDURE:  02/20/2017       PREOPERATIVE DIAGNOSIS:   PAINFUL RIGHT PATELLAFEMORAL REPLACEMENT      Estimated body mass index is 28.5 kg/m as calculated from the following:   Height as of 02/15/17: 5\' 3"  (1.6 m).   Weight as of 02/15/17: 160 lb 14.4 oz (73 kg).                                                        POSTOPERATIVE DIAGNOSIS:   PAINFUL RIGHT KNEE prostheses                    PROCEDURE:  Procedure(s): TOTAL KNEE REVISION Using DepuyAttune RP implants #5 right Femur, #5 Tibia, 5 mm Attune RP bearing, 35 Patella     SURGEON: Latarshia Jersey J    ASSISTANT:   [employer]   (Present and scrubbed throughout the case, critical for assistance with exposure, retraction, instrumentation, and closure.)         ANESTHESIA: Spinal, 20cc Exparel, 50cc 0.25% Marcaine  EBL: 300 mL  FLUID REPLACEMENT: 1500 mL crystalloid  TOURNIQUET TIME: 70min  Drains: None  Tranexamic Acid: 1gm IV, 2gm topical  COMPLICATIONS:  None         INDICATIONS FOR PROCEDURE: The patient has  PAINFUL RIGHT PATELLAFEMORAL REPLACEMENT. Patient had right patellofemoral knee replacement in 2006. Has been on pain management for many years and most recently has had increasing pain in the right knee prostheses. Bone scan shows increased uptake sunrise x-ray show loss of height of the patellar component with possible wear or loosening. The increased uptake includes the femoral component as well. After living with discomfort for many years that wakes her at night interferes with activities and is causing buckling episode she desires revision right total knee replacement. Risks and benefits of surgery have been discussed, questions answered.   DESCRIPTION OF PROCEDURE: The patient identified by armband, received  IV antibiotics, in the holding area at [facility]. Patient taken to the  operating room, appropriate anesthetic  monitors were attached, and spinal anesthesia was  induced. Tourniquet  applied high to the operative thigh. Lateral post and foot positioner  applied to the table, the lower extremity was then prepped and draped  in usual sterile fashion from the toes to the tourniquet. Time-out procedure was performed. We began the operation, with the knee flexed 120 degrees, by making the medial parapatellar incision starting at handbreadth above the patella going over the patella 1 cm medial to and 4 cm distal to the tibial tubercle. Small bleeders in the skin and the  subcutaneous tissue identified and cauterized. Transverse retinaculum was incised and reflected medially and a medial parapatellar arthrotomy was accomplished. the patella was everted and theprepatellar fat pad and scar tissue were resected. The superficial medial collateral  ligament was then elevated from anterior to posterior along the proximal  flare of the tibia and anterior half of the menisci resected. The knee was hyperflexed exposing bone on bone arthritis. Peripheral and notch osteophytes as well as the cruciate ligaments were then resected. We continued  to work our way around posteriorly along the proximal tibia, and externally  rotated the tibia subluxing it out from underneath the femur. A McHale  retractor was placed through the notch and a lateral Hohmann retractor  placed, and we then drilled through the proximal tibia in line with the  axis of the tibia followed by an intramedullary guide rod and 2-degree  posterior slope cutting guide. The tibial cutting guide, 3 degree posterior sloped, was pinned into place allowing resection of 7 mm of bone medially and 7 mm of bone laterally. We then worked our way around the femoral portion of the primary knee replacement with a small osteotome and anterior cruciate ligament saw gradually worked the femoral plate off of the femur removing very little bone.  In a similar fashion the anterior cruciate ligament saw was used to separate the patellar button which did in fact have fracture of the plastic with where off of the patella and once it was removed we finish the cut removing a millimeter of bone from the backside of the patella and finally he is a small drill to remove the polyethylene pegs from the prior patellar replacement. Patellar bone stock overall was good. We then  entered the distal femur 2 mm anterior to the PCL origin with the  intramedullary guide rod and applied the distal femoral cutting guide  set at 9 mm, with 5 degrees of valgus. This was pinned along the  epicondylar axis. At this point, the distal femoral cut was accomplished without difficulty. We then sized for a #5R femoral component and pinned the guide in 0 degrees of external rotation. The chamfer cutting guide was pinned into place. The anterior, posterior, and chamfer cuts were accomplished without difficulty followed by  the Attune RP box cutting guide and the box cut. We also removed posterior osteophytes from the posterior femoral condyles. At this  time, the knee was brought into full extension. We checked our  extension and flexion gaps and found them symmetric for a 5 mm bearing. Distracting in extension with a lamina spreader, the posterior horns of the menisci were removed, and Exparel, diluted to 60 cc, with 20cc NS, and 20cc 0.5% Marcaine,was injected into the capsule and synovium of the knee. The posterior patella cut was accomplished with the 9.5 mm Attune cutting guide, sized for a 33mm dome, and the fixation pegs drilled.The knee  was then once again hyperflexed exposing the proximal tibia. We sized for a # 5 tibial base plate, applied the smokestack and the conical reamer followed by the the Delta fin keel punch. We then hammered into place the Attune RP trial femoral component, drilled the lugs, inserted a  5 mm trial bearing, trial patellar button, and took the  knee through range of motion from 0-130 degrees. No thumb pressure was required for patellar Tracking. At this point, the limb was wrapped with an Esmarch bandage and the tourniquet inflated to 350 mmHg. All trial components were removed, mating surfaces irrigated with pulse lavage, and dried with suction and sponges. 10 cc of the Exparel solution was applied to the cancellus bone of the patella distal femur and proximal tibia.  After waiting 1 minute, the bony surfaces were again, dried with sponges. A double batch of DePuy HV cement with 1500 mg of Zinacef was mixed and applied to all bony metallic mating surfaces except for the posterior condyles of the femur itself. In order, we hammered into place the tibial tray and removed excess cement,  the femoral component and removed excess cement. The final Attune RP bearing  was inserted, and the knee brought to full extension with compression.  The patellar button was clamped into place, and excess cement  removed. While the cement cured the wound was irrigated out with normal saline solution pulse lavage. Ligament stability and patellar tracking were checked and found to be excellent. The parapatellar arthrotomy was closed with  running #1 Vicryl suture. The subcutaneous tissue with 0 and 2-0 undyed  Vicryl suture, and the skin with running 3-0 SQ vicryl. A dressing of Xeroform,  4 x 4, dressing sponges, Webril, and Ace wrap applied. The patient  awakened, and taken to recovery room without difficulty.   Frederik Pear J 02/20/2017, 2:03 PM

## 2017-02-20 NOTE — Anesthesia Postprocedure Evaluation (Signed)
Anesthesia Post Note  Patient: Wendy Edwards  Procedure(s) Performed: Procedure(s) (LRB): TOTAL KNEE REVISION (Right)  Patient location during evaluation: PACU Anesthesia Type: Regional and Spinal Level of consciousness: awake and alert Pain management: pain level controlled Vital Signs Assessment: post-procedure vital signs reviewed and stable Respiratory status: spontaneous breathing and respiratory function stable Cardiovascular status: blood pressure returned to baseline and stable Postop Assessment: spinal receding Anesthetic complications: no       Last Vitals:  Vitals:   02/20/17 1654 02/20/17 1700  BP: 129/84 137/64  Pulse: (!) 113 (!) 108  Resp: 16 18  Temp: 36.6 C 36.6 C    Last Pain:  Vitals:   02/20/17 1700  TempSrc: Oral  PainSc:                  [name],W. EDMOND

## 2017-02-20 NOTE — Discharge Instructions (Signed)

## 2017-02-20 NOTE — Anesthesia Procedure Notes (Signed)
Spinal  Patient location during procedure: OR Staffing Anesthesiologist: Nolon Nations Performed: anesthesiologist  Preanesthetic Checklist Completed: patient identified, site marked, surgical consent, pre-op evaluation, timeout performed, IV checked, risks and benefits discussed and monitors and equipment checked Spinal Block Patient position: sitting Prep: ChloraPrep Patient monitoring: heart rate, continuous pulse ox and blood pressure Approach: left paramedian Location: L3-4 Injection technique: single-shot Needle Needle type: Sprotte  Needle gauge: 24 G Needle length: 9 cm Assessment Sensory level: T10 Additional Notes Expiration date of kit checked and confirmed. Patient tolerated procedure well, without complications.

## 2017-02-20 NOTE — Progress Notes (Signed)
Orthopedic Tech Progress Note Patient Details:  AMINDA TUCEK 1966-08-09 [id number]  Ortho Devices Type of Ortho Device: Other (comment) Ortho Device/Splint Location: Provided bone foam to pt for Right Leg/knee.  Supply Drop off.  Instructed pt on how to use once pt reaches home.  Bone foam Zero degree (Footsie roll)   Kristopher Oppenheim 02/20/2017, 4:18 PM

## 2017-02-20 NOTE — Transfer of Care (Signed)
Immediate Anesthesia Transfer of Care Note  Patient: Wendy Edwards  Procedure(s) Performed: Procedure(s): TOTAL KNEE REVISION (Right)  Patient Location: PACU  Anesthesia Type:MAC and Spinal  Level of Consciousness: awake, alert , oriented and patient cooperative  Airway & Oxygen Therapy: Patient Spontanous Breathing  Post-op Assessment: Report given to RN and Post -op Vital signs reviewed and stable  Post vital signs: Reviewed and stable  Last Vitals:  Vitals:   02/20/17 1105 02/20/17 1421  BP: (!) 117/57 119/69  Pulse: (!) 115 (!) 101  Resp: 19 (!) 22  Temp:  36.8 C    Last Pain:  Vitals:   02/20/17 0840  TempSrc: Oral         Complications: No apparent anesthesia complications   Saturation 96% on room air. Denies SOB, Chest pain, SOB.

## 2017-02-20 NOTE — Anesthesia Procedure Notes (Signed)
Procedure Name: MAC Date/Time: 02/20/2017 12:10 PM Performed by: Oletta Lamas Pre-anesthesia Checklist: Patient identified, Emergency Drugs available, Suction available and Patient being monitored Patient Re-evaluated:Patient Re-evaluated prior to inductionOxygen Delivery Method: Simple face mask

## 2017-02-20 NOTE — Anesthesia Preprocedure Evaluation (Signed)
Anesthesia Evaluation  Patient identified by MRN, date of birth, ID band Patient awake    Reviewed: Allergy & Precautions, NPO status , Patient's Chart, lab work & pertinent test results  Airway Mallampati: II  TM Distance: >3 FB Neck ROM: Full    Dental no notable dental hx.    Pulmonary asthma , Current Smoker,    Pulmonary exam normal breath sounds clear to auscultation       Cardiovascular hypertension, negative cardio ROS Normal cardiovascular exam Rhythm:Regular Rate:Normal     Neuro/Psych PSYCHIATRIC DISORDERS Anxiety negative neurological ROS     GI/Hepatic Neg liver ROS, GERD  ,  Endo/Other  negative endocrine ROS  Renal/GU negative Renal ROS     Musculoskeletal  (+) Arthritis ,   Abdominal   Peds  Hematology  (+) Blood dyscrasia, anemia ,   Anesthesia Other Findings   Reproductive/Obstetrics negative OB ROS                             Anesthesia Physical Anesthesia Plan  ASA: II  Anesthesia Plan: Regional and Spinal   Post-op Pain Management:    Induction:   Airway Management Planned:   Additional Equipment:   Intra-op Plan:   Post-operative Plan:   Informed Consent: I have reviewed the patients History and Physical, chart, labs and discussed the procedure including the risks, benefits and alternatives for the proposed anesthesia with the patient or authorized representative who has indicated his/her understanding and acceptance.   Dental advisory given  Plan Discussed with: CRNA  Anesthesia Plan Comments:         Anesthesia Quick Evaluation

## 2017-02-21 ENCOUNTER — Encounter (HOSPITAL_COMMUNITY): Payer: Self-pay | Admitting: Orthopedic Surgery

## 2017-02-21 LAB — CBC
HCT: 31.5 % — ABNORMAL LOW (ref 36.0–46.0)
Hemoglobin: 10.4 g/dL — ABNORMAL LOW (ref 12.0–15.0)
MCH: 32.4 pg (ref 26.0–34.0)
MCHC: 33 g/dL (ref 30.0–36.0)
MCV: 98.1 fL (ref 78.0–100.0)
Platelets: 138 10*3/uL — ABNORMAL LOW (ref 150–400)
RBC: 3.21 MIL/uL — ABNORMAL LOW (ref 3.87–5.11)
RDW: 13.5 % (ref 11.5–15.5)
WBC: 7 10*3/uL (ref 4.0–10.5)

## 2017-02-21 LAB — BASIC METABOLIC PANEL
Anion gap: 7 (ref 5–15)
BUN: 6 mg/dL (ref 6–20)
CALCIUM: 8.8 mg/dL — AB (ref 8.9–10.3)
CO2: 27 mmol/L (ref 22–32)
CREATININE: 0.6 mg/dL (ref 0.44–1.00)
Chloride: 103 mmol/L (ref 101–111)
GFR calc non Af Amer: >60 mL/min (ref >60)
Glucose, Bld: 131 mg/dL — ABNORMAL HIGH (ref 65–99)
Potassium: 3.7 mmol/L (ref 3.5–5.1)
SODIUM: 137 mmol/L (ref 135–145)

## 2017-02-21 MED ORDER — ENSURE ENLIVE PO LIQD
237.0000 mL | Freq: Two times a day (BID) | ORAL | Status: DC
  Administered 2017-02-21: 237 mL via ORAL

## 2017-02-21 NOTE — Progress Notes (Signed)
PATIENT ID: Wendy Edwards  MRN: [id number]  DOB/AGE:  1966-11-05 / 51 y.o.  1 Day Post-Op Procedure(s) (LRB): TOTAL KNEE REVISION (Right)    PROGRESS NOTE Subjective: Patient is alert, oriented, x2 Nausea, no Vomiting, yes passing gas. Taking PO well today. Denies SOB, Chest or Calf Pain. Using Incentive Spirometer, PAS in place. Ambulate WBAT, has been to BR, Patient reports pain as 3/10 .    Objective: Vital signs in last 24 hours: Vitals:   02/20/17 1700 02/20/17 2037 02/21/17 0139 02/21/17 0430  BP: 137/64 124/71 122/68 126/74  Pulse: (!) 108 (!) 115 91 88  Resp: 18 18 18 18   Temp: 97.9 F (36.6 C) 98.7 F (37.1 C) 98.7 F (37.1 C) 98.7 F (37.1 C)  TempSrc: Oral Oral Oral Oral  SpO2: 97% 99% 99% 99%      Intake/Output from previous day: I/O last 3 completed shifts: In: 1886.4 [P.O.:250; I.V.:1636.4] Out: 575 [Urine:100; Blood:475]   Intake/Output this shift: No intake/output data recorded.   LABORATORY DATA:  Recent Labs  02/21/17 0623  WBC 7.0  HGB 10.4*  HCT 31.5*  PLT 138*    Examination: Neurologically intact ABD soft Neurovascular intact Sensation intact distally Intact pulses distally Dorsiflexion/Plantar flexion intact Incision: moderate drainage No cellulitis present Compartment soft} New Aquacel applied, ROM 10-95  Assessment:   1 Day Post-Op Procedure(s) (LRB): TOTAL KNEE REVISION (Right) ADDITIONAL DIAGNOSIS: Expected Acute Blood Loss Anemia, Hypertension  Plan: PT/OT WBAT, AROM and PROM  DVT Prophylaxis:  SCDx72hrs, ASA 325 mg BID x 2 weeks DISCHARGE PLAN: Home, if does well with PT, lives in [place], no steps DISCHARGE NEEDS: HHPT, Walker and 3-in-1 comode seat     Wendy Edwards J 02/21/2017, 7:14 AM

## 2017-02-21 NOTE — Progress Notes (Signed)
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 PMH includes HTN, aplastic anemia, and Asthma, .     PT Comments    Pt min guard for sit<>stand transfers from recliner and toilet. Pt able to use bathroom and wash hands with min Ax1 and RW Pt ambulated 125 feet total with min A x1 using RW with no LoB, no buckling. Pt requires continued skilled PT to continue transfer and gait training as well as improve LE strength and ROM to be able to safely navigate her home environment.    Follow Up Recommendations  Home health PT     Equipment Recommendations  3in1 (PT)    Recommendations for Other Services       Precautions / Restrictions Precautions Precautions: Knee Precaution Booklet Issued: Yes (comment) (precautions with exercises) Restrictions Weight Bearing Restrictions: Yes RLE Weight Bearing: Weight bearing as tolerated    Mobility  Bed Mobility               General bed mobility comments: in recliner on entry  Transfers Overall transfer level: Needs assistance Equipment used: Rolling walker (2 wheeled) (inital vc for push off from bed surface ) Transfers: Sit to/from Stand Sit to Stand: Min guard         General transfer comment:  vc for push off from recliner to come to the RW   Ambulation/Gait Ambulation/Gait assistance: Min assist Ambulation Distance (Feet): 125 Feet (1 x 15 feet to bathroom, 1 x120 feet in hallway) Assistive device: Rolling walker (2 wheeled) Gait Pattern/deviations: Decreased stance time - right;Decreased step length - right;Decreased step length - left;Decreased weight shift to right;Antalgic;Step-through pattern Gait velocity: slow Gait velocity interpretation: Below normal speed for age/gender General Gait Details: slow  steady step to pattern with decreased weight bearing through R LE, no knee buckling or LoB, vc for placing entire R foot on the floor and bearing weight through UE instead of only walking on toe education on need to try to straighten knee as much as possible   Stairs            Wheelchair Mobility    Modified Rankin (Stroke Patients Only)       Balance Overall balance assessment: Needs assistance Sitting-balance support: Feet supported;No upper extremity supported Sitting balance-Leahy Scale: Fair Sitting balance - Comments: did not need UE support for balance   Standing balance support: Bilateral upper extremity supported;Single extremity supported (able to wash hands without support after using bathroom) Standing balance-Leahy Scale: Fair                      Cognition Arousal/Alertness: Awake/alert Behavior During Therapy: WFL for tasks assessed/performed Overall Cognitive Status: Within Functional Limits for tasks assessed                      Exercises General Exercises - Lower Extremity Ankle Circles/Pumps: 10 reps;AROM;Both;Seated Long Arc Quad: AROM;Right;10 reps;Seated Hip ABduction/ADduction: AROM;10 reps;Right;Seated Hip Flexion/Marching: AROM;Right;10 reps;Seated    General Comments General comments (skin integrity, edema, etc.): 79 degrees R knee flexion measured seated in recliner leg -17 degrees knee extension measured supine in recliner       Pertinent Vitals/Pain Pain Assessment: 0-10 Pain Score: 8  Pain Location: R knee Pain Descriptors / Indicators:  Tightness;Sore;Dull Pain Intervention(s): Monitored during session;Patient requesting pain meds-RN notified  At rest SaO2 on RA 99%, 115 bpm With exercise SaO2 on RA dropped to 85% and quickly returned to 93% with vc for pursed lipped breathing HR 125 bpm,             PT Goals (current goals can now be found in the care plan section) Acute Rehab PT Goals Patient Stated Goal: go  home soon PT Goal Formulation: With patient Time For Goal Achievement: 02/28/17 Potential to Achieve Goals: Good Progress towards PT goals: Progressing toward goals    Frequency    7X/week      PT Plan Current plan remains appropriate       End of Session Equipment Utilized During Treatment: Gait belt Activity Tolerance: Patient limited by pain Patient left: in chair;with call bell/phone within reach (pt R foot placed in bone foam for optimal knee extension) Nurse Communication: Mobility status;Patient requests pain meds PT Visit Diagnosis: Other abnormalities of gait and mobility (R26.89);Pain Pain - Right/Left: Right Pain - part of body: Knee     Time: [id number] PT Time Calculation (min) (ACUTE ONLY): 36 min  Charges:  $Gait Training: 8-22 mins $Therapeutic Exercise: 8-22 mins                    G Codes:       Bailey Mech Fleet 02/21/2017, 2:27 PM  Dani Gobble. Migdalia Dk PT, DPT Acute Rehabilitation  [phone] Pager [phone]

## 2017-02-21 NOTE — Evaluation (Signed)
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 PMH includes HTN, aplastic anemia, and Asthma, .   Clinical Impression  Pt is s/p TKA revision  resulting in the deficits listed below (see PT Problem List). Pt mod I for bed mobility, min guard for transfers and minA x 1 with RW for ambulation with RW no LoB, no buckling. Pt will benefit from skilled PT to increase their independence and safety with mobility to allow discharge to the venue listed below.     Follow Up Recommendations Home health PT    Equipment Recommendations  3in1 (PT)    Recommendations for Other Services       Precautions / Restrictions Precautions Precautions: Knee Precaution Booklet Issued: No Restrictions Weight Bearing Restrictions: Yes RLE Weight Bearing: Weight bearing as tolerated      Mobility  Bed Mobility Overal bed mobility: Modified Independent             General bed mobility comments: with HoB elevated and use of handrails pt able to pull herself over to the EoB  Transfers Overall transfer level: Needs assistance Equipment used: Rolling walker (2 wheeled) (inital vc for push off from bed surface ) Transfers: Sit to/from Stand Sit to Stand: Min guard         General transfer comment: steady transfer to stand with decreased weight bearing through R LE to stand vc for keeping R LE out in front to lower down to the chair   Ambulation/Gait Ambulation/Gait assistance: Min assist Ambulation Distance (Feet): 40 Feet Assistive device: Rolling walker (2 wheeled) Gait Pattern/deviations: Step-to pattern;Decreased stance time - right;Decreased step length - right;Decreased step length - left;Decreased weight shift to right;Antalgic;Trunk flexed;Narrow base of support Gait  velocity: slow Gait velocity interpretation: Below normal speed for age/gender General Gait Details: slow steady step to pattern with decreased weight bearing through R LE, no knee buckling or LoB, vc for weight bearing through UE and increased R knee extension with weight bearing to increase heel strike     Balance Overall balance assessment: Needs assistance Sitting-balance support: Feet supported;Bilateral upper extremity supported Sitting balance-Leahy Scale: Fair Sitting balance - Comments: mainly relied on UE support but able to maintain balance without for 20 sec Postural control: Right lateral lean Standing balance support: Bilateral upper extremity supported Standing balance-Leahy Scale: Poor Standing balance comment: required UE support to maintain balance                              Pertinent Vitals/Pain Pain Assessment: 0-10 Pain Score: 8  Pain Location: R knee Pain Descriptors / Indicators: Tightness;Sore;Dull Pain Intervention(s): Premedicated before session  Pre activity HR 119 bpm, SaO2% on RA 97%, post activity HR 114 bpm, SaO2 on RA 93%    Home Living Family/patient expects to be discharged to:: Private residence Living Arrangements: Alone Available Help at Discharge: Other (Comment) (None) Type of Home: Apartment Home Access: Level entry     Home Layout: One level Home Equipment: Walker - 2 wheels;Shower seat;Grab bars - toilet;Grab bars - tub/shower      Prior Function Level of Independence: Independent;Independent with assistive device(s)         Comments: wore a brace when ambulating, limited ambulation  Extremity/Trunk Assessment   Upper Extremity Assessment Upper Extremity Assessment: Defer to OT evaluation    Lower Extremity Assessment Lower Extremity Assessment: RLE deficits/detail RLE Deficits / Details: decreased R hip flexion, knee flexion and extension ROM and strength RLE: Unable to fully assess due to pain     Cervical / Trunk Assessment Cervical / Trunk Assessment: Normal  Communication   Communication: No difficulties  Cognition Arousal/Alertness: Awake/alert Behavior During Therapy: WFL for tasks assessed/performed Overall Cognitive Status: Within Functional Limits for tasks assessed                      General Comments General comments (skin integrity, edema, etc.): Pt sleepy throughout session but able to fullly participate.    Exercises General Exercises - Lower Extremity Ankle Circles/Pumps: 15 reps;AROM;Both;Seated Quad Sets: AROM;10 reps;Both;Seated Gluteal Sets: AROM;10 reps;Both;Seated   Assessment/Plan    PT Assessment Patient needs continued PT services  PT Problem List Decreased mobility;Pain;Decreased strength;Decreased range of motion;Decreased activity tolerance       PT Treatment Interventions Gait training;Functional mobility training;Therapeutic activities;Therapeutic exercise;Balance training;DME instruction;Patient/family education    PT Goals (Current goals can be found in the Care Plan section)  Acute Rehab PT Goals Patient Stated Goal: go home soon PT Goal Formulation: With patient Potential to Achieve Goals: Good    Frequency 7X/week   Barriers to discharge Decreased caregiver support pt with no support at home possible friend to look in but nothing consistent       End of Session Equipment Utilized During Treatment: Gait belt Activity Tolerance: Patient limited by fatigue;Patient limited by pain Patient left: in chair;with call bell/phone within reach Nurse Communication: Mobility status PT Visit Diagnosis: Other abnormalities of gait and mobility (R26.89);Pain Pain - Right/Left: Right Pain - part of body: Knee         Time: [id number] PT Time Calculation (min) (ACUTE ONLY): 34 min   Charges:   PT Evaluation $PT Eval Low Complexity: 1 Procedure PT Treatments $Gait Training: 8-22 mins   PT G Codes:         Bailey Mech Fleet 02/21/2017, 9:30 AM  Dani Gobble. Migdalia Dk PT, DPT Acute Rehabilitation  [phone] Pager [phone]

## 2017-02-22 LAB — CBC
HCT: 28.2 % — ABNORMAL LOW (ref 36.0–46.0)
Hemoglobin: 9.2 g/dL — ABNORMAL LOW (ref 12.0–15.0)
MCH: 32.3 pg (ref 26.0–34.0)
MCHC: 32.6 g/dL (ref 30.0–36.0)
MCV: 98.9 fL (ref 78.0–100.0)
PLATELETS: 126 10*3/uL — AB (ref 150–400)
RBC: 2.85 MIL/uL — AB (ref 3.87–5.11)
RDW: 13.6 % (ref 11.5–15.5)
WBC: 9.2 10*3/uL (ref 4.0–10.5)

## 2017-02-22 NOTE — Progress Notes (Signed)
Patient verbalized understanding of discharge instructions and was able to teach information back to Rn. Patient provided with discharge paperwork and discharge prescriptions. RN clarified earlier in shift with Joanell Rising, Pa, about the compression wrap on patient's leg whether it was to stay in place at discharge or not. PA stated to that patient is to be discharge with compression wrap in place and that she can take it off at home when she is showering. He stated that surgical dressing is to stay in place until patient follows up in the office. Rn instructed patient of this and patient verbalized understanding of information. Patient's IV removed. Patient assisted in getting dressed. Patient has 3-n-1 and front wheel walker at bedside and will be leaving with equipment at discharge. Patient's home health physical therapy set up through case manager and patient provided with telephone number for advance home care in case she would need to contact them. Patient taken down in wheelchair and patient's friend taking patient home in their car.

## 2017-02-22 NOTE — Progress Notes (Signed)
Physical Therapy Discharge Patient Details Name: Wendy Edwards MRN: [id number] DOB: 01-13-1966 Today's Date: 02/22/2017 Time: 4975-3005 PT Time Calculation (min) (ACUTE ONLY): 37 min  Patient discharged from PT services secondary to goals met and no further PT needs identified.  Please see latest therapy progress note for current level of functioning and progress toward goals.    Progress and discharge plan discussed with patient and/or caregiver: Patient/Caregiver agrees with plan  GP     [place] 02/22/2017, 3:09 PM  Dani Gobble. Migdalia Dk PT, DPT Acute Rehabilitation  [phone] Pager [phone]

## 2017-02-22 NOTE — Care Management Note (Signed)
Case Management Note  Patient Details  Name: STEPHEN TURNBAUGH MRN: [id number] Date of Birth: 07-30-1966  Subjective/Objective:   51 yr old female s/p right total knee arthroplasty.                 Action/Plan: Case manager called referral to Stevie Kern, [place] Liaison. Patient will have family support at discharge. DME has been delivered.   Expected Discharge Date:  02/22/17               Expected Discharge Plan:  [place]  In-House Referral:  NA  Discharge planning Services  CM Consult  Post Acute Care Choice:  Durable Medical Equipment, Home Health Choice offered to:  Patient  DME Arranged:  3-N-1, Walker rolling DME Agency:  [place]:  PT [facility] Agency:  [place]  Status of Service:  Completed, signed off  If discussed at [place] of Stay Meetings, dates discussed:    Additional Comments:  Ninfa Meeker, RN 02/22/2017, 4:41 PM

## 2017-02-22 NOTE — Progress Notes (Signed)
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 PMH includes HTN, aplastic anemia, and Asthma, .     PT Comments    Pt has met her transfer and gait goals for discharge but would benefit from skilled PT for additional exercises to improve R knee flexion and extension ROM and R LE strength to improve level of function when she returns home.     Follow Up Recommendations  Home health PT     Equipment Recommendations  3in1 (PT)    Recommendations for Other Services       Precautions / Restrictions Precautions Precautions: Knee Precaution Booklet Issued: Yes (comment) (precautions with exercises) Restrictions Weight Bearing Restrictions: Yes RLE Weight Bearing: Weight bearing as tolerated    Mobility  Bed Mobility               General bed mobility comments: in recliner on entry  Transfers Overall transfer level: Modified independent Equipment used: Rolling walker (2 wheeled) (inital vc for push off from bed surface ) Transfers: Sit to/from Stand Sit to Stand: Modified independent (Device/Increase time)         General transfer comment: relies on UE and L LE to come to standing  Ambulation/Gait Ambulation/Gait assistance: Supervision Ambulation Distance (Feet): 250 Feet Assistive device: Rolling walker (2 wheeled) Gait Pattern/deviations: Decreased stance time - right;Decreased step length - right;Decreased step length - left;Decreased weight shift to right;Antalgic;Step-to pattern   Gait velocity interpretation: Below normal speed for age/gender General Gait Details: vc for decreased postier lean to advance R LE, increased knee and hip flexion to reduce stress on back       Balance Overall balance assessment: Needs assistance Sitting-balance  support: Feet supported;No upper extremity supported Sitting balance-Leahy Scale: Good     Standing balance support: During functional activity;No upper extremity supported (able to wash hands without support after using bathroom) Standing balance-Leahy Scale: Good Standing balance comment: able to wash hands and reach outside BoS with no loss of balance                    Cognition Arousal/Alertness: Awake/alert Behavior During Therapy: WFL for tasks assessed/performed Overall Cognitive Status: Within Functional Limits for tasks assessed                      Exercises Total Joint Exercises Quad Sets: AROM;Right;10 reps;Seated Short Arc Quad: AROM;10 reps;Right;Seated Knee Flexion: AROM;10 reps;Seated;Right;AAROM (10 reps self assisted AROM) Goniometric ROM: -8 to 78    General Comments General comments (skin integrity, edema, etc.): Pt having trouble keeping her R hip from externally rotation lying knee on its lateral side. Pt left after treatment in recliner with her foot in the bone pillow and pillows propped at hip to decreased external rotation.       Pertinent Vitals/Pain Pain Assessment: 0-10 Pain Score: 8  Pain Location: R knee, back Pain Descriptors / Indicators: Tightness;Sore;Dull;Aching;Burning;Tender  VSS           PT Goals (current goals can now be found in the care plan section) Acute Rehab PT Goals Patient Stated Goal: go home soon PT Goal Formulation: With patient Time For Goal Achievement: 02/28/17 Potential to Achieve Goals: Good Progress towards PT goals: Progressing toward goals    Frequency  7X/week      PT Plan Current plan remains appropriate    Co-evaluation             End of Session Equipment Utilized During Treatment: Gait belt Activity Tolerance: Patient limited by pain Patient left: in chair;with call bell/phone within reach (pt R foot placed in bone foam for optimal knee extension) Nurse Communication:  Mobility status;Patient requests pain meds PT Visit Diagnosis: Other abnormalities of gait and mobility (R26.89);Pain Pain - Right/Left: Right Pain - part of body: Knee     Time: 4098-1191 PT Time Calculation (min) (ACUTE ONLY): 33 min  Charges:  $Gait Training: 8-22 mins $Therapeutic Exercise: 8-22 mins                    G Codes:       Bailey Mech Fleet 02/22/2017, 12:02 PM  Dani Gobble. Migdalia Dk PT, DPT Acute Rehabilitation  [phone] Pager [phone]

## 2017-02-22 NOTE — Discharge Summary (Signed)
Patient ID: MYKEL MOHL MRN: [id number] DOB/AGE: 1966-02-04 51 y.o.  Admit date: 02/20/2017 Discharge date: 02/22/2017  Admission Diagnoses:  Principal Problem:   Pain due to knee joint prosthesis ([place]), right Active Problems:   S/P revision of total knee, right   Discharge Diagnoses:  Same  Past Medical History:  Diagnosis Date  . 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 Aplastic anemia ([place])    hx/notes 05/09/2007  . Arthritis    "right knee" (02/21/2017)  . Asthma   . Chronic anxiety    Archie Endo 02/04/2016  . COPD (chronic obstructive pulmonary disease) ([place])    hx/notes 05/09/2007  . Daily headache   . Depression   . DVT (deep venous thrombosis) ([facility])    hx upper and lower extremeties/notes 05/09/2007  . GERD (gastroesophageal reflux disease)   . Heart murmur 1974  . History of blood transfusion    "related to my aplastic anemia"  . History of kidney stones   . Hypertension   . Migraine    "a few/year" (02/21/2017)  . Pneumonia 1974   hospitalized "w/double pneumonia"  . Serum sickness    hx/notes 05/09/2007  . Transaminitis    asymptomatic hx/notes 05/09/2007    Surgeries: Procedure(s): TOTAL KNEE REVISION on 02/20/2017   Consultants: Treatment Team:  [name], MD  Discharged Condition: Improved  Hospital Course: CHRISTON GALLAWAY is an 51 y.o. female who was admitted 02/20/2017 for operative treatment ofPain due to knee joint prosthesis ([place]). Patient has severe unremitting pain that affects sleep, daily activities, and work/hobbies. After pre-op clearance the patient was taken to the operating room on 02/20/2017 and underwent  Procedure(s): TOTAL KNEE REVISION.    Patient was given perioperative antibiotics: Anti-infectives    Start     Dose/Rate Route Frequency Ordered Stop   02/20/17 1000  ceFAZolin (ANCEF) IVPB 2g/100 mL premix     2 g 200 mL/hr over 30 Minutes Intravenous To  [facility] 02/17/17 0745 02/20/17 1202       Patient was given sequential compression devices, early ambulation, and chemoprophylaxis to prevent DVT.  Patient benefited maximally from hospital stay and there were no complications.    Recent vital signs: Patient Vitals for the past 24 hrs:  BP Temp Temp src Pulse Resp SpO2  02/22/17 0503 116/78 98.8 F (37.1 C) Oral 92 18 98 %  02/21/17 2204 128/71 97.6 F (36.4 C) Oral 80 18 98 %  02/21/17 1530 132/70 97.6 F (36.4 C) Oral 76 18 98 %     Recent laboratory studies:  Recent Labs  02/21/17 0623 02/22/17 0620  WBC 7.0 9.2  HGB 10.4* 9.2*  HCT 31.5* 28.2*  PLT 138* 126*  NA 137  --   K 3.7  --   CL 103  --   CO2 27  --   BUN 6  --   CREATININE 0.60  --   GLUCOSE 131*  --   CALCIUM 8.8*  --      Discharge Medications:   Allergies as of 02/22/2017      Reactions   Bee Venom Hives      Medication List    TAKE these medications   albuterol 1.25 MG/3ML nebulizer solution Commonly known as:  ACCUNEB TAKE 3 ML(1 VIAL) BY NEBULIZATION 3 TIMES DAILY AS NEEDED FOR WHEEZING. What changed:  Another medication with the same name was changed. Make sure you understand how and when  to take each.   PROAIR HFA 108 (90 Base) MCG/ACT inhaler Generic drug:  albuterol TAKE 2 PUFFS EVERY 6 HOURS AS NEEDED What changed:  See the new instructions.   ALPRAZolam 1 MG tablet Commonly known as:  XANAX TAKE 1 TABLET BY MOUTH 3 TIMES A DAY AS NEEDED FOR ANXIETY   aspirin EC 325 MG tablet Take 1 tablet (325 mg total) by mouth 2 (two) times daily.   ibuprofen 800 MG tablet Commonly known as:  ADVIL,MOTRIN Take 800 mg by mouth every 8 (eight) hours as needed for pain.   methocarbamol 500 MG tablet Commonly known as:  ROBAXIN Take 1 tablet (500 mg total) by mouth 2 (two) times daily with a meal.   oxyCODONE-acetaminophen 5-325 MG tablet Commonly known as:  ROXICET Take 1-2 tablets by mouth every 4 (four) hours as needed.    valsartan 80 MG tablet Commonly known as:  DIOVAN Take 1 tablet (80 mg total) by mouth daily.            Durable Medical Equipment        Start     Ordered   02/20/17 1641  DME Walker rolling  Once    Question:  Patient needs a walker to treat with the following condition  Answer:  S/P revision of total knee, right   02/20/17 1640   02/20/17 1641  DME 3 n 1  Once     02/20/17 1640   02/20/17 1641  DME Bedside commode  Once    Question:  Patient needs a bedside commode to treat with the following condition  Answer:  S/P revision of total knee, right   02/20/17 1640      Diagnostic Studies: Dg Chest 2 View  Result Date: 02/15/2017 CLINICAL DATA:  Preop for knee surgery EXAM: CHEST  2 VIEW COMPARISON:  01/22/2015 CXR FINDINGS: The heart size and mediastinal contours are within normal limits. Both lungs are clear. The visualized skeletal structures are unremarkable. IMPRESSION: No active cardiopulmonary disease. Electronically Signed   By: Ashley Royalty M.D.   On: 02/15/2017 15:55   Nm Bone Scan 3 Phase  Result Date: 01/24/2017 CLINICAL DATA:  [place] 2 months ago. Persistent right knee pain. History of a patellofemoral arthroplasty. EXAM: NUCLEAR MEDICINE 3-PHASE BONE SCAN TECHNIQUE: Radionuclide angiographic images, immediate static blood pool images, and 3-hour delayed static images were obtained of the bilateral lower extremities after intravenous injection of radiopharmaceutical. RADIOPHARMACEUTICALS:  26.4 mCi Tc-60m MDP COMPARISON:  Right knee radiographs 11/13/2016 FINDINGS: Vascular phase: Symmetric flow to both lower extremities. Blood pool phase: Asymmetric uptake around the right knee joint. Delayed phase: Mild diffuse uptake involving the left knee and moderate diffuse uptake involving the right knee. No focal uptake to suggest a fracture. IMPRESSION: 1. Moderate diffuse abnormal uptake on the immediate static and delayed images of the right knee suggesting synovitis and  degenerative disease. 2. Probable mild degenerative changes involving the left knee. 3. No focal uptake to suggest a fracture. Electronically Signed   By: Marijo Sanes M.D.   On: 01/24/2017 15:19    Disposition: 01-Home or Self Care  Discharge Instructions    Call MD / Call 911    Complete by:  As directed    If you experience chest pain or shortness of breath, CALL 911 and be transported to the hospital emergency room.  If you develope a fever above 101 F, pus (white drainage) or increased drainage or redness at the wound, or calf pain, call  your surgeon's office.   Constipation Prevention    Complete by:  As directed    Drink plenty of fluids.  Prune juice may be helpful.  You may use a stool softener, such as Colace (over the counter) 100 mg twice a day.  Use MiraLax (over the counter) for constipation as needed.   Diet - low sodium heart healthy    Complete by:  As directed    Driving restrictions    Complete by:  As directed    No driving for 2 weeks   Increase activity slowly as tolerated    Complete by:  As directed    Patient may shower    Complete by:  As directed    You may shower without a dressing once there is no drainage.  Do not wash over the wound.  If drainage remains, cover wound with plastic wrap and then shower.      Follow-up Information    Kerin Salen, MD Follow up in 2 week(s).   Specialty:  Orthopedic Surgery Contact information: [place] [address] [phone]            Signed: Hardin Negus Quron Ruddy R 02/22/2017, 8:24 AM

## 2017-02-22 NOTE — Progress Notes (Signed)
PATIENT ID: Wendy Edwards  MRN: [id number]  DOB/AGE:  October 08, 1966 / 51 y.o.  2 Days Post-Op Procedure(s) (LRB): TOTAL KNEE REVISION (Right)    PROGRESS NOTE Subjective: Patient is alert, oriented, mild Nausea, no Vomiting, yes passing gas. Taking PO well. Denies SOB, Chest or Calf Pain. Using Incentive Spirometer, PAS in place. Ambulate WBAT with pt walking 125 ft , Patient reports pain as 8-9/10 .    Objective: Vital signs in last 24 hours: Vitals:   02/21/17 0800 02/21/17 1530 02/21/17 2204 02/22/17 0503  BP:  132/70 128/71 116/78  Pulse:  76 80 92  Resp:  18 18 18   Temp:  97.6 F (36.4 C) 97.6 F (36.4 C) 98.8 F (37.1 C)  TempSrc:  Oral Oral Oral  SpO2:  98% 98% 98%  Weight: 72.6 kg (160 lb)     Height: 5\' 3"  (1.6 m)         Intake/Output from previous day: I/O last 3 completed shifts: In: 2085 [P.O.:1200; I.V.:885] Out: 100 [Urine:100]   Intake/Output this shift: No intake/output data recorded.   LABORATORY DATA:  Recent Labs  02/21/17 0623 02/22/17 0620  WBC 7.0 9.2  HGB 10.4* 9.2*  HCT 31.5* 28.2*  PLT 138* 126*  NA 137  --   K 3.7  --   CL 103  --   CO2 27  --   BUN 6  --   CREATININE 0.60  --   GLUCOSE 131*  --   CALCIUM 8.8*  --     Examination: Neurologically intact Neurovascular intact Sensation intact distally Intact pulses distally Dorsiflexion/Plantar flexion intact Incision: scant drainage No cellulitis present Compartment soft}  Assessment:   2 Days Post-Op Procedure(s) (LRB): TOTAL KNEE REVISION (Right) ADDITIONAL DIAGNOSIS: Expected Acute Blood Loss Anemia, Hypertension  Plan: PT/OT WBAT, AROM and PROM  DVT Prophylaxis:  SCDx72hrs, ASA 325 mg BID x 2 weeks DISCHARGE PLAN: Home, likely later today DISCHARGE NEEDS: HHPT, Walker and 3-in-1 comode seat     PHILLIPS, ERIC R 02/22/2017, 8:19 AM

## 2017-02-22 NOTE — Evaluation (Signed)
Occupational Therapy Evaluation Patient Details Name: Wendy Edwards MRN: [id number] DOB: 1966-02-10 Today's Date: 02/22/2017    History of Present Illness Pt is a 51 yo female with history of 2006 R patella femoral knee replacement s/p 02/21/2017 R total knee revision. PMH includes HTN, aplastic anemia, and Asthma, .    Clinical Impression   Patient evaluated by Occupational Therapy with no further acute OT needs identified. All education has been completed and the patient has no further questions. See below for any follow-up Occupational Therapy or equipment needs. OT to sign off. Thank you for referral.      Follow Up Recommendations  No OT follow up    Equipment Recommendations  None recommended by OT    Recommendations for Other Services       Precautions / Restrictions Precautions Precautions: Knee Precaution Booklet Issued: Yes (comment) (precautions with exercises) Restrictions Weight Bearing Restrictions: Yes RLE Weight Bearing: Weight bearing as tolerated      Mobility Bed Mobility               General bed mobility comments: in recliner on entry  Transfers Overall transfer level: Modified independent Equipment used:  (inital vc for push off from bed surface ) Transfers: Sit to/from Stand Sit to Stand: Modified independent (Device/Increase time)         General transfer comment: relies on UE and L LE to come to standing    Balance Overall balance assessment: Needs assistance Sitting-balance support: Feet supported;No upper extremity supported Sitting balance-Leahy Scale: Good     Standing balance support: During functional activity;No upper extremity supported (able to wash hands without support after using bathroom) Standing balance-Leahy Scale: Good Standing balance comment: able to wash hands and reach outside BoS with no loss of balance                            ADL Overall ADL's : Modified independent                                        General ADL Comments: pt able to reach feet to don clothing. pt without clothing at this time . Pt completed toilet and sink level grooming. pt plans to sit beside sink to bath upon d/c .     Vision   Vision Assessment?: No apparent visual deficits     Perception     Praxis      Pertinent Vitals/Pain Pain Assessment: 0-10 Pain Score: 8  Pain Location: R knee, back Pain Descriptors / Indicators: Tightness;Sore;Dull;Aching;Burning;Tender     Hand Dominance Right   Extremity/Trunk Assessment Upper Extremity Assessment Upper Extremity Assessment: Overall WFL for tasks assessed   Lower Extremity Assessment Lower Extremity Assessment: Defer to PT evaluation   Cervical / Trunk Assessment Cervical / Trunk Assessment: Normal   Communication Communication Communication: No difficulties   Cognition Arousal/Alertness: Awake/alert Behavior During Therapy: WFL for tasks assessed/performed Overall Cognitive Status: Within Functional Limits for tasks assessed                     General Comments       Exercises Exercises: Total Joint Other Exercises Other Exercises: Marching in seated AROM Right 10 reps Other Exercises: Assisted knee flexion with towel under R foot, Right, AAROM, 5 reps   Shoulder Instructions  Home Living Family/patient expects to be discharged to:: Private residence Living Arrangements: Alone Available Help at Discharge: Other (Comment) Type of Home: Apartment Home Access: Level entry     Home Layout: One level     Bathroom Shower/Tub: Tub/shower unit;Curtain   Bathroom Toilet: Handicapped height Bathroom Accessibility: Yes   Home Equipment: Environmental consultant - 2 wheels;Shower seat;Grab bars - toilet;Grab bars - tub/shower;Hand held shower head;Adaptive equipment Adaptive Equipment: Reacher        Prior Functioning/Environment Level of Independence: Independent;Independent with assistive device(s)         Comments: wore a brace when ambulating, limited ambulation        OT Problem List:        OT Treatment/Interventions:      OT Goals(Current goals can be found in the care plan section) Acute Rehab OT Goals Patient Stated Goal: go home soon  OT Frequency:     Barriers to D/C:            Co-evaluation              End of Session Equipment Utilized During Treatment: Gait belt;Rolling walker Nurse Communication: Mobility status;Precautions  Activity Tolerance: Patient tolerated treatment well Patient left: in chair  OT Visit Diagnosis: Unsteadiness on feet (R26.81)                ADL either performed or assessed with clinical judgement  Time: 1442-1501 OT Time Calculation (min): 19 min Charges:  OT General Charges $OT Visit: 1 Procedure OT Evaluation $OT Eval Moderate Complexity: 1 Procedure G-Codes:      Jeri Modena   OTR/L Pager: [phone] Office: [phone] .   Parke Poisson B 02/22/2017, 3:04 PM

## 2017-02-22 NOTE — Progress Notes (Signed)
Physical Therapy Treatment Patient Details Name: Wendy Edwards MRN: [id number] DOB: Feb 10, 1966 Today's Date: 02/22/2017    History of Present Illness Pt is a 51 yo female with history of 2006 R patella femoral knee replacement s/p 02/21/2017 R total knee revision. PMH includes HTN, aplastic anemia, and Asthma, .     PT Comments    Pt worked on improving R knee ROM in this treatment session and was able to achieve R knee extension of  -5 degrees seated in the recliner and R knee flexion of 83 degrees seated in the recliner. Pt has been educated for continued exercise to increase her ROM so she will be more functional in her home environment. Pt has met her goals and is appropriate for discharge.    Follow Up Recommendations  Home health PT     Equipment Recommendations  3in1 (PT)    Recommendations for Other Services       Precautions / Restrictions Precautions Precautions: Knee Precaution Booklet Issued: Yes (comment) (precautions with exercises) Restrictions Weight Bearing Restrictions: Yes RLE Weight Bearing: Weight bearing as tolerated    Mobility  Bed Mobility               General bed mobility comments: in recliner on entry  Transfers Overall transfer level: Modified independent Equipment used:  (inital vc for push off from bed surface ) Transfers: Sit to/from Stand Sit to Stand: Modified independent (Device/Increase time)         General transfer comment: relies on UE and L LE to come to standing     Balance Overall balance assessment: Needs assistance Sitting-balance support: Feet supported;No upper extremity supported Sitting balance-Leahy Scale: Good     Standing balance support: During functional activity;No upper extremity supported (able to wash hands without support after using bathroom) Standing balance-Leahy Scale: Good Standing balance comment: able to wash hands and reach outside BoS with no loss of balance                     Cognition Arousal/Alertness: Awake/alert Behavior During Therapy: WFL for tasks assessed/performed Overall Cognitive Status: Within Functional Limits for tasks assessed                      Exercises Total Joint Exercises Quad Sets: AROM;Right;10 reps;Seated Short Arc Quad: AROM;10 reps;Right;Seated Heel Slides: AAROM;Right;10 reps;Seated (in recliner) Hip ABduction/ADduction: AROM;Seated;Right;10 reps Knee Flexion:  (10 reps self assisted AROM) Goniometric ROM: -5 in recliner to 83 seated at edge of recliner Other Exercises Other Exercises: Marching in seated AROM Right 10 reps Other Exercises: Assisted knee flexion with towel under R foot, Right, AAROM, 5 reps            Pertinent Vitals/Pain Pain Assessment: 0-10 Pain Score: 8  Pain Location: R knee, back Pain Descriptors / Indicators: Tightness;Sore;Dull;Aching;Burning;Tender  VSS    Home Living Family/patient expects to be discharged to:: Private residence Living Arrangements: Alone Available Help at Discharge: Other (Comment) Type of Home: Apartment Home Access: Level entry   Home Layout: One level Home Equipment: Walker - 2 wheels;Shower seat;Grab bars - toilet;Grab bars - tub/shower;Hand held shower head;Adaptive equipment      Prior Function Level of Independence: Independent;Independent with assistive device(s)      Comments: wore a brace when ambulating, limited ambulation   PT Goals (current goals can now be found in the care plan section) Acute Rehab PT Goals Patient Stated Goal: go home soon PT Goal Formulation: With  patient Time For Goal Achievement: 02/28/17 Potential to Achieve Goals: Good Progress towards PT goals: Goals met/education completed, patient discharged from PT    Frequency    7X/week      PT Plan Current plan remains appropriate       End of Session   Activity Tolerance: Patient limited by pain Patient left: in chair (OT just entered room for treatment) Nurse  Communication: Mobility status;Patient requests pain meds PT Visit Diagnosis: Other abnormalities of gait and mobility (R26.89);Pain Pain - Right/Left: Right Pain - part of body: Knee     Time: 2346-8873 PT Time Calculation (min) (ACUTE ONLY): 37 min  Charges:  $Therapeutic Exercise: 23-37 mins                    G Codes:       Bailey Mech Fleet 02/22/2017, 3:04 PM  Dani Gobble. Migdalia Dk PT, DPT Acute Rehabilitation  [phone] Pager [phone]

## 2017-03-24 ENCOUNTER — Ambulatory Visit (HOSPITAL_BASED_OUTPATIENT_CLINIC_OR_DEPARTMENT_OTHER): Payer: Medicare Other | Admitting: Family

## 2017-03-24 ENCOUNTER — Other Ambulatory Visit (HOSPITAL_BASED_OUTPATIENT_CLINIC_OR_DEPARTMENT_OTHER): Payer: Medicare Other

## 2017-03-24 VITALS — BP 152/89 | HR 111 | Temp 98.2°F | Resp 17 | Wt 162.4 lb

## 2017-03-24 DIAGNOSIS — D619 Aplastic anemia, unspecified: Secondary | ICD-10-CM | POA: Diagnosis not present

## 2017-03-24 DIAGNOSIS — R161 Splenomegaly, not elsewhere classified: Secondary | ICD-10-CM

## 2017-03-24 DIAGNOSIS — R590 Localized enlarged lymph nodes: Secondary | ICD-10-CM

## 2017-03-24 DIAGNOSIS — F419 Anxiety disorder, unspecified: Secondary | ICD-10-CM

## 2017-03-24 DIAGNOSIS — I10 Essential (primary) hypertension: Secondary | ICD-10-CM | POA: Diagnosis not present

## 2017-03-24 DIAGNOSIS — Z862 Personal history of diseases of the blood and blood-forming organs and certain disorders involving the immune mechanism: Secondary | ICD-10-CM

## 2017-03-24 LAB — COMPREHENSIVE METABOLIC PANEL
ALBUMIN: 4.2 g/dL (ref 3.5–5.0)
ALT: 11 U/L (ref 0–55)
ANION GAP: 10 meq/L (ref 3–11)
AST: 13 U/L (ref 5–34)
Alkaline Phosphatase: 96 U/L (ref 40–150)
BILIRUBIN TOTAL: 0.39 mg/dL (ref 0.20–1.20)
BUN: 11.2 mg/dL (ref 7.0–26.0)
CALCIUM: 9.8 mg/dL (ref 8.4–10.4)
CO2: 23 mEq/L (ref 22–29)
Chloride: 104 mEq/L (ref 98–109)
Creatinine: 0.7 mg/dL (ref 0.6–1.1)
Glucose: 95 mg/dl (ref 70–140)
POTASSIUM: 4.1 meq/L (ref 3.5–5.1)
Sodium: 137 mEq/L (ref 136–145)
TOTAL PROTEIN: 7.6 g/dL (ref 6.4–8.3)

## 2017-03-24 LAB — CBC WITH DIFFERENTIAL (CANCER CENTER ONLY)
BASO#: 0 10*3/uL (ref 0.0–0.2)
BASO%: 0.4 % (ref 0.0–2.0)
EOS ABS: 0.2 10*3/uL (ref 0.0–0.5)
EOS%: 2.9 % (ref 0.0–7.0)
HEMATOCRIT: 42 % (ref 34.8–46.6)
HGB: 14.2 g/dL (ref 11.6–15.9)
LYMPH#: 2.1 10*3/uL (ref 0.9–3.3)
LYMPH%: 37.9 % (ref 14.0–48.0)
MCH: 33.6 pg (ref 26.0–34.0)
MCHC: 33.8 g/dL (ref 32.0–36.0)
MCV: 100 fL (ref 81–101)
MONO#: 0.7 10*3/uL (ref 0.1–0.9)
MONO%: 12.9 % (ref 0.0–13.0)
NEUT#: 2.5 10*3/uL (ref 1.5–6.5)
NEUT%: 45.9 % (ref 39.6–80.0)
RBC: 4.22 10*6/uL (ref 3.70–5.32)
RDW: 14.4 % (ref 11.1–15.7)
WBC: 5.5 10*3/uL (ref 3.9–10.0)

## 2017-03-24 LAB — CHCC SATELLITE - SMEAR

## 2017-03-24 LAB — LACTATE DEHYDROGENASE: LDH: 139 U/L (ref 125–245)

## 2017-03-24 NOTE — Progress Notes (Signed)
Hematology and Oncology Follow Up Visit  Wendy Edwards [id number] 03/04/1966 51 y.o. 03/24/2017   Principle Diagnosis:  1. Aplastic anemia - clinical remission 2. Chronic anxiety 3. Hypertension  Current Therapy:   Observation    Interim History:  Wendy Edwards is is here today for follow-up. She had a total right knee replacement last month on March 5th and has recuperated nicely. She completed PT and has transitioned from a walker now to a cane. She is also wearing a knee brace. She does exercises at home to help improve mobility.  Her Hgb is stable at 14.2 with an MCV of 100. Her platelet count has returned to normal at 177. She denies any episodes of bleeding, bruising or petechiae.  She is asymptomatic with her elevated HR of 111. She states that this is "normal" for her.  No fever, chills, n/v, cough, oral sores, rash, dizziness, SOB, chest pain, palpitations, abdominal pain or changes in bowel or bladder habits.  No swelling in her extremities. She has occasional numbness and tingling in her hands which she states can be positional.  She has maintained a good appetite and is staying well hydrated. Her weight is stable, she has gained 27 lbs since her last visit which she is quite happy about.   ECOG Performance Status: 0 - Asymptomatic  Medications:  Allergies as of 03/24/2017      Reactions   Bee Venom Hives      Medication List       Accurate as of 03/24/17 11:46 AM. Always use your most recent med list.          albuterol 1.25 MG/3ML nebulizer solution Commonly known as:  ACCUNEB TAKE 3 ML(1 VIAL) BY NEBULIZATION 3 TIMES DAILY AS NEEDED FOR WHEEZING.   PROAIR HFA 108 (90 Base) MCG/ACT inhaler Generic drug:  albuterol TAKE 2 PUFFS EVERY 6 HOURS AS NEEDED   ALPRAZolam 1 MG tablet Commonly known as:  XANAX TAKE 1 TABLET BY MOUTH 3 TIMES A DAY AS NEEDED FOR ANXIETY   aspirin EC 325 MG tablet Take 1 tablet (325 mg total) by mouth 2 (two) times daily.   valsartan 80  MG tablet Commonly known as:  DIOVAN Take 1 tablet (80 mg total) by mouth daily.       Allergies:  Allergies  Allergen Reactions  . Bee Venom Hives    Past Medical History, Surgical history, Social history, and Family History were reviewed and updated.  Review of Systems: All other 10 point review of systems is negative.   Physical Exam:  weight is 162 lb 6.4 oz (73.7 kg). Her oral temperature is 98.2 F (36.8 C). Her blood pressure is 152/89 (abnormal) and her pulse is 111 (abnormal). Her respiration is 17 and oxygen saturation is 100%.   Wt Readings from Last 3 Encounters:  03/24/17 162 lb 6.4 oz (73.7 kg)  02/21/17 160 lb (72.6 kg)  02/15/17 160 lb 14.4 oz (73 kg)    Ocular: Sclerae unicteric, pupils equal, round and reactive to light Ear-nose-throat: Oropharynx clear, dentition fair Lymphatic: No cervical, supraclavicular or axillary adenopathy Lungs no rales or rhonchi, good excursion bilaterally Heart regular rate and rhythm, no murmur appreciated Abd soft, nontender, positive bowel sounds, no liver or spleen tip palpated on exam, no fluid wave MSK no focal spinal tenderness, no joint edema, right total knee replacement healing nicely Neuro: non-focal, well-oriented, appropriate affect Breasts: Deferred   Lab Results  Component Value Date   WBC 5.5 03/24/2017  HGB 14.2 03/24/2017   HCT 42.0 03/24/2017   MCV 100 03/24/2017   PLT 177 Platelet count consistent in citrate 03/24/2017   No results found for: FERRITIN, IRON, TIBC, UIBC, IRONPCTSAT Lab Results  Component Value Date   RETICCTPCT 1.9 03/06/2015   RBC 4.22 03/24/2017   RETICCTABS 74.1 03/06/2015   No results found for: KPAFRELGTCHN, LAMBDASER, KAPLAMBRATIO No results found for: IGGSERUM, IGA, IGMSERUM No results found for: Odetta Pink, SPEI   Chemistry      Component Value Date/Time   NA 137 02/21/2017 0623   NA 139 09/19/2016 1339   K 3.7  02/21/2017 0623   K 4.8 (H) 09/19/2016 1339   CL 103 02/21/2017 0623   CL 97 (L) 09/19/2016 1339   CO2 27 02/21/2017 0623   CO2 25 09/19/2016 1339   BUN 6 02/21/2017 0623   BUN 6 (L) 09/19/2016 1339   CREATININE 0.60 02/21/2017 0623   CREATININE 0.6 09/19/2016 1339      Component Value Date/Time   CALCIUM 8.8 (L) 02/21/2017 0623   CALCIUM 10.0 09/19/2016 1339   ALKPHOS 83 09/19/2016 1339   AST 29 09/19/2016 1339   ALT 31 09/19/2016 1339   BILITOT 0.70 09/19/2016 1339      Impression and Plan: Wendy Edwards is 51 yo caucasian female with a remote history of aplastic anemia treated with immunosuppression therapy with anti-thrombocyte globulin around 16 years ago. This appears to be stable. She had a total right knee replacement 1 month ago and her counts have held up nicely. Hgb is 14.2 with an MCV of 100 and platelet count of 177.  She has no complaints at this time and is excited to plan a trip to the beach with a couple of her girl friends for next month.  We will continue to follow along with her and plan to see her back again in 6 months for repeat lab work and follow-up.  I spent around 15 minute face to face counseling with her.  She will contact our office with any questions or concerns. We can certainly see her sooner if need be.   Wendy Bottom, NP 4/6/201811:46 AM

## 2017-04-12 ENCOUNTER — Other Ambulatory Visit: Payer: Self-pay | Admitting: Hematology & Oncology

## 2017-04-26 ENCOUNTER — Encounter: Payer: Medicare Other | Admitting: Family Medicine

## 2017-05-25 ENCOUNTER — Other Ambulatory Visit: Payer: Self-pay | Admitting: Hematology & Oncology

## 2017-06-17 ENCOUNTER — Other Ambulatory Visit: Payer: Self-pay | Admitting: Hematology & Oncology

## 2017-06-17 DIAGNOSIS — I151 Hypertension secondary to other renal disorders: Secondary | ICD-10-CM

## 2017-06-17 DIAGNOSIS — N2889 Other specified disorders of kidney and ureter: Principal | ICD-10-CM

## 2017-06-17 DIAGNOSIS — F064 Anxiety disorder due to known physiological condition: Secondary | ICD-10-CM

## 2017-07-07 ENCOUNTER — Other Ambulatory Visit: Payer: Self-pay | Admitting: Family

## 2017-07-07 ENCOUNTER — Other Ambulatory Visit: Payer: Self-pay | Admitting: Hematology & Oncology

## 2017-09-25 ENCOUNTER — Ambulatory Visit: Payer: Medicare Other | Admitting: Hematology & Oncology

## 2017-09-25 ENCOUNTER — Other Ambulatory Visit: Payer: Medicare Other

## 2017-09-27 ENCOUNTER — Other Ambulatory Visit: Payer: Self-pay | Admitting: Hematology & Oncology

## 2017-10-04 ENCOUNTER — Other Ambulatory Visit: Payer: Self-pay | Admitting: *Deleted

## 2017-10-04 MED ORDER — ALBUTEROL SULFATE HFA 108 (90 BASE) MCG/ACT IN AERS: INHALATION_SPRAY | RESPIRATORY_TRACT | 4 refills | Status: DC

## 2017-10-20 ENCOUNTER — Other Ambulatory Visit: Payer: Medicare Other

## 2017-10-20 ENCOUNTER — Ambulatory Visit: Payer: Medicare Other | Admitting: Family

## 2017-10-27 ENCOUNTER — Other Ambulatory Visit: Payer: Self-pay | Admitting: Family

## 2017-11-27 ENCOUNTER — Ambulatory Visit: Payer: Medicare Other | Admitting: Family

## 2017-11-27 ENCOUNTER — Other Ambulatory Visit: Payer: Medicare Other

## 2017-11-28 ENCOUNTER — Other Ambulatory Visit: Payer: Medicare Other

## 2017-11-28 ENCOUNTER — Ambulatory Visit: Payer: Medicare Other | Admitting: Family

## 2017-12-13 ENCOUNTER — Other Ambulatory Visit: Payer: Self-pay | Admitting: Family

## 2017-12-18 ENCOUNTER — Other Ambulatory Visit: Payer: Self-pay | Admitting: Hematology & Oncology

## 2017-12-22 ENCOUNTER — Other Ambulatory Visit (HOSPITAL_BASED_OUTPATIENT_CLINIC_OR_DEPARTMENT_OTHER): Payer: Medicare Other

## 2017-12-22 ENCOUNTER — Encounter: Payer: Self-pay | Admitting: Hematology & Oncology

## 2017-12-22 ENCOUNTER — Other Ambulatory Visit: Payer: Self-pay

## 2017-12-22 ENCOUNTER — Ambulatory Visit (HOSPITAL_BASED_OUTPATIENT_CLINIC_OR_DEPARTMENT_OTHER): Payer: Medicare Other | Admitting: Hematology & Oncology

## 2017-12-22 VITALS — BP 144/77 | HR 111 | Temp 98.5°F | Resp 20 | Wt 167.0 lb

## 2017-12-22 DIAGNOSIS — Z720 Tobacco use: Secondary | ICD-10-CM | POA: Diagnosis not present

## 2017-12-22 DIAGNOSIS — I10 Essential (primary) hypertension: Secondary | ICD-10-CM

## 2017-12-22 DIAGNOSIS — T386X5A Adverse effect of antigonadotrophins, antiestrogens, antiandrogens, not elsewhere classified, initial encounter: Secondary | ICD-10-CM

## 2017-12-22 DIAGNOSIS — Z862 Personal history of diseases of the blood and blood-forming organs and certain disorders involving the immune mechanism: Secondary | ICD-10-CM

## 2017-12-22 DIAGNOSIS — J4520 Mild intermittent asthma, uncomplicated: Secondary | ICD-10-CM | POA: Diagnosis not present

## 2017-12-22 DIAGNOSIS — M818 Other osteoporosis without current pathological fracture: Secondary | ICD-10-CM

## 2017-12-22 LAB — CMP (CANCER CENTER ONLY)
ALBUMIN: 3.4 g/dL (ref 3.3–5.5)
ALK PHOS: 80 U/L (ref 26–84)
ALT: 24 U/L (ref 10–47)
AST: 20 U/L (ref 11–38)
BILIRUBIN TOTAL: 0.4 mg/dL (ref 0.20–1.60)
BUN, Bld: 6 mg/dL — ABNORMAL LOW (ref 7–22)
CALCIUM: 9.7 mg/dL (ref 8.0–10.3)
CO2: 27 meq/L (ref 18–33)
CREATININE: 0.6 mg/dL (ref 0.6–1.2)
Chloride: 105 mEq/L (ref 98–108)
GLUCOSE: 104 mg/dL (ref 73–118)
Potassium: 3.9 mEq/L (ref 3.3–4.7)
SODIUM: 142 meq/L (ref 128–145)
Total Protein: 6.9 g/dL (ref 6.4–8.1)

## 2017-12-22 LAB — CBC WITH DIFFERENTIAL (CANCER CENTER ONLY)
BASO#: 0 10*3/uL (ref 0.0–0.2)
BASO%: 0.5 % (ref 0.0–2.0)
EOS%: 1.8 % (ref 0.0–7.0)
Eosinophils Absolute: 0.1 10*3/uL (ref 0.0–0.5)
HEMATOCRIT: 41.4 % (ref 34.8–46.6)
HEMOGLOBIN: 14.2 g/dL (ref 11.6–15.9)
LYMPH#: 3.2 10*3/uL (ref 0.9–3.3)
LYMPH%: 41.5 % (ref 14.0–48.0)
MCH: 34 pg (ref 26.0–34.0)
MCHC: 34.3 g/dL (ref 32.0–36.0)
MCV: 99 fL (ref 81–101)
MONO#: 1.3 10*3/uL — ABNORMAL HIGH (ref 0.1–0.9)
MONO%: 16.7 % — ABNORMAL HIGH (ref 0.0–13.0)
NEUT#: 3 10*3/uL (ref 1.5–6.5)
NEUT%: 39.5 % — ABNORMAL LOW (ref 39.6–80.0)
Platelets: 164 10*3/uL (ref 145–400)
RBC: 4.18 10*6/uL (ref 3.70–5.32)
RDW: 12.6 % (ref 11.1–15.7)
WBC: 7.7 10*3/uL (ref 3.9–10.0)

## 2017-12-22 LAB — CHCC SATELLITE - SMEAR

## 2017-12-22 NOTE — Progress Notes (Signed)
Hematology and Oncology Follow Up Visit  Wendy Edwards [id number] 1966-03-16 52 y.o. 12/22/2017   Principle Diagnosis:  1. Aplastic anemia - clinical remission. 2. Chronic anxiety. 3. Hypertension.  Current Therapy:    Observation     Interim History:  Ms.  Edwards is for followup.  She looks pretty good.  She feels good.  She is walking.  She is having some lower back issues which have been chronic.  She is still smoking.  She is cutting back.  She is not having any problems with asthma.  She has had no fever.  She has had no nausea or vomiting.  She has had no bleeding.  There is been no headache.  She is taking her blood pressure medication.  It sounds like she is going to get a new primary care doctor.  She definitely needs one.  She thinks her last mammogram was 2 years ago.  We will have to get her set up with 1.  She did enjoy her 51th birthday back in November.  Currently, her performance status is ECOG 1.  Medications:  Current Outpatient Medications:  .  albuterol (ACCUNEB) 1.25 MG/3ML nebulizer solution, TAKE 3 ML(1 VIAL) BY NEBULIZATION 3 TIMES DAILY AS NEEDED FOR WHEEZING., Disp: 75 mL, Rfl: 1 .  albuterol (PROAIR HFA) 108 (90 Base) MCG/ACT inhaler, TAKE 2 PUFFS EVERY 6 HOURS AS NEEDED, Disp: 8.5 Inhaler, Rfl: 4 .  ALPRAZolam (XANAX) 1 MG tablet, TAKE 1 TABLET BY MOUTH THREE TIMES A DAY AS NEEDED, Disp: 90 tablet, Rfl: 2 .  aspirin EC 325 MG tablet, Take 1 tablet (325 mg total) by mouth 2 (two) times daily., Disp: 30 tablet, Rfl: 0 .  valsartan (DIOVAN) 80 MG tablet, TAKE 1 TABLET (80 MG TOTAL) BY MOUTH DAILY. (Patient not taking: Reported on 12/22/2017), Disp: 90 tablet, Rfl: 2  Allergies:  Allergies  Allergen Reactions  . Bee Venom Hives    Past Medical History, Surgical history, Social history, and Family History were reviewed and updated.  Review of Systems: Review of Systems  Constitutional: Negative.   HENT: Negative.   Eyes: Negative.     Respiratory: Positive for wheezing.   Cardiovascular: Negative.   Gastrointestinal: Positive for heartburn and nausea.  Genitourinary: Negative.   Musculoskeletal: Positive for back pain.  Skin: Negative.   Neurological: Negative.   Endo/Heme/Allergies: Negative.   Psychiatric/Behavioral: Negative.      Physical Exam:  weight is 167 lb (75.8 kg). Her oral temperature is 98.5 F (36.9 C). Her blood pressure is 144/77 (abnormal) and her pulse is 111 (abnormal). Her respiration is 20 and oxygen saturation is 97%.   Physical Exam  Constitutional: She is oriented to person, place, and time.  HENT:  Head: Normocephalic and atraumatic.  Mouth/Throat: Oropharynx is clear and moist.  Eyes: EOM are normal. Pupils are equal, round, and reactive to light.  Neck: Normal range of motion.  Cardiovascular: Normal rate, regular rhythm and normal heart sounds.  Pulmonary/Chest: Effort normal and breath sounds normal.  Abdominal: Soft. Bowel sounds are normal.  Musculoskeletal: Normal range of motion. She exhibits no edema, tenderness or deformity.  Lymphadenopathy:    She has no cervical adenopathy.  Neurological: She is alert and oriented to person, place, and time.  Skin: Skin is warm and dry. No rash noted. No erythema.  Psychiatric: She has a normal mood and affect. Her behavior is normal. Judgment and thought content normal.  Vitals reviewed.   Lab Results  Component Value Date  WBC 7.7 12/22/2017   HGB 14.2 12/22/2017   HCT 41.4 12/22/2017   MCV 99 12/22/2017   PLT 164 12/22/2017     Chemistry      Component Value Date/Time   NA 142 12/22/2017 1436   NA 137 03/24/2017 1113   K 3.9 12/22/2017 1436   K 4.1 03/24/2017 1113   CL 105 12/22/2017 1436   CO2 27 12/22/2017 1436   CO2 23 03/24/2017 1113   BUN 6 (L) 12/22/2017 1436   BUN 11.2 03/24/2017 1113   CREATININE 0.6 12/22/2017 1436   CREATININE 0.7 03/24/2017 1113      Component Value Date/Time   CALCIUM 9.7 12/22/2017  1436   CALCIUM 9.8 03/24/2017 1113   ALKPHOS 80 12/22/2017 1436   ALKPHOS 96 03/24/2017 1113   AST 20 12/22/2017 1436   AST 13 03/24/2017 1113   ALT 24 12/22/2017 1436   ALT 11 03/24/2017 1113   BILITOT 0.40 12/22/2017 1436   BILITOT 0.39 03/24/2017 1113         Impression and Plan: Wendy Edwards is 52 year old white female. She has a remote history of aplastic anemia. She was treated with immunosuppression therapy with anti-thrombocyte globulin. This probably was 17 years ago.  From my perspective, everything looks pretty good.  We will go ahead and plan to get her back in another 6 months.  I will make sure that she gets her mammogram done.  Hopefully, she will continue to cut back on her tobacco use.   Marland KitchenVolanda Napoleon, MD 1/4/20194:00 PM

## 2017-12-23 LAB — LACTATE DEHYDROGENASE (CC13): LDH: 129 IU/L (ref 119–226)

## 2018-01-29 ENCOUNTER — Other Ambulatory Visit: Payer: Self-pay | Admitting: Hematology & Oncology

## 2018-02-13 ENCOUNTER — Other Ambulatory Visit: Payer: Self-pay | Admitting: Family

## 2018-03-11 ENCOUNTER — Other Ambulatory Visit: Payer: Self-pay | Admitting: Hematology & Oncology

## 2018-03-12 ENCOUNTER — Other Ambulatory Visit: Payer: Self-pay | Admitting: *Deleted

## 2018-05-10 ENCOUNTER — Encounter: Payer: Self-pay | Admitting: Family Medicine

## 2018-05-24 ENCOUNTER — Other Ambulatory Visit: Payer: Self-pay | Admitting: Hematology & Oncology

## 2018-06-01 ENCOUNTER — Other Ambulatory Visit: Payer: Self-pay | Admitting: Hematology & Oncology

## 2018-06-01 ENCOUNTER — Other Ambulatory Visit: Payer: Self-pay | Admitting: Family

## 2018-06-12 ENCOUNTER — Other Ambulatory Visit: Payer: Self-pay | Admitting: Hematology & Oncology

## 2018-06-20 ENCOUNTER — Other Ambulatory Visit: Payer: Medicare Other

## 2018-06-20 ENCOUNTER — Other Ambulatory Visit: Payer: Medicare Other | Admitting: Family

## 2018-07-02 ENCOUNTER — Other Ambulatory Visit: Payer: Self-pay | Admitting: Hematology & Oncology

## 2018-07-11 ENCOUNTER — Ambulatory Visit (INDEPENDENT_AMBULATORY_CARE_PROVIDER_SITE_OTHER): Payer: Medicare Other | Admitting: Family Medicine

## 2018-07-11 ENCOUNTER — Ambulatory Visit (INDEPENDENT_AMBULATORY_CARE_PROVIDER_SITE_OTHER): Payer: Medicare Other

## 2018-07-11 ENCOUNTER — Encounter: Payer: Self-pay | Admitting: Family Medicine

## 2018-07-11 VITALS — BP 142/90 | HR 130 | Temp 99.2°F | Ht 63.0 in | Wt 157.0 lb

## 2018-07-11 DIAGNOSIS — Z1239 Encounter for other screening for malignant neoplasm of breast: Secondary | ICD-10-CM | POA: Insufficient documentation

## 2018-07-11 DIAGNOSIS — G8929 Other chronic pain: Secondary | ICD-10-CM | POA: Diagnosis not present

## 2018-07-11 DIAGNOSIS — M545 Low back pain, unspecified: Secondary | ICD-10-CM | POA: Insufficient documentation

## 2018-07-11 DIAGNOSIS — I10 Essential (primary) hypertension: Secondary | ICD-10-CM

## 2018-07-11 DIAGNOSIS — Z862 Personal history of diseases of the blood and blood-forming organs and certain disorders involving the immune mechanism: Secondary | ICD-10-CM

## 2018-07-11 DIAGNOSIS — Z1231 Encounter for screening mammogram for malignant neoplasm of breast: Secondary | ICD-10-CM

## 2018-07-11 LAB — CBC
HCT: 46.7 % — ABNORMAL HIGH (ref 36.0–46.0)
HEMOGLOBIN: 15.6 g/dL — AB (ref 12.0–15.0)
MCHC: 33.5 g/dL (ref 30.0–36.0)
MCV: 103.6 fl — ABNORMAL HIGH (ref 78.0–100.0)
PLATELETS: 169 10*3/uL (ref 150.0–400.0)
RBC: 4.51 Mil/uL (ref 3.87–5.11)
RDW: 14.2 % (ref 11.5–15.5)
WBC: 7.2 10*3/uL (ref 4.0–10.5)

## 2018-07-11 LAB — COMPREHENSIVE METABOLIC PANEL
ALK PHOS: 87 U/L (ref 39–117)
ALT: 34 U/L (ref 0–35)
AST: 50 U/L — ABNORMAL HIGH (ref 0–37)
Albumin: 3.8 g/dL (ref 3.5–5.2)
BUN: 7 mg/dL (ref 6–23)
CHLORIDE: 102 meq/L (ref 96–112)
CO2: 27 meq/L (ref 19–32)
Calcium: 10.4 mg/dL (ref 8.4–10.5)
Creatinine, Ser: 0.6 mg/dL (ref 0.40–1.20)
GFR: 112.15 mL/min (ref >60.00)
GLUCOSE: 101 mg/dL — AB (ref 70–99)
POTASSIUM: 4.8 meq/L (ref 3.5–5.1)
Sodium: 141 mEq/L (ref 135–145)
TOTAL PROTEIN: 7.2 g/dL (ref 6.0–8.3)
Total Bilirubin: 0.4 mg/dL (ref 0.2–1.2)

## 2018-07-11 LAB — TSH: TSH: 1 u[IU]/mL (ref 0.35–4.50)

## 2018-07-11 MED ORDER — ONDANSETRON HCL 4 MG PO TABS: 4.0000 mg | ORAL_TABLET | Freq: Three times a day (TID) | ORAL | 0 refills | Status: DC | PRN

## 2018-07-11 MED ORDER — AMLODIPINE BESYLATE 5 MG PO TABS: 5.0000 mg | ORAL_TABLET | Freq: Every day | ORAL | 3 refills | Status: DC

## 2018-07-11 NOTE — Assessment & Plan Note (Signed)
In remission Continues to follow with Hematology.

## 2018-07-11 NOTE — Assessment & Plan Note (Signed)
Lumbar xray ordered given chronic nature, suspect facet arthritis contributing.

## 2018-07-11 NOTE — Patient Instructions (Signed)
Your new blood pressure medication is Amlodipine 5mg .  Take this daily.  Follow a low salt diet Work on quitting smoking  For nausea you may use zofran as directed  We'll call you with lab and xray results.

## 2018-07-11 NOTE — Assessment & Plan Note (Signed)
Uncontrolled Start amlodipine 5mg  Low sodium diet Counseled on quitting smoking.  F/u in 3 months.

## 2018-07-11 NOTE — Assessment & Plan Note (Signed)
Screening mammogram ordered

## 2018-07-11 NOTE — Progress Notes (Signed)
ZOEE HEENEY - 52 y.o. female MRN [id number]  Date of birth: 12-Jul-1966  Subjective Chief Complaint  Patient presents with  . Establish Care    2 years of back pain, nothing specific happened it just started hurting.  . Nausea    march 14 had 15 teeth pulled and since then she has been nausea, sick and can't keep things down.    HPI ADRYAN DRUCKENMILLER is a 52 y.o. female with a history of aplastic anemia, hypertension, copd, OA and anxiety here today to establish with new pcp. She has the following concerns today.   -Nausea:  Reports nausea and decreased appetite since having teeth extracted in March.  Is able to eat soft foods such as mashed potatoes but has tried ensure and this makes her nauseous.  She gets her dentures in a couple of weeks and is hopeful that this will improve her appetite.  She denies abdominal pain, diarrhea or blood in her stool.   -HTN: Previous treatment with valsartan.  She had done well on this medication however she tells me that she was contacted by her pharmacy and instructed to discontinue immediately due to recall.  This was never replaced after stopping.  She does not monitor BP at home.  She does continue to smoke about 6 cigarettes per day.  She denies anginal symptoms, increased shortness of breath, palpitations, headache or vision changes.    -Low back pain:  Reports chronic low back pain x2 years.  Pain is located in lower back on both sides.  Denies prior injury.  Pain is worse with standing or moving around and is improved by sitting.  She denies radiation of pain, numbness, tingling, extremity weakness.  She has not tried anything for treatment.    -aplastic anemia: In remission.  Continues to follow with heme/onc twice per year.    ROS:  A comprehensive ROS was completed and negative except as noted per HPI  Allergies  Allergen Reactions  . Bee Venom Hives    Past Medical History:  Diagnosis Date  . Anemia    hx aplastic anemia treated with  immunosuppression therapy with anti-thrombocyte globulin/notes 02/04/2016  . Aplastic anemia ([facility])    hx/notes 05/09/2007  . Arthritis    "right knee" (02/21/2017)  . Asthma   . Chronic anxiety    Archie Endo 02/04/2016  . COPD (chronic obstructive pulmonary disease) ([facility])    hx/notes 05/09/2007  . Daily headache   . Depression   . DVT (deep venous thrombosis) ([facility])    hx upper and lower extremeties/notes 05/09/2007  . GERD (gastroesophageal reflux disease)   . Heart murmur 1974  . History of blood transfusion    "related to my aplastic anemia"  . History of kidney stones   . Hypertension   . Migraine    "a few/year" (02/21/2017)  . Pneumonia 1974   hospitalized "w/double pneumonia"  . Serum sickness    hx/notes 05/09/2007  . Transaminitis    asymptomatic hx/notes 05/09/2007    Past Surgical History:  Procedure Laterality Date  . CYSTOSCOPY W/ URETEROSCOPY W/ LITHOTRIPSY    . EXTREMITY CYST EXCISION Left 2017   upper, inner arm  . KNEE ARTHROSCOPY Right 1985  . MEDIAL PARTIAL KNEE REPLACEMENT Right 2005  . TOTAL ABDOMINAL HYSTERECTOMY  2003   hx/notes 05/09/2007  . TOTAL KNEE REVISION Right 02/20/2017   Procedure: TOTAL KNEE REVISION;  Surgeon: Frederik Pear, MD;  Location: [facility];  Service: Orthopedics;  Laterality: Right;  .  TUBAL LIGATION    . TUNNELED VENOUS CATHETER PLACEMENT Right    hx/notes 05/09/2007; "took it out ~ 1 wk later"    Social History   Socioeconomic History  . Marital status: Legally Separated    Spouse name: Not on file  . Number of children: 3  . Years of education: Not on file  . Highest education level: Not on file  Occupational History    Employer: UNEMPLOYED  Social Needs  . Financial resource strain: Not on file  . Food insecurity:    Worry: Not on file    Inability: Not on file  . Transportation needs:    Medical: Not on file    Non-medical: Not on file  Tobacco Use  . Smoking status: Current Every Day Smoker    Packs/day: 0.50    Years: 28.00      Pack years: 14.00    Types: Cigarettes    Start date: 03/01/1988  . Smokeless tobacco: Never Used  Substance and Sexual Activity  . Alcohol use: Yes    Alcohol/week: 7.2 oz    Types: 12 Cans of beer per week  . Drug use: No  . Sexual activity: Never  Lifestyle  . Physical activity:    Days per week: Not on file    Minutes per session: Not on file  . Stress: Not on file  Relationships  . Social connections:    Talks on phone: Not on file    Gets together: Not on file    Attends religious service: Not on file    Active member of club or organization: Not on file    Attends meetings of clubs or organizations: Not on file    Relationship status: Not on file  Other Topics Concern  . Not on file  Social History Narrative   Lives with brother.    Family History  Problem Relation Age of Onset  . Coronary artery disease Father 65       CABG  . Cancer Father   . Hypertension Mother   . Cancer Mother     Health Maintenance  Topic Date Due  . HIV Screening  10/23/1982  . TETANUS/TDAP  10/23/1986  . PAP SMEAR  10/23/1988  . MAMMOGRAM  10/23/2017  . COLONOSCOPY  10/23/2017  . INFLUENZA VACCINE  07/19/2018    ----------------------------------------------------------------------------------------------------------------------------------------------------------------------------------------------------------------- Physical Exam BP (!) 142/90 (BP Location: Left Arm, Patient Position: Sitting, Cuff Size: Normal)   Pulse (!) 130   Temp 99.2 F (37.3 C) (Oral)   Ht 5\' 3"  (1.6 m)   Wt 157 lb (71.2 kg)   SpO2 95%   BMI 27.81 kg/m   Physical Exam  Constitutional: She is oriented to person, place, and time. She appears well-nourished. No distress.  HENT:  Head: Normocephalic and atraumatic.  Mouth/Throat: Oropharynx is clear and moist.  edentulous   Eyes: No scleral icterus.  Neck: Neck supple. No thyromegaly present.  Cardiovascular: Normal rate, regular rhythm,  normal heart sounds and intact distal pulses.  Pulmonary/Chest: Effort normal and breath sounds normal.  Abdominal: Soft. Bowel sounds are normal. She exhibits no distension. There is no tenderness.  Musculoskeletal: She exhibits no edema.  ROM of lumbar spine is normal.  No spasm or step off palpated.    Lymphadenopathy:    She has no cervical adenopathy.  Neurological: She is alert and oriented to person, place, and time. No cranial nerve deficit.  Skin: Skin is warm and dry. No rash noted.  Psychiatric: She  has a normal mood and affect. Her behavior is normal.    ------------------------------------------------------------------------------------------------------------------------------------------------------------------------------------------------------------------- Assessment and Plan  Chronic bilateral low back pain without sciatica Lumbar xray ordered given chronic nature, suspect facet arthritis contributing.   HTN (hypertension) Uncontrolled Start amlodipine 5mg  Low sodium diet Counseled on quitting smoking.  F/u in 3 months.     Hx of aplastic anemia In remission Continues to follow with Hematology.   Screening for breast cancer Screening mammogram ordered.

## 2018-07-13 NOTE — Progress Notes (Signed)
-  Xray shows arthritis of the spine as well as kidney stone.  The stone should not cause any pain in its current location.  I would recommend a trial of anti-inflammatory for the back.  -She has mild elevation in liver enzymes. She should follow a healthy diet with low fat foods and limit any alcohol intake to no more than 1-2 standard size drinks per day.

## 2018-07-21 ENCOUNTER — Other Ambulatory Visit: Payer: Self-pay | Admitting: Hematology & Oncology

## 2018-08-01 ENCOUNTER — Inpatient Hospital Stay (HOSPITAL_BASED_OUTPATIENT_CLINIC_OR_DEPARTMENT_OTHER): Payer: Medicare Other | Admitting: Family

## 2018-08-01 ENCOUNTER — Inpatient Hospital Stay: Payer: Medicare Other | Attending: Hematology & Oncology

## 2018-08-01 ENCOUNTER — Other Ambulatory Visit: Payer: Self-pay

## 2018-08-01 ENCOUNTER — Encounter: Payer: Self-pay | Admitting: Family

## 2018-08-01 VITALS — BP 157/86 | HR 117 | Temp 98.4°F | Resp 18 | Wt 154.5 lb

## 2018-08-01 DIAGNOSIS — D619 Aplastic anemia, unspecified: Secondary | ICD-10-CM | POA: Insufficient documentation

## 2018-08-01 DIAGNOSIS — M818 Other osteoporosis without current pathological fracture: Secondary | ICD-10-CM

## 2018-08-01 DIAGNOSIS — Z862 Personal history of diseases of the blood and blood-forming organs and certain disorders involving the immune mechanism: Secondary | ICD-10-CM

## 2018-08-01 DIAGNOSIS — T386X5A Adverse effect of antigonadotrophins, antiestrogens, antiandrogens, not elsewhere classified, initial encounter: Secondary | ICD-10-CM

## 2018-08-01 DIAGNOSIS — E559 Vitamin D deficiency, unspecified: Secondary | ICD-10-CM

## 2018-08-01 LAB — CMP (CANCER CENTER ONLY)
ALT: 63 U/L — ABNORMAL HIGH (ref 10–47)
AST: 111 U/L — ABNORMAL HIGH (ref 11–38)
Albumin: 3.6 g/dL (ref 3.5–5.0)
Alkaline Phosphatase: 94 U/L — ABNORMAL HIGH (ref 26–84)
Anion gap: 12 (ref 5–15)
BUN: 3 mg/dL — AB (ref 7–22)
CHLORIDE: 104 mmol/L (ref 98–108)
CO2: 24 mmol/L (ref 18–33)
Calcium: 9.3 mg/dL (ref 8.0–10.3)
Creatinine: 0.6 mg/dL (ref 0.60–1.20)
GLUCOSE: 94 mg/dL (ref 73–118)
POTASSIUM: 3.3 mmol/L (ref 3.3–4.7)
SODIUM: 140 mmol/L (ref 128–145)
Total Bilirubin: 0.5 mg/dL (ref 0.2–1.6)
Total Protein: 7.1 g/dL (ref 6.4–8.1)

## 2018-08-01 LAB — CBC WITH DIFFERENTIAL (CANCER CENTER ONLY)
Basophils Absolute: 0 10*3/uL (ref 0.0–0.1)
Basophils Relative: 1 %
EOS ABS: 0.1 10*3/uL (ref 0.0–0.5)
EOS PCT: 1 %
HCT: 42.8 % (ref 34.8–46.6)
Hemoglobin: 14.5 g/dL (ref 11.6–15.9)
Lymphocytes Relative: 46 %
Lymphs Abs: 2.6 10*3/uL (ref 0.9–3.3)
MCH: 34.9 pg — AB (ref 26.0–34.0)
MCHC: 33.9 g/dL (ref 32.0–36.0)
MCV: 102.9 fL — ABNORMAL HIGH (ref 81.0–101.0)
MONO ABS: 1.1 10*3/uL — AB (ref 0.1–0.9)
MONOS PCT: 20 %
Neutro Abs: 1.8 10*3/uL (ref 1.5–6.5)
Neutrophils Relative %: 32 %
PLATELETS: 113 10*3/uL — AB (ref 145–400)
RBC: 4.16 MIL/uL (ref 3.70–5.32)
RDW: 13.3 % (ref 11.1–15.7)
WBC Count: 5.6 10*3/uL (ref 3.9–10.0)

## 2018-08-01 NOTE — Progress Notes (Signed)
Hematology and Oncology Follow Up Visit  Wendy Edwards [id number] May 16, 1966 52 y.o. 08/01/2018   Principle Diagnosis:  Aplastic anemia - clinical remission Chronic anxiety Hypertension  Current Therapy:   Observation   Interim History: Wendy Edwards is here today for follow-up. She is doing well but is having a hard time getting used to her new dentures. She had 15 teeth pulled in March and picked up her dentures last week. She is wearing them 10 minutes a day right now.  Her appetite is down due to her not having her teeth in all the time. She states she is staying well hydrated. Her weight is stable.  She feels weak when she doesn't eat well. She doesn't like Boost.  No falls or syncopal episodes. No episodes of bleeding. She is on full dose aspirin daily and does bruise easily.  No fever, chills, n/v, cough, rash, chest pain, palpitations, abdominal pain or changes in bowel or bladder habits.  She has some SOB with asthma and is still smoking 6 cigarettes a day.  She will have occasional episodes of nauseas with stress.  No swelling, tenderness, numbness or tingling in her extremities. She has lower back pain she states from a kidney stone that comes and goes.   ECOG Performance Status: 1 - Symptomatic but completely ambulatory  Medications:  Allergies as of 08/01/2018      Reactions   Bee Venom Hives      Medication List        Accurate as of 08/01/18  2:19 PM. Always use your most recent med list.          albuterol 1.25 MG/3ML nebulizer solution Commonly known as:  ACCUNEB TAKE 3 ML(1 VIAL) BY NEBULIZATION 3 TIMES DAILY AS NEEDED FOR WHEEZING.   PROAIR HFA 108 (90 Base) MCG/ACT inhaler Generic drug:  albuterol INHALE 2 PUFFS BY MOUTH EVERY 6 HOURS AS NEEDED   ALPRAZolam 1 MG tablet Commonly known as:  XANAX TAKE 1 TABLET BY MOUTH THREE TIMES A DAY AS NEEDED   amLODipine 5 MG tablet Commonly known as:  NORVASC Take 1 tablet (5 mg total) by mouth daily.     aspirin EC 325 MG tablet Take 1 tablet (325 mg total) by mouth 2 (two) times daily.   ondansetron 4 MG tablet Commonly known as:  ZOFRAN Take 1 tablet (4 mg total) by mouth every 8 (eight) hours as needed for nausea or vomiting.       Allergies:  Allergies  Allergen Reactions  . Bee Venom Hives    Past Medical History, Surgical history, Social history, and Family History were reviewed and updated.  Review of Systems: All other 10 point review of systems is negative.   Physical Exam:  vitals were not taken for this visit.   Wt Readings from Last 3 Encounters:  07/11/18 157 lb (71.2 kg)  12/22/17 167 lb (75.8 kg)  03/24/17 162 lb 6.4 oz (73.7 kg)    Ocular: Sclerae unicteric, pupils equal, round and reactive to light Ear-nose-throat: Oropharynx clear, dentition fair Lymphatic: No cervical, supraclavicular or axillary adenopathy Lungs no rales or rhonchi, good excursion bilaterally Heart regular rate and rhythm, no murmur appreciated Abd soft, nontender, positive bowel sounds, no liver or spleen tip palpated on exam, no fluid wave  MSK no focal spinal tenderness, no joint edema Neuro: non-focal, well-oriented, appropriate affect Breasts: Deferred   Lab Results  Component Value Date   WBC 5.6 08/01/2018   HGB 14.5 08/01/2018  HCT 42.8 08/01/2018   MCV 102.9 (H) 08/01/2018   PLT 113 (L) 08/01/2018   No results found for: FERRITIN, IRON, TIBC, UIBC, IRONPCTSAT Lab Results  Component Value Date   RETICCTPCT 1.9 03/06/2015   RBC 4.16 08/01/2018   RETICCTABS 74.1 03/06/2015   No results found for: KPAFRELGTCHN, LAMBDASER, KAPLAMBRATIO No results found for: IGGSERUM, IGA, IGMSERUM No results found for: Odetta Pink, SPEI   Chemistry      Component Value Date/Time   NA 141 07/11/2018 1356   NA 142 12/22/2017 1436   NA 137 03/24/2017 1113   K 4.8 07/11/2018 1356   K 3.9 12/22/2017 1436   K 4.1 03/24/2017  1113   CL 102 07/11/2018 1356   CL 105 12/22/2017 1436   CO2 27 07/11/2018 1356   CO2 27 12/22/2017 1436   CO2 23 03/24/2017 1113   BUN 7 07/11/2018 1356   BUN 6 (L) 12/22/2017 1436   BUN 11.2 03/24/2017 1113   CREATININE 0.60 07/11/2018 1356   CREATININE 0.6 12/22/2017 1436   CREATININE 0.7 03/24/2017 1113      Component Value Date/Time   CALCIUM 10.4 07/11/2018 1356   CALCIUM 9.7 12/22/2017 1436   CALCIUM 9.8 03/24/2017 1113   ALKPHOS 87 07/11/2018 1356   ALKPHOS 80 12/22/2017 1436   ALKPHOS 96 03/24/2017 1113   AST 50 (H) 07/11/2018 1356   AST 20 12/22/2017 1436   AST 13 03/24/2017 1113   ALT 34 07/11/2018 1356   ALT 24 12/22/2017 1436   ALT 11 03/24/2017 1113   BILITOT 0.4 07/11/2018 1356   BILITOT 0.40 12/22/2017 1436   BILITOT 0.39 03/24/2017 1113      Impression and Plan: Wendy Edwards is a very pleasant 52 yo caucasian female with remote history of aplastic anemia treated with immunosuppression therapy with ani-thrombocyte globulin years ago.  She continues to do well and hopes to get accustomed to her dentures soon.  We will continue to follow along with her and plan to see her back in another 8 months.  She will contact our office with any questions or concerns. We can certainly see her sooner if need be.   Laverna Peace, NP 8/14/20192:19 PM

## 2018-08-02 ENCOUNTER — Telehealth: Payer: Self-pay | Admitting: *Deleted

## 2018-08-02 ENCOUNTER — Other Ambulatory Visit: Payer: Self-pay | Admitting: Family

## 2018-08-02 DIAGNOSIS — E559 Vitamin D deficiency, unspecified: Secondary | ICD-10-CM

## 2018-08-02 LAB — VITAMIN D 25 HYDROXY (VIT D DEFICIENCY, FRACTURES): VIT D 25 HYDROXY: 4.8 ng/mL — AB (ref 30.0–100.0)

## 2018-08-02 LAB — LACTATE DEHYDROGENASE: LDH: 212 U/L — ABNORMAL HIGH (ref 98–192)

## 2018-08-02 MED ORDER — ERGOCALCIFEROL 1.25 MG (50000 UT) PO CAPS: 50000.0000 [IU] | ORAL_CAPSULE | ORAL | 6 refills | Status: DC

## 2018-08-02 NOTE — Telephone Encounter (Addendum)
Patient is aware of results. She is aware of results  ----- Message from Eliezer Bottom, NP sent at 08/02/2018  9:35 AM EDT ----- Vit D is super low, prescription sent to her pharmacy for Vitamin D 50,000 units PO once a week. Thank you!   ----- Message ----- From: [name], MD Sent: 08/02/2018   6:30 AM EDT To: Eliezer Bottom, NP  She needs Vit D badly!!!  pete ----- Message ----- From: Buel Ream, [facility] [place] Sent: 08/01/2018   2:16 PM EDT To: [name], MD

## 2018-08-12 ENCOUNTER — Other Ambulatory Visit: Payer: Self-pay | Admitting: Hematology & Oncology

## 2018-08-23 ENCOUNTER — Other Ambulatory Visit: Payer: Self-pay | Admitting: Hematology & Oncology

## 2018-09-03 ENCOUNTER — Other Ambulatory Visit: Payer: Self-pay | Admitting: Hematology & Oncology

## 2018-09-14 ENCOUNTER — Emergency Department (HOSPITAL_COMMUNITY): Payer: Medicare Other

## 2018-09-14 ENCOUNTER — Encounter (HOSPITAL_COMMUNITY): Payer: Self-pay

## 2018-09-14 ENCOUNTER — Emergency Department (HOSPITAL_COMMUNITY)
Admission: EM | Admit: 2018-09-14 | Discharge: 2018-09-15 | Disposition: A | Discharge status: Home / Self Care | Payer: Medicare Other | Attending: Emergency Medicine | Admitting: Emergency Medicine

## 2018-09-14 ENCOUNTER — Other Ambulatory Visit: Payer: Self-pay

## 2018-09-14 DIAGNOSIS — E860 Dehydration: Secondary | ICD-10-CM | POA: Insufficient documentation

## 2018-09-14 DIAGNOSIS — I10 Essential (primary) hypertension: Secondary | ICD-10-CM | POA: Insufficient documentation

## 2018-09-14 DIAGNOSIS — D619 Aplastic anemia, unspecified: Secondary | ICD-10-CM | POA: Diagnosis not present

## 2018-09-14 DIAGNOSIS — R112 Nausea with vomiting, unspecified: Secondary | ICD-10-CM | POA: Insufficient documentation

## 2018-09-14 DIAGNOSIS — J449 Chronic obstructive pulmonary disease, unspecified: Secondary | ICD-10-CM | POA: Diagnosis not present

## 2018-09-14 DIAGNOSIS — F17210 Nicotine dependence, cigarettes, uncomplicated: Secondary | ICD-10-CM | POA: Insufficient documentation

## 2018-09-14 DIAGNOSIS — K760 Fatty (change of) liver, not elsewhere classified: Secondary | ICD-10-CM | POA: Diagnosis not present

## 2018-09-14 DIAGNOSIS — R1110 Vomiting, unspecified: Secondary | ICD-10-CM | POA: Diagnosis present

## 2018-09-14 DIAGNOSIS — R000 Tachycardia, unspecified: Secondary | ICD-10-CM | POA: Insufficient documentation

## 2018-09-14 DIAGNOSIS — R42 Dizziness and giddiness: Secondary | ICD-10-CM | POA: Diagnosis not present

## 2018-09-14 DIAGNOSIS — R110 Nausea: Secondary | ICD-10-CM | POA: Diagnosis not present

## 2018-09-14 LAB — URINALYSIS, ROUTINE W REFLEX MICROSCOPIC
BILIRUBIN URINE: NEGATIVE
GLUCOSE, UA: NEGATIVE mg/dL
Hgb urine dipstick: NEGATIVE
KETONES UR: 80 mg/dL — AB
NITRITE: NEGATIVE
PH: 5 (ref 5.0–8.0)
Protein, ur: 30 mg/dL — AB
Specific Gravity, Urine: 1.018 (ref 1.005–1.030)

## 2018-09-14 LAB — COMPREHENSIVE METABOLIC PANEL
ALT: 49 U/L — ABNORMAL HIGH (ref 0–44)
AST: 73 U/L — AB (ref 15–41)
Albumin: 3.6 g/dL (ref 3.5–5.0)
Alkaline Phosphatase: 91 U/L (ref 38–126)
Anion gap: 21 — ABNORMAL HIGH (ref 5–15)
BUN: 6 mg/dL (ref 6–20)
CO2: 19 mmol/L — ABNORMAL LOW (ref 22–32)
CREATININE: 0.55 mg/dL (ref 0.44–1.00)
Calcium: 9.2 mg/dL (ref 8.9–10.3)
Chloride: 103 mmol/L (ref 98–111)
GFR calc Af Amer: >60 mL/min (ref >60)
GFR calc non Af Amer: >60 mL/min (ref >60)
Glucose, Bld: 83 mg/dL (ref 70–99)
POTASSIUM: 3.5 mmol/L (ref 3.5–5.1)
Sodium: 143 mmol/L (ref 135–145)
TOTAL PROTEIN: 6.8 g/dL (ref 6.5–8.1)
Total Bilirubin: 1.4 mg/dL — ABNORMAL HIGH (ref 0.3–1.2)

## 2018-09-14 LAB — CBC WITH DIFFERENTIAL/PLATELET
BASOS PCT: 1 %
Basophils Absolute: 0.1 10*3/uL (ref 0.0–0.1)
EOS ABS: 0.1 10*3/uL (ref 0.0–0.7)
EOS PCT: 1 %
HCT: 44.6 % (ref 36.0–46.0)
Hemoglobin: 15.3 g/dL — ABNORMAL HIGH (ref 12.0–15.0)
LYMPHS PCT: 14 %
Lymphs Abs: 1.3 10*3/uL (ref 0.7–4.0)
MCH: 35.7 pg — ABNORMAL HIGH (ref 26.0–34.0)
MCHC: 34.3 g/dL (ref 30.0–36.0)
MCV: 104.2 fL — ABNORMAL HIGH (ref 78.0–100.0)
Monocytes Absolute: 1.8 10*3/uL — ABNORMAL HIGH (ref 0.1–1.0)
Monocytes Relative: 18 %
Neutro Abs: 6.3 10*3/uL (ref 1.7–7.7)
Neutrophils Relative %: 66 %
PLATELETS: 163 10*3/uL (ref 150–400)
RBC: 4.28 MIL/uL (ref 3.87–5.11)
RDW: 13.8 % (ref 11.5–15.5)
WBC: 9.5 10*3/uL (ref 4.0–10.5)

## 2018-09-14 MED ORDER — ONDANSETRON HCL 4 MG/2ML IJ SOLN: 4.0000 mg | Freq: Once | INTRAMUSCULAR | Status: DC

## 2018-09-14 MED ORDER — ONDANSETRON HCL 4 MG/2ML IJ SOLN
4.0000 mg | Freq: Once | INTRAMUSCULAR | Status: AC
  Administered 2018-09-14: 4 mg via INTRAVENOUS
  Filled 2018-09-14: qty 2

## 2018-09-14 MED ORDER — SODIUM CHLORIDE 0.9 % IV BOLUS
1000.0000 mL | Freq: Once | INTRAVENOUS | Status: AC
  Administered 2018-09-14: 1000 mL via INTRAVENOUS

## 2018-09-14 NOTE — ED Notes (Signed)
Bed: WA20 Expected date:  Expected time:  Means of arrival:  Comments: N/V 

## 2018-09-14 NOTE — ED Triage Notes (Signed)
Pt BIBA from home. Pt has had nausea and vomiting x 2 weeks. Pt states that she has had a cough with it as well, which causes chest pain. Pt states she has not taken her regular medication because of the vomiting. 4 zofran IM given, 324 ASA given.

## 2018-09-14 NOTE — ED Notes (Signed)
Patient given half a cup of ice chips. 

## 2018-09-14 NOTE — ED Provider Notes (Signed)
[facility] DEPT Provider Note   CSN: [id number] Arrival date & time: 09/14/18  1709     History   Chief Complaint Chief Complaint  Patient presents with  . Emesis    HPI Wendy Edwards is a 52 y.o. female.  HPI Patient presents with concern of nausea, vomiting, weakness. Patient has multiple medical issues including pulmonary disease, anxiety, or all of the aplastic anemia Early, she also had all of her teeth removed 6 months ago due to persistent DKA. She notes that since that time she has had difficulty with maintaining her weight, and with eating and drinking, but now over the past 3 or 4 days she has had persistent anorexia, nausea, post prandial vomiting, liquids and solids. There is associated worsening weakness, without focal abdominal pain, without focal chest pains She does have a history of kidney stone, diagnosed earlier this past summer She has oliguria, but no dysuria, no hematuria. No diarrhea. Over the past few days no relief with anything. Past Medical History:  Diagnosis Date  . 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 Aplastic anemia ([place])    hx/notes 05/09/2007  . Arthritis    "right knee" (02/21/2017)  . Asthma   . Chronic anxiety    Archie Endo 02/04/2016  . COPD (chronic obstructive pulmonary disease) ([place])    hx/notes 05/09/2007  . Daily headache   . Depression   . DVT (deep venous thrombosis) ([facility])    hx upper and lower extremeties/notes 05/09/2007  . GERD (gastroesophageal reflux disease)   . Heart murmur 1974  . History of blood transfusion    "related to my aplastic anemia"  . History of kidney stones   . Hypertension   . Migraine    "a few/year" (02/21/2017)  . Pneumonia 1974   hospitalized "w/double pneumonia"  . Serum sickness    hx/notes 05/09/2007  . Transaminitis    asymptomatic hx/notes 05/09/2007    Patient Active Problem List   Diagnosis Date Noted  . Chronic bilateral low back pain without sciatica 07/11/2018  . Screening for breast cancer 07/11/2018  . 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 Pain due to knee joint prosthesis ([place]), right 02/16/2017  . Hx of aplastic anemia 10/31/2012  . HTN (hypertension) 06/19/2012  . Tobacco abuse 05/18/2012    Past Surgical History:  Procedure Laterality Date  . CYSTOSCOPY W/ URETEROSCOPY W/ LITHOTRIPSY    . EXTREMITY CYST EXCISION Left 2017   upper, inner arm  . KNEE ARTHROSCOPY Right 1985  . MEDIAL PARTIAL KNEE REPLACEMENT Right 2005  . TOTAL ABDOMINAL HYSTERECTOMY  2003   hx/notes 05/09/2007  . TOTAL KNEE REVISION Right 02/20/2017   Procedure: TOTAL KNEE REVISION;  Surgeon: Frederik Pear, MD;  Location: [facility];  Service: Orthopedics;  Laterality: Right;  . TUBAL LIGATION    . TUNNELED VENOUS CATHETER PLACEMENT Right    hx/notes 05/09/2007; "took it out ~ 1 wk later"     OB History   None      Home Medications    Prior to Admission medications   Medication Sig Start Date End Date Taking? Authorizing Provider  albuterol (ACCUNEB) 1.25 MG/3ML nebulizer solution TAKE 3 ML(1 VIAL) BY NEBULIZATION 3 TIMES DAILY AS NEEDED FOR WHEEZING. Patient taking differently: Take 1 ampule by nebulization every 6 (six) hours as needed for wheezing or shortness of breath.  06/01/18  Yes Cerulean, Holli Humbles, NP  ALPRAZolam Duanne Moron)  1 MG tablet TAKE 1 TABLET BY MOUTH THREE TIMES A DAY AS NEEDED Patient taking differently: Take 1 mg by mouth 2 (two) times daily as needed for anxiety.  08/23/18  Yes [place], Holli Humbles, NP  amLODipine (NORVASC) 5 MG tablet Take 1 tablet (5 mg total) by mouth daily. 07/11/18  Yes Matthews, Cody, DO  PROAIR HFA 108 (90 Base) MCG/ACT inhaler INHALE 2 PUFFS BY MOUTH EVERY 6 HOURS AS NEEDED Patient taking differently: Inhale 2 puffs into the lungs every 6 (six) hours as needed for wheezing or shortness of breath. INHALE 2 PUFFS BY MOUTH EVERY 6 HOURS AS  NEEDED 09/03/18  Yes Ennever, Rudell Cobb, MD  aspirin EC 325 MG tablet Take 1 tablet (325 mg total) by mouth 2 (two) times daily. Patient not taking: Reported on 09/14/2018 02/20/17   Leighton Parody, PA-C  ergocalciferol (VITAMIN D2) 50000 units capsule Take 1 capsule (50,000 Units total) by mouth once a week. Patient not taking: Reported on 09/14/2018 08/02/18   [place], Holli Humbles, NP  ondansetron (ZOFRAN) 4 MG tablet Take 1 tablet (4 mg total) by mouth every 8 (eight) hours as needed for nausea or vomiting. 07/11/18   Luetta Nutting, DO    Family History Family History  Problem Relation Age of Onset  . Coronary artery disease Father 20       CABG  . Cancer Father   . Hypertension Mother   . Cancer Mother     Social History Social History   Tobacco Use  . Smoking status: Current Every Day Smoker    Packs/day: 0.50    Years: 28.00    Pack years: 14.00    Types: Cigarettes    Start date: 03/01/1988  . Smokeless tobacco: Never Used  Substance Use Topics  . Alcohol use: Yes    Alcohol/week: 12.0 standard drinks    Types: 12 Cans of beer per week  . Drug use: No     Allergies   Bee venom   Review of Systems Review of Systems  Constitutional:       Per HPI, otherwise negative  HENT:       Per HPI, otherwise negative  Respiratory:       Per HPI, otherwise negative  Cardiovascular:       Per HPI, otherwise negative  Gastrointestinal: Positive for nausea and vomiting.  Endocrine:       Negative aside from HPI  Genitourinary:       Neg aside from HPI   Musculoskeletal:       Per HPI, otherwise negative  Skin: Negative.   Neurological: Positive for weakness. Negative for syncope.     Physical Exam Updated Vital Signs BP 122/83   Pulse (!) 128   Temp 97.9 F (36.6 C) (Oral)   Resp 18   Ht 5\' 2"  (1.575 m)   Wt 63.5 kg   SpO2 100%   BMI 25.61 kg/m   Physical Exam  Constitutional: She is oriented to person, place, and time. She has a sickly appearance. No  distress.  HENT:  Head: Normocephalic and atraumatic.  Edentulous  Eyes: Conjunctivae and EOM are normal.  Cardiovascular: Regular rhythm. Tachycardia present.  Pulmonary/Chest: Effort normal and breath sounds normal. No stridor. No respiratory distress.  Abdominal: She exhibits no distension.    Musculoskeletal: She exhibits no edema.  Neurological: She is alert and oriented to person, place, and time. No cranial nerve deficit.  Skin: Skin is warm and dry.  Psychiatric: She  has a normal mood and affect.  Nursing note and vitals reviewed.    ED Treatments / Results  Labs (all labs ordered are listed, but only abnormal results are displayed) Labs Reviewed  COMPREHENSIVE METABOLIC PANEL - Abnormal; Notable for the following components:      Result Value   CO2 19 (*)    AST 73 (*)    ALT 49 (*)    Total Bilirubin 1.4 (*)    Anion gap 21 (*)    All other components within normal limits  CBC WITH DIFFERENTIAL/PLATELET - Abnormal; Notable for the following components:   Hemoglobin 15.3 (*)    MCV 104.2 (*)    MCH 35.7 (*)    Monocytes Absolute 1.8 (*)    All other components within normal limits  URINALYSIS, ROUTINE W REFLEX MICROSCOPIC - Abnormal; Notable for the following components:   Ketones, ur 80 (*)    Protein, ur 30 (*)    Leukocytes, UA TRACE (*)    Bacteria, UA FEW (*)    All other components within normal limits    EKG None  Radiology US Abdomen Limited  Result Date: 09/14/2018 CLINICAL DATA:  52 year old female with abdominal pain. Elevated LFTs. EXAM: ULTRASOUND ABDOMEN LIMITED RIGHT UPPER QUADRANT COMPARISON:  CT of the abdomen pelvis dated 10/03/2016 FINDINGS: Gallbladder: No gallstones or wall thickening visualized. No sonographic Murphy sign noted by sonographer. Common bile duct: Diameter: 5 mm Liver: Diffuse increased liver echogenicity most consistent with fatty infiltration. Superimposed fibrosis or inflammation not excluded. Portal vein is patent on  color Doppler imaging with normal direction of blood flow towards the liver. IMPRESSION: Fatty liver otherwise unremarkable abdominal ultrasound. Electronically Signed   By: Anner Crete M.D.   On: 09/14/2018 23:39    Procedures Procedures (including critical care time)  Medications Ordered in ED Medications  sodium chloride 0.9 % bolus 1,000 mL (0 mLs Intravenous Stopped 09/14/18 1917)  ondansetron (ZOFRAN) injection 4 mg (4 mg Intravenous Given 09/14/18 1815)  ondansetron (ZOFRAN) injection 4 mg (4 mg Intravenous Given 09/14/18 2032)  sodium chloride 0.9 % bolus 1,000 mL (0 mLs Intravenous Stopped 09/14/18 2155)     Initial Impression / Assessment and Plan / ED Course  I have reviewed the triage vital signs and the nursing notes.  Pertinent labs & imaging results that were available during my care of the patient were reviewed by me and considered in my medical decision making (see chart for details).     12:09 AM Patient's heart rate now 105, she feels better, looks better We discussed all findings including evidence largely for dehydration, no evidence for bacteremia, sepsis, substantial hepatobiliary dysfunction. Ultrasound also reassuring. Patient is tolerant of oral, will go home with continued oral resuscitation, and outpatient follow-up.   Final Clinical Impressions(s) / ED Diagnoses  Nausea Dehydration Tachycardia   Carmin Muskrat, MD 09/15/18 0009

## 2018-09-14 NOTE — ED Notes (Signed)
Urine culture sent down with urinalysis.  

## 2018-09-15 MED ORDER — ONDANSETRON 4 MG PO TBDP: 4.0000 mg | ORAL_TABLET | Freq: Three times a day (TID) | ORAL | 0 refills | Status: DC | PRN

## 2018-09-15 NOTE — Discharge Instructions (Signed)
As discussed, your evaluation today has been largely reassuring.  But, it is important that you monitor your condition carefully, and do not hesitate to return to the ED if you develop new, or concerning changes in your condition.  These be sure to drink plenty of fluids and follow-up with your physician for further evaluation and management.

## 2018-09-25 ENCOUNTER — Encounter (HOSPITAL_COMMUNITY): Payer: Self-pay | Admitting: Internal Medicine

## 2018-09-25 ENCOUNTER — Emergency Department (HOSPITAL_COMMUNITY): Payer: Medicare Other

## 2018-09-25 ENCOUNTER — Other Ambulatory Visit: Payer: Self-pay

## 2018-09-25 ENCOUNTER — Ambulatory Visit: Payer: Self-pay | Admitting: Family Medicine

## 2018-09-25 ENCOUNTER — Inpatient Hospital Stay (HOSPITAL_COMMUNITY)
Admission: EM | Admit: 2018-09-25 | Discharge: 2018-09-29 | DRG: 641 | Disposition: A | Discharge status: Home Health | Payer: Medicare Other | Attending: Internal Medicine | Admitting: Internal Medicine

## 2018-09-25 DIAGNOSIS — E8889 Other specified metabolic disorders: Secondary | ICD-10-CM | POA: Diagnosis not present

## 2018-09-25 DIAGNOSIS — Z79899 Other long term (current) drug therapy: Secondary | ICD-10-CM

## 2018-09-25 DIAGNOSIS — E512 Wernicke's encephalopathy: Secondary | ICD-10-CM | POA: Diagnosis not present

## 2018-09-25 DIAGNOSIS — E876 Hypokalemia: Secondary | ICD-10-CM

## 2018-09-25 DIAGNOSIS — I10 Essential (primary) hypertension: Secondary | ICD-10-CM | POA: Diagnosis not present

## 2018-09-25 DIAGNOSIS — H538 Other visual disturbances: Secondary | ICD-10-CM | POA: Diagnosis not present

## 2018-09-25 DIAGNOSIS — K219 Gastro-esophageal reflux disease without esophagitis: Secondary | ICD-10-CM | POA: Diagnosis present

## 2018-09-25 DIAGNOSIS — Z87442 Personal history of urinary calculi: Secondary | ICD-10-CM

## 2018-09-25 DIAGNOSIS — H5500 Unspecified nystagmus: Secondary | ICD-10-CM | POA: Diagnosis not present

## 2018-09-25 DIAGNOSIS — R630 Anorexia: Secondary | ICD-10-CM | POA: Diagnosis present

## 2018-09-25 DIAGNOSIS — X58XXXA Exposure to other specified factors, initial encounter: Secondary | ICD-10-CM | POA: Diagnosis present

## 2018-09-25 DIAGNOSIS — E872 Acidosis, unspecified: Secondary | ICD-10-CM | POA: Diagnosis present

## 2018-09-25 DIAGNOSIS — T730XXA Starvation, initial encounter: Secondary | ICD-10-CM | POA: Diagnosis present

## 2018-09-25 DIAGNOSIS — R011 Cardiac murmur, unspecified: Secondary | ICD-10-CM | POA: Diagnosis present

## 2018-09-25 DIAGNOSIS — Z96651 Presence of right artificial knee joint: Secondary | ICD-10-CM | POA: Diagnosis not present

## 2018-09-25 DIAGNOSIS — R0789 Other chest pain: Secondary | ICD-10-CM | POA: Diagnosis not present

## 2018-09-25 DIAGNOSIS — Z720 Tobacco use: Secondary | ICD-10-CM | POA: Diagnosis not present

## 2018-09-25 DIAGNOSIS — R404 Transient alteration of awareness: Secondary | ICD-10-CM | POA: Diagnosis not present

## 2018-09-25 DIAGNOSIS — F1010 Alcohol abuse, uncomplicated: Secondary | ICD-10-CM | POA: Diagnosis present

## 2018-09-25 DIAGNOSIS — R509 Fever, unspecified: Secondary | ICD-10-CM | POA: Diagnosis not present

## 2018-09-25 DIAGNOSIS — J449 Chronic obstructive pulmonary disease, unspecified: Secondary | ICD-10-CM | POA: Diagnosis present

## 2018-09-25 DIAGNOSIS — G9340 Encephalopathy, unspecified: Secondary | ICD-10-CM

## 2018-09-25 DIAGNOSIS — E46 Unspecified protein-calorie malnutrition: Secondary | ICD-10-CM | POA: Diagnosis not present

## 2018-09-25 DIAGNOSIS — R6510 Systemic inflammatory response syndrome (SIRS) of non-infectious origin without acute organ dysfunction: Secondary | ICD-10-CM | POA: Diagnosis present

## 2018-09-25 DIAGNOSIS — F17210 Nicotine dependence, cigarettes, uncomplicated: Secondary | ICD-10-CM | POA: Diagnosis present

## 2018-09-25 DIAGNOSIS — Z8249 Family history of ischemic heart disease and other diseases of the circulatory system: Secondary | ICD-10-CM

## 2018-09-25 DIAGNOSIS — R278 Other lack of coordination: Secondary | ICD-10-CM | POA: Diagnosis not present

## 2018-09-25 DIAGNOSIS — R079 Chest pain, unspecified: Secondary | ICD-10-CM | POA: Diagnosis not present

## 2018-09-25 DIAGNOSIS — E860 Dehydration: Secondary | ICD-10-CM | POA: Diagnosis present

## 2018-09-25 DIAGNOSIS — F419 Anxiety disorder, unspecified: Secondary | ICD-10-CM | POA: Diagnosis present

## 2018-09-25 DIAGNOSIS — A419 Sepsis, unspecified organism: Secondary | ICD-10-CM | POA: Diagnosis present

## 2018-09-25 DIAGNOSIS — Z90710 Acquired absence of both cervix and uterus: Secondary | ICD-10-CM

## 2018-09-25 DIAGNOSIS — Z8701 Personal history of pneumonia (recurrent): Secondary | ICD-10-CM

## 2018-09-25 DIAGNOSIS — H499 Unspecified paralytic strabismus: Secondary | ICD-10-CM | POA: Diagnosis present

## 2018-09-25 DIAGNOSIS — R4182 Altered mental status, unspecified: Secondary | ICD-10-CM | POA: Diagnosis present

## 2018-09-25 DIAGNOSIS — R4020 Unspecified coma: Secondary | ICD-10-CM | POA: Diagnosis not present

## 2018-09-25 DIAGNOSIS — R413 Other amnesia: Secondary | ICD-10-CM | POA: Diagnosis not present

## 2018-09-25 DIAGNOSIS — H5509 Other forms of nystagmus: Secondary | ICD-10-CM | POA: Diagnosis not present

## 2018-09-25 DIAGNOSIS — Z86718 Personal history of other venous thrombosis and embolism: Secondary | ICD-10-CM

## 2018-09-25 DIAGNOSIS — R000 Tachycardia, unspecified: Secondary | ICD-10-CM | POA: Diagnosis not present

## 2018-09-25 DIAGNOSIS — Z6825 Body mass index (BMI) 25.0-25.9, adult: Secondary | ICD-10-CM

## 2018-09-25 DIAGNOSIS — R05 Cough: Secondary | ICD-10-CM | POA: Diagnosis not present

## 2018-09-25 LAB — CSF CELL COUNT WITH DIFFERENTIAL
RBC Count, CSF: 1 /mm3 — ABNORMAL HIGH
TUBE #: 1
WBC, CSF: 1 /mm3 (ref 0–5)

## 2018-09-25 LAB — COMPREHENSIVE METABOLIC PANEL
ALT: 40 U/L (ref 0–44)
ANION GAP: 17 — AB (ref 5–15)
AST: 36 U/L (ref 15–41)
Albumin: 3.5 g/dL (ref 3.5–5.0)
Alkaline Phosphatase: 75 U/L (ref 38–126)
BUN: 14 mg/dL (ref 6–20)
CHLORIDE: 106 mmol/L (ref 98–111)
CO2: 16 mmol/L — AB (ref 22–32)
Calcium: 9.3 mg/dL (ref 8.9–10.3)
Creatinine, Ser: 0.9 mg/dL (ref 0.44–1.00)
Glucose, Bld: 117 mg/dL — ABNORMAL HIGH (ref 70–99)
Potassium: 2.9 mmol/L — ABNORMAL LOW (ref 3.5–5.1)
SODIUM: 139 mmol/L (ref 135–145)
Total Bilirubin: 1.1 mg/dL (ref 0.3–1.2)
Total Protein: 6.7 g/dL (ref 6.5–8.1)

## 2018-09-25 LAB — CBC WITH DIFFERENTIAL/PLATELET
ABS IMMATURE GRANULOCYTES: 0.06 10*3/uL (ref 0.00–0.07)
BASOS PCT: 1 %
Basophils Absolute: 0.1 10*3/uL (ref 0.0–0.1)
EOS ABS: 0 10*3/uL (ref 0.0–0.5)
Eosinophils Relative: 0 %
HEMATOCRIT: 43.4 % (ref 36.0–46.0)
Hemoglobin: 15.3 g/dL — ABNORMAL HIGH (ref 12.0–15.0)
IMMATURE GRANULOCYTES: 1 %
LYMPHS ABS: 1.6 10*3/uL (ref 0.7–4.0)
Lymphocytes Relative: 18 %
MCH: 34.9 pg — AB (ref 26.0–34.0)
MCHC: 35.3 g/dL (ref 30.0–36.0)
MCV: 99.1 fL (ref 80.0–100.0)
MONO ABS: 1.9 10*3/uL — AB (ref 0.1–1.0)
MONOS PCT: 21 %
NEUTROS ABS: 5.2 10*3/uL (ref 1.7–7.7)
NEUTROS PCT: 59 %
PLATELETS: 143 10*3/uL — AB (ref 150–400)
RBC: 4.38 MIL/uL (ref 3.87–5.11)
RDW: 12.7 % (ref 11.5–15.5)
WBC: 8.9 10*3/uL (ref 4.0–10.5)
nRBC: 0.2 % (ref 0.0–0.2)

## 2018-09-25 LAB — PROCALCITONIN: Procalcitonin: <0.1 ng/mL

## 2018-09-25 LAB — CBG MONITORING, ED: Glucose-Capillary: 123 mg/dL — ABNORMAL HIGH (ref 70–99)

## 2018-09-25 LAB — PROTIME-INR
INR: 1.08
PROTHROMBIN TIME: 13.9 s (ref 11.4–15.2)

## 2018-09-25 LAB — I-STAT CHEM 8, ED
BUN: 15 mg/dL (ref 6–20)
CHLORIDE: 107 mmol/L (ref 98–111)
Calcium, Ion: 1.17 mmol/L (ref 1.15–1.40)
Creatinine, Ser: 0.4 mg/dL — ABNORMAL LOW (ref 0.44–1.00)
Glucose, Bld: 113 mg/dL — ABNORMAL HIGH (ref 70–99)
HEMATOCRIT: 45 % (ref 36.0–46.0)
HEMOGLOBIN: 15.3 g/dL — AB (ref 12.0–15.0)
POTASSIUM: 2.8 mmol/L — AB (ref 3.5–5.1)
SODIUM: 139 mmol/L (ref 135–145)
TCO2: 15 mmol/L — ABNORMAL LOW (ref 22–32)

## 2018-09-25 LAB — I-STAT CG4 LACTIC ACID, ED
LACTIC ACID, VENOUS: 2.53 mmol/L — AB (ref 0.5–1.9)
Lactic Acid, Venous: 0.83 mmol/L (ref 0.5–1.9)

## 2018-09-25 LAB — ETHANOL

## 2018-09-25 LAB — CRYPTOCOCCAL ANTIGEN, CSF: CRYPTO AG: NEGATIVE

## 2018-09-25 LAB — PROTEIN, CSF: Total  Protein, CSF: 46 mg/dL — ABNORMAL HIGH (ref 15–45)

## 2018-09-25 LAB — LACTIC ACID, PLASMA
LACTIC ACID, VENOUS: 1.1 mmol/L (ref 0.5–1.9)
Lactic Acid, Venous: 1.1 mmol/L (ref 0.5–1.9)

## 2018-09-25 LAB — APTT: APTT: 23 s — AB (ref 24–36)

## 2018-09-25 LAB — I-STAT BETA HCG BLOOD, ED (MC, WL, AP ONLY)

## 2018-09-25 LAB — TSH: TSH: 0.996 u[IU]/mL (ref 0.350–4.500)

## 2018-09-25 LAB — GLUCOSE, CSF: Glucose, CSF: 67 mg/dL (ref 40–70)

## 2018-09-25 LAB — I-STAT TROPONIN, ED: TROPONIN I, POC: 0.01 ng/mL (ref 0.00–0.08)

## 2018-09-25 MED ORDER — ALBUTEROL SULFATE (2.5 MG/3ML) 0.083% IN NEBU: 2.5000 mg | INHALATION_SOLUTION | RESPIRATORY_TRACT | Status: DC | PRN

## 2018-09-25 MED ORDER — DOCUSATE SODIUM 100 MG PO CAPS
100.0000 mg | ORAL_CAPSULE | Freq: Two times a day (BID) | ORAL | Status: DC
  Administered 2018-09-25 – 2018-09-29 (×7): 100 mg via ORAL
  Filled 2018-09-25 (×8): qty 1

## 2018-09-25 MED ORDER — ONDANSETRON HCL 4 MG PO TABS: 4.0000 mg | ORAL_TABLET | Freq: Four times a day (QID) | ORAL | Status: DC | PRN

## 2018-09-25 MED ORDER — SODIUM CHLORIDE 0.9% FLUSH
3.0000 mL | Freq: Two times a day (BID) | INTRAVENOUS | Status: DC
  Administered 2018-09-28 (×2): 3 mL via INTRAVENOUS

## 2018-09-25 MED ORDER — VANCOMYCIN HCL IN DEXTROSE 1-5 GM/200ML-% IV SOLN
1000.0000 mg | Freq: Once | INTRAVENOUS | Status: AC
  Administered 2018-09-25: 1000 mg via INTRAVENOUS
  Filled 2018-09-25: qty 200

## 2018-09-25 MED ORDER — LORAZEPAM 2 MG/ML IJ SOLN
1.0000 mg | Freq: Four times a day (QID) | INTRAMUSCULAR | Status: AC | PRN
  Administered 2018-09-26: 1 mg via INTRAVENOUS
  Filled 2018-09-25: qty 1

## 2018-09-25 MED ORDER — LORAZEPAM 1 MG PO TABS: 1.0000 mg | ORAL_TABLET | Freq: Four times a day (QID) | ORAL | Status: AC | PRN

## 2018-09-25 MED ORDER — ACETAMINOPHEN 325 MG PO TABS
650.0000 mg | ORAL_TABLET | Freq: Four times a day (QID) | ORAL | Status: DC | PRN
  Administered 2018-09-25 – 2018-09-28 (×8): 650 mg via ORAL
  Filled 2018-09-25 (×8): qty 2

## 2018-09-25 MED ORDER — NICOTINE 14 MG/24HR TD PT24
14.0000 mg | MEDICATED_PATCH | Freq: Every day | TRANSDERMAL | Status: DC
  Administered 2018-09-25 – 2018-09-29 (×5): 14 mg via TRANSDERMAL
  Filled 2018-09-25 (×5): qty 1

## 2018-09-25 MED ORDER — ONDANSETRON HCL 4 MG/2ML IJ SOLN
4.0000 mg | Freq: Four times a day (QID) | INTRAMUSCULAR | Status: DC | PRN
  Administered 2018-09-26: 4 mg via INTRAVENOUS
  Filled 2018-09-25: qty 2

## 2018-09-25 MED ORDER — FOLIC ACID 1 MG PO TABS
1.0000 mg | ORAL_TABLET | Freq: Every day | ORAL | Status: DC
  Administered 2018-09-25 – 2018-09-28 (×4): 1 mg via ORAL
  Filled 2018-09-25 (×4): qty 1

## 2018-09-25 MED ORDER — SODIUM CHLORIDE 0.9 % IV SOLN
2.0000 g | Freq: Two times a day (BID) | INTRAVENOUS | Status: DC
  Administered 2018-09-25 – 2018-09-26 (×2): 2 g via INTRAVENOUS
  Filled 2018-09-25 (×2): qty 2

## 2018-09-25 MED ORDER — ACETAMINOPHEN 650 MG RE SUPP: 650.0000 mg | Freq: Four times a day (QID) | RECTAL | Status: DC | PRN

## 2018-09-25 MED ORDER — LACTATED RINGERS IV SOLN
INTRAVENOUS | Status: DC
  Administered 2018-09-25 – 2018-09-26 (×4): via INTRAVENOUS

## 2018-09-25 MED ORDER — SODIUM CHLORIDE 0.9 % IV BOLUS
2000.0000 mL | Freq: Once | INTRAVENOUS | Status: AC
  Administered 2018-09-25: 2000 mL via INTRAVENOUS

## 2018-09-25 MED ORDER — PIPERACILLIN-TAZOBACTAM 3.375 G IVPB 30 MIN
3.3750 g | Freq: Once | INTRAVENOUS | Status: AC
  Administered 2018-09-25: 3.375 g via INTRAVENOUS
  Filled 2018-09-25: qty 50

## 2018-09-25 MED ORDER — VANCOMYCIN HCL IN DEXTROSE 750-5 MG/150ML-% IV SOLN
750.0000 mg | Freq: Two times a day (BID) | INTRAVENOUS | Status: DC
  Administered 2018-09-26: 750 mg via INTRAVENOUS
  Filled 2018-09-25 (×2): qty 150

## 2018-09-25 MED ORDER — ENSURE ENLIVE PO LIQD
237.0000 mL | Freq: Two times a day (BID) | ORAL | Status: DC
  Administered 2018-09-25 – 2018-09-26 (×2): 237 mL via ORAL

## 2018-09-25 MED ORDER — ENOXAPARIN SODIUM 40 MG/0.4ML ~~LOC~~ SOLN
40.0000 mg | SUBCUTANEOUS | Status: DC
  Administered 2018-09-25 – 2018-09-28 (×4): 40 mg via SUBCUTANEOUS
  Filled 2018-09-25 (×4): qty 0.4

## 2018-09-25 MED ORDER — THIAMINE HCL 100 MG/ML IJ SOLN
500.0000 mg | Freq: Two times a day (BID) | INTRAVENOUS | Status: AC
  Administered 2018-09-25 – 2018-09-27 (×6): 500 mg via INTRAVENOUS
  Filled 2018-09-25: qty 3
  Filled 2018-09-25 (×5): qty 5

## 2018-09-25 MED ORDER — THIAMINE HCL 100 MG/ML IJ SOLN: 100.0000 mg | Freq: Every day | INTRAMUSCULAR | Status: DC

## 2018-09-25 MED ORDER — AMLODIPINE BESYLATE 5 MG PO TABS
5.0000 mg | ORAL_TABLET | Freq: Every day | ORAL | Status: DC
  Administered 2018-09-26 – 2018-09-29 (×4): 5 mg via ORAL
  Filled 2018-09-25 (×4): qty 1

## 2018-09-25 MED ORDER — METRONIDAZOLE IN NACL 5-0.79 MG/ML-% IV SOLN
500.0000 mg | Freq: Three times a day (TID) | INTRAVENOUS | Status: DC
  Administered 2018-09-25 – 2018-09-26 (×3): 500 mg via INTRAVENOUS
  Filled 2018-09-25 (×3): qty 100

## 2018-09-25 MED ORDER — MAGNESIUM SULFATE 2 GM/50ML IV SOLN
2.0000 g | INTRAVENOUS | Status: AC
  Administered 2018-09-25: 2 g via INTRAVENOUS
  Filled 2018-09-25: qty 50

## 2018-09-25 MED ORDER — ADULT MULTIVITAMIN W/MINERALS CH
1.0000 | ORAL_TABLET | Freq: Every day | ORAL | Status: DC
  Administered 2018-09-25 – 2018-09-29 (×5): 1 via ORAL
  Filled 2018-09-25 (×5): qty 1

## 2018-09-25 MED ORDER — POTASSIUM CHLORIDE 10 MEQ/100ML IV SOLN
10.0000 meq | Freq: Once | INTRAVENOUS | Status: AC
  Administered 2018-09-25: 10 meq via INTRAVENOUS
  Filled 2018-09-25: qty 100

## 2018-09-25 NOTE — Progress Notes (Signed)
Pharmacy Antibiotic Note  Wendy Edwards is a 52 y.o. female admitted on 09/25/2018 with infection of unknown source.  Pharmacy has been consulted for vancomycin and cefepime dosing. Pt has initially received a dose of vancomycin and Zosyn. Zosyn is now being changed to cefepime. Metronidazole per MD. WBC - 8.9, Tmax - 100.1, lactic acid - 2.53, Scr- 0.4  Plan: -Vancomycin 1g x1, then 750mg  q12h -Cefepime 2g q12h -Metronidazole 500mg  q8h per MD  Height: 5\' 2"  (157.5 cm) Weight: 138 lb 14.2 oz (63 kg) IBW/kg (Calculated) : 50.1  Temp (24hrs), Avg:100.1 F (37.8 C), Min:100.1 F (37.8 C), Max:100.1 F (37.8 C)  Recent Labs  Lab 09/25/18 1148 09/25/18 1159 09/25/18 1200 09/25/18 1432  WBC 8.9  --   --   --   CREATININE 0.90  --  0.40*  --   LATICACIDVEN  --  2.53*  --  0.83    Estimated Creatinine Clearance: 73.4 mL/min (A) (by C-G formula based on SCr of 0.4 mg/dL (L)).    Allergies  Allergen Reactions  . Bee Venom Hives    Antimicrobials this admission: Zosyn 10/8 x1 Metronidazole 10/8 >> Vancomycin 10/8 >>  Cefepime 10/9 >>   Dose adjustments this admission: N/A  Microbiology results: pending  Thank you for allowing pharmacy to be a part of this patient's care.  Tyson Babinski 09/25/2018 3:12 PM

## 2018-09-25 NOTE — Consult Note (Addendum)
Neurology Consultation  Reason for Consult: Dr. Sabra Heck Referring Physician: Silvio Clayman nystagmus  CC: Shortness of breath and blurred vision  History is obtained from: Patient who is a poor historian  HPI: Wendy Edwards is a 52 y.o. female with history of hypertension, depression, COPD, daily headaches, aplastic anemia, DVT in the past.  Patient was brought in by EMS after her son noted that she is not acting normal. ED course CT of head was obtained and neurology was consulted.  Unfortunately, patient is a poor historian.  What I can gather is that the son as stated above was noticeably worried about her altered mental status.  He also noticed her eyes were acting differently.  Patient does state that she has blurred vision.  And during exam it is very noticeable that she has vertical nystagmus.  She also has bilateral lateral ophthalmoplegia and ataxia.  It was attempted to call the son to find out further information about his mother's home life, history of drinking, malnutrition no or nutritional deficits however this was unavailable.  A number was left for him to call.  Of note son's name is Wendy Edwards and the number is [phone]  Son eventually was able to be contacted.  He does state that 1 year ago her teeth were pulled, she was supposed to get dentures but never did.  Over the past year she has not been eating or drinking.  She has been very immobile only transferring from a toilet chair to the bed or a chair.  In addition he has noted the vertical nystagmus which has been worsening over the past year along with memory problems, hallucinations.  The reason that he states she was brought to the hospital is secondary to worsening of memory and hallucinations.   Chart review: No prior neurology notes, of note patient was recently here on 09-14-2018, apparently at that time she has had teeth removed.  She had difficult maintaining her weight and had 3 to 4 days of persistent anorexia.  At that time  patient had arrived at the ED secondary to chronic emesis  LKW: Unknown as patient is a poor historian however she does state at one point that this is been going on for approximately 1 year  ROS: Unable to obtain due to altered mental status.   Past Medical History:  Diagnosis Date  . 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 Aplastic anemia ([place])    hx/notes 05/09/2007  . Arthritis    "right knee" (02/21/2017)  . Asthma   . Chronic anxiety    Archie Endo 02/04/2016  . COPD (chronic obstructive pulmonary disease) ([place])    hx/notes 05/09/2007  . Daily headache   . Depression   . DVT (deep venous thrombosis) ([facility])    hx upper and lower extremeties/notes 05/09/2007  . GERD (gastroesophageal reflux disease)   . Heart murmur 1974  . History of blood transfusion    "related to my aplastic anemia"  . History of kidney stones   . Hypertension   . Migraine    "a few/year" (02/21/2017)  . Pneumonia 1974   hospitalized "w/double pneumonia"  . Serum sickness    hx/notes 05/09/2007  . Transaminitis    asymptomatic hx/notes 05/09/2007     Family History  Problem Relation Age of Onset  . Coronary artery disease Father 82       CABG  . Cancer Father   . Hypertension Mother   .  Cancer Mother      Social History:   reports that she has been smoking cigarettes. She started smoking about 30 years ago. She has a 14.00 pack-year smoking history. She has never used smokeless tobacco. She reports that she drinks about 12.0 standard drinks of alcohol per week. She reports that she does not use drugs.  Medications  Current Facility-Administered Medications:  .  piperacillin-tazobactam (ZOSYN) IVPB 3.375 g, 3.375 g, Intravenous, Once, Noemi Chapel, MD .  sodium chloride 0.9 % bolus 2,000 mL, 2,000 mL, Intravenous, Once, Noemi Chapel, MD .  vancomycin (VANCOCIN) IVPB 1000 mg/200 mL premix, 1,000 mg, Intravenous, Once,  Noemi Chapel, MD  Current Outpatient Medications:  .  albuterol (ACCUNEB) 1.25 MG/3ML nebulizer solution, TAKE 3 ML(1 VIAL) BY NEBULIZATION 3 TIMES DAILY AS NEEDED FOR WHEEZING. (Patient taking differently: Take 1 ampule by nebulization every 6 (six) hours as needed for wheezing or shortness of breath. ), Disp: 75 mL, Rfl: 1 .  ALPRAZolam (XANAX) 1 MG tablet, TAKE 1 TABLET BY MOUTH THREE TIMES A DAY AS NEEDED (Patient taking differently: Take 1 mg by mouth 2 (two) times daily as needed for anxiety. ), Disp: 90 tablet, Rfl: 2 .  amLODipine (NORVASC) 5 MG tablet, Take 1 tablet (5 mg total) by mouth daily., Disp: 90 tablet, Rfl: 3 .  ergocalciferol (VITAMIN D2) 50000 units capsule, Take 1 capsule (50,000 Units total) by mouth once a week., Disp: 8 capsule, Rfl: 6 .  PROAIR HFA 108 (90 Base) MCG/ACT inhaler, INHALE 2 PUFFS BY MOUTH EVERY 6 HOURS AS NEEDED (Patient taking differently: Inhale 2 puffs into the lungs every 6 (six) hours as needed for wheezing or shortness of breath. INHALE 2 PUFFS BY MOUTH EVERY 6 HOURS AS NEEDED), Disp: 8.5 Inhaler, Rfl: 0 .  aspirin EC 325 MG tablet, Take 1 tablet (325 mg total) by mouth 2 (two) times daily. (Patient not taking: Reported on 09/14/2018), Disp: 30 tablet, Rfl: 0 .  ondansetron (ZOFRAN ODT) 4 MG disintegrating tablet, Take 1 tablet (4 mg total) by mouth every 8 (eight) hours as needed for nausea or vomiting. (Patient not taking: Reported on 09/25/2018), Disp: 20 tablet, Rfl: 0 .  ondansetron (ZOFRAN) 4 MG tablet, Take 1 tablet (4 mg total) by mouth every 8 (eight) hours as needed for nausea or vomiting. (Patient not taking: Reported on 09/25/2018), Disp: 20 tablet, Rfl: 0   Exam: Current vital signs: BP (!) 147/91   Pulse (!) 130   Temp 100.1 F (37.8 C) (Rectal)   Resp 18   Ht 5\' 2"  (1.575 m)   Wt 63 kg   SpO2 98%   BMI 25.40 kg/m  Vital signs in last 24 hours: Temp:  [100.1 F (37.8 C)] 100.1 F (37.8 C) (10/08 1133) Pulse Rate:  [130] 130 (10/08  1133) Resp:  [18] 18 (10/08 1133) BP: (147)/(91) 147/91 (10/08 1133) SpO2:  [98 %] 98 % (10/08 1133) Weight:  [09 kg] 63 kg (10/08 1134)  Physical Exam  Constitutional: Appears well-developed in the upper extremities however lower extremities appear cachectic Psych: Bradyphrenic-confused Eyes: No scleral injection HENT: No OP obstrucion Head: Normocephalic.  Cardiovascular: Normal rate and regular rhythm.  Respiratory: Mild labored breathing GI: Soft.  No distension. There is no tenderness.  Skin: WDI  Neuro: Mental Status: Patient is awake, she is alert to hospital and knows her address Patient is able not able to give a clear and coherent history No signs of aphasia or neglect Cranial Nerves: II: Visual  Fields are full. Pupils are equal, round, and reactive to light.   III,IV, VI: Bilateral lateral ophthalmoplegia along with vertical nystagmus worse with upgaze than when looking down V: Facial sensation is symmetric to temperature VII: Facial movement is symmetric.  VIII: hearing is intact to voice X: Uvula elevates symmetrically XI: Shoulder shrug is symmetric. XII: tongue is midline without atrophy or fasciculations.  Motor: Tone is normal. Bulk is normal. 5/5 strength was present in all four extremities.  She has minimal muscle mass in her calfs and her feet Sensory: Sensation is symmetric with lower extremity decreased sensation to cold and vibration up to knees Deep Tendon Reflexes: No deep tendon reflexes at the ankle or knee jerk with 1+ at the biceps Plantars: Toes are downgoing bilaterally.  Cerebellar: Patient does show an point dysmetria with bilateral upper extremities and dysmetria of heel-to-shin     Labs I have reviewed labs in epic and the results pertinent to this consultation are:   CBC    Component Value Date/Time   WBC 8.9 09/25/2018 1148   RBC 4.38 09/25/2018 1148   HGB 15.3 (H) 09/25/2018 1200   HGB 14.5 08/01/2018 1406   HGB 14.2  12/22/2017 1436   HGB 15.8 02/20/2008 0813   HCT 45.0 09/25/2018 1200   HCT 41.4 12/22/2017 1436   HCT 45.9 02/20/2008 0813   PLT 143 (L) 09/25/2018 1148   PLT 113 (L) 08/01/2018 1406   PLT 164 12/22/2017 1436   PLT 175 02/20/2008 0813   MCV 99.1 09/25/2018 1148   MCV 99 12/22/2017 1436   MCV 101.3 (H) 02/20/2008 0813   MCH 34.9 (H) 09/25/2018 1148   MCHC 35.3 09/25/2018 1148   RDW 12.7 09/25/2018 1148   RDW 12.6 12/22/2017 1436   RDW 13.6 02/20/2008 0813   LYMPHSABS 1.6 09/25/2018 1148   LYMPHSABS 3.2 12/22/2017 1436   LYMPHSABS 2.1 02/20/2008 0813   MONOABS 1.9 (H) 09/25/2018 1148   MONOABS 0.6 02/20/2008 0813   EOSABS 0.0 09/25/2018 1148   EOSABS 0.1 12/22/2017 1436   BASOSABS 0.1 09/25/2018 1148   BASOSABS 0.0 12/22/2017 1436   BASOSABS 0.0 02/20/2008 0813    CMP     Component Value Date/Time   NA 139 09/25/2018 1200   NA 142 12/22/2017 1436   NA 137 03/24/2017 1113   K 2.8 (L) 09/25/2018 1200   K 3.9 12/22/2017 1436   K 4.1 03/24/2017 1113   CL 107 09/25/2018 1200   CL 105 12/22/2017 1436   CO2 19 (L) 09/14/2018 1814   CO2 27 12/22/2017 1436   CO2 23 03/24/2017 1113   GLUCOSE 113 (H) 09/25/2018 1200   GLUCOSE 104 12/22/2017 1436   BUN 15 09/25/2018 1200   BUN 6 (L) 12/22/2017 1436   BUN 11.2 03/24/2017 1113   CREATININE 0.40 (L) 09/25/2018 1200   CREATININE 0.60 08/01/2018 1406   CREATININE 0.6 12/22/2017 1436   CREATININE 0.7 03/24/2017 1113   CALCIUM 9.2 09/14/2018 1814   CALCIUM 9.7 12/22/2017 1436   CALCIUM 9.8 03/24/2017 1113   PROT 6.8 09/14/2018 1814   PROT 6.9 12/22/2017 1436   PROT 7.6 03/24/2017 1113   ALBUMIN 3.6 09/14/2018 1814   ALBUMIN 3.4 12/22/2017 1436   ALBUMIN 4.2 03/24/2017 1113   AST 73 (H) 09/14/2018 1814   AST 111 (H) 08/01/2018 1406   AST 13 03/24/2017 1113   ALT 49 (H) 09/14/2018 1814   ALT 63 (H) 08/01/2018 1406   ALT 24 12/22/2017 1436  ALT 11 03/24/2017 1113   ALKPHOS 91 09/14/2018 1814   ALKPHOS 80 12/22/2017 1436    ALKPHOS 96 03/24/2017 1113   BILITOT 1.4 (H) 09/14/2018 1814   BILITOT 0.5 08/01/2018 1406   BILITOT 0.39 03/24/2017 1113   GFRNONAA >60 09/14/2018 1814   GFRAA >60 09/14/2018 1814   Imaging I have reviewed the images obtained:  CT-scan of the brain--no acute intracranial abnormalities noted  LP currently being obtained  [name] PA-C [facility] [phone]  M-F  (9:00 am- 5:00 PM)  09/25/2018, 12:43 PM   Attending addendum Agree with the history and physical documented above.  I have independently reviewed the images.  CT scan of the brain shows no acute abnormalities.  Assessment: 52 year old female who is a poor historian and brought to the hospital secondary to altered mental status and nystagmus.  She has had a progressive cognitive decline, double vision and has not been able to eat well ever since all her teeth were removed as she did not get a denture as advised. On examination she does have some short-term memory loss, ataxia, ophthalmoplegia and horizontal gaze and some nystagmus with vertical gaze. 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  Initially son was unable to be contacted and it was not known how long these deficits have been ongoing for. - Draw thiamine level and then 500 mg thiamine IV 3 times daily for 3 days followed by 100 mg thiamine daily -Also check TSH, B12, RPR, hemoglobin A1c -Nutrition consult -MRI of the brain with and without contrast -PT OT - Social work assistance   Addendum after LP Protein mildly elevated at 46, normal glucose. No  cells. Less likely infectious or autoimmune at this time. More likely nutritional deficiency as above. Recommendations as above.  -- Amie Portland, MD [facility] Pager: [phone] If 7pm to 7am, please call on call as listed on AMION.

## 2018-09-25 NOTE — H&P (Signed)
History and Physical    Wendy Edwards ZOX:[id number] DOB: 09/18/1966 DOA: 09/25/2018  PCP: Luetta Nutting, DO Consultants:  Marin Olp - oncology Patient coming from:  Home - lives with son; NOK: Evelina Bucy, [phone]  Chief Complaint:  AMS  HPI: Wendy Edwards is a 52 y.o. female with medical history significant of HTN; COPD; aplastic anemia (2008); and anxiety presenting with AMS.  The patient reports that she has been light-headed and dizzy, not able to eat.  This has been going on off/on for 2 weeks.  Denies confusion.  +headaches, some blurry vision, +cough/SOB.  Cough is nonproductive.  She reports that her usual day is in bed, getting up to go to the bathroom or to the kitchen and then back to bed.    HPI per PA Tamala Julian, who spoke with the patient's son:  Son eventually was able to be contacted. He does state that 1 year ago her teeth were pulled, she was supposed to get dentures but never did. Over the past year she has not been eating or drinking. She has been very immobile only transferring from a toilet chair to the bed or a chair. In addition he has noted the vertical nystagmus which has been worsening over the past year along with memory problems, hallucinations. The reason that he states she was brought to the hospital is secondary to worsening of memory and hallucinations.   ED Course:  Poor medical repair, malnourished and does not eat well.  Transfers bed to commode to chair and back to bed. AMS, tachycardia, T 100.1.  Vertical nystagmus, arm ataxia.  Neurology consulted.  CT negative.  CXR negative.  No source of infection.  LP pending.  Likely Wernicke's.  P down to 110s, increased lactate.  Getting Mag, K+.  Received broad-spectrum antibiotics initially due to no clear diagnosis.  Review of Systems: As per HPI; otherwise review of systems reviewed and negative.  This is suspect due to her underlying AMS.  Ambulatory Status:  Ambulates with a cane  Past Medical History:    Diagnosis Date  . 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 Aplastic anemia ([place])    hx/notes 05/09/2007  . Arthritis    "right knee" (02/21/2017)  . Asthma   . Chronic anxiety    Archie Endo 02/04/2016  . COPD (chronic obstructive pulmonary disease) ([place])    hx/notes 05/09/2007  . Daily headache   . Depression   . DVT (deep venous thrombosis) ([facility])    hx upper and lower extremeties/notes 05/09/2007  . GERD (gastroesophageal reflux disease)   . Heart murmur 1974  . History of blood transfusion    "related to my aplastic anemia"  . History of kidney stones   . Hypertension   . Migraine    "a few/year" (02/21/2017)  . Pneumonia 1974   hospitalized "w/double pneumonia"  . Serum sickness    hx/notes 05/09/2007  . Transaminitis    asymptomatic hx/notes 05/09/2007    Past Surgical History:  Procedure Laterality Date  . CYSTOSCOPY W/ URETEROSCOPY W/ LITHOTRIPSY    . EXTREMITY CYST EXCISION Left 2017   upper, inner arm  . KNEE ARTHROSCOPY Right 1985  . MEDIAL PARTIAL KNEE REPLACEMENT Right 2005  . TOTAL ABDOMINAL HYSTERECTOMY  2003   hx/notes 05/09/2007  . TOTAL KNEE REVISION Right 02/20/2017   Procedure: TOTAL KNEE REVISION;  Surgeon: Frederik Pear, MD;  Location: [facility];  Service: Orthopedics;  Laterality: Right;  .  TUBAL LIGATION    . TUNNELED VENOUS CATHETER PLACEMENT Right    hx/notes 05/09/2007; "took it out ~ 1 wk later"    Social History   Socioeconomic History  . Marital status: Legally Separated    Spouse name: Not on file  . Number of children: 3  . Years of education: Not on file  . Highest education level: Not on file  Occupational History  . Occupation: disabled    Fish farm manager: UNEMPLOYED  Social Needs  . Financial resource strain: Not on file  . Food insecurity:    Worry: Not on file    Inability: Not on file  . Transportation needs:    Medical: Not on file    Non-medical: Not on file   Tobacco Use  . Smoking status: Current Every Day Smoker    Packs/day: 0.50    Years: 34.00    Pack years: 17.00    Types: Cigarettes  . Smokeless tobacco: Never Used  Substance and Sexual Activity  . Alcohol use: Yes    Alcohol/week: 12.0 standard drinks    Types: 12 Cans of beer per week    Comment: previously a heavy drinker - 6-7 beers daily; last drank maybe a week ago  . Drug use: No  . Sexual activity: Never  Lifestyle  . Physical activity:    Days per week: Not on file    Minutes per session: Not on file  . Stress: Not on file  Relationships  . Social connections:    Talks on phone: Not on file    Gets together: Not on file    Attends religious service: Not on file    Active member of club or organization: Not on file    Attends meetings of clubs or organizations: Not on file    Relationship status: Not on file  . Intimate partner violence:    Fear of current or ex partner: Not on file    Emotionally abused: Not on file    Physically abused: Not on file    Forced sexual activity: Not on file  Other Topics Concern  . Not on file  Social History Narrative   Lives with brother.    Allergies  Allergen Reactions  . Bee Venom Hives    Family History  Problem Relation Age of Onset  . Coronary artery disease Father 25       CABG  . Cancer Father   . Hypertension Mother   . Cancer Mother     Prior to Admission medications   Medication Sig Start Date End Date Taking? Authorizing Provider  albuterol (ACCUNEB) 1.25 MG/3ML nebulizer solution TAKE 3 ML(1 VIAL) BY NEBULIZATION 3 TIMES DAILY AS NEEDED FOR WHEEZING. Patient taking differently: Take 1 ampule by nebulization every 6 (six) hours as needed for wheezing or shortness of breath.  06/01/18  Yes [place], Holli Humbles, NP  ALPRAZolam (XANAX) 1 MG tablet TAKE 1 TABLET BY MOUTH THREE TIMES A DAY AS NEEDED Patient taking differently: Take 1 mg by mouth 2 (two) times daily as needed for anxiety.  08/23/18  Yes  [place], Holli Humbles, NP  amLODipine (NORVASC) 5 MG tablet Take 1 tablet (5 mg total) by mouth daily. 07/11/18  Yes Luetta Nutting, DO  ergocalciferol (VITAMIN D2) 50000 units capsule Take 1 capsule (50,000 Units total) by mouth once a week. 08/02/18  Yes [place], Holli Humbles, NP  PROAIR HFA 108 (90 Base) MCG/ACT inhaler INHALE 2 PUFFS BY MOUTH EVERY 6 HOURS AS NEEDED Patient  taking differently: Inhale 2 puffs into the lungs every 6 (six) hours as needed for wheezing or shortness of breath. INHALE 2 PUFFS BY MOUTH EVERY 6 HOURS AS NEEDED 09/03/18  Yes [place], Rudell Cobb, MD  aspirin EC 325 MG tablet Take 1 tablet (325 mg total) by mouth 2 (two) times daily. Patient not taking: Reported on 09/14/2018 02/20/17   Leighton Parody, PA-C  ondansetron (ZOFRAN ODT) 4 MG disintegrating tablet Take 1 tablet (4 mg total) by mouth every 8 (eight) hours as needed for nausea or vomiting. Patient not taking: Reported on 09/25/2018 09/15/18   Carmin Muskrat, MD  ondansetron (ZOFRAN) 4 MG tablet Take 1 tablet (4 mg total) by mouth every 8 (eight) hours as needed for nausea or vomiting. Patient not taking: Reported on 09/25/2018 07/11/18   Luetta Nutting, DO    Physical Exam: Vitals:   09/25/18 1230 09/25/18 1300 09/25/18 1330 09/25/18 1400  BP: (!) 163/91 124/84 129/66 129/67  Pulse: (!) 112 (!) 111 (!) 110 (!) 111  Resp: (!) 21 14 14 16   Temp:      TempSrc:      SpO2: 100% 100% 100% 100%  Weight:      Height:         General:  Appears calm and comfortable and is NAD; she appears much older than stated age and is disheveled Eyes:  PERRL, EOMI, normal lids, iris; + vertical nystagmus ENT:  grossly normal hearing, lips & tongue, mmm; edentulous Neck:  no LAD, masses or thyromegaly; no carotid bruits Cardiovascular: Tachycardia, no m/r/g. No LE edema.  Respiratory:  CTA bilaterally with no wheezes/rales/rhonchi.  Normal respiratory effort. Abdomen:  soft, somewhat tender in the LLQ, ND, NABS Back:   normal  alignment, no CVAT, s/p LP Skin:  no rash or induration seen on limited exam Musculoskeletal:  grossly normal tone BUE/BLE, good ROM, no bony abnormality Psychiatric: eccentric/blunted mood and affect, speech fluent and appropriate, AOx3 Neurologic: CN 2-12 grossly intact, moves all extremities in coordinated fashion, +finger to nose ataxia    Radiological Exams on Admission: Ct Head Wo Contrast  Result Date: 09/25/2018 CLINICAL DATA:  52 year old female with altered level of consciousness. Initial encounter. EXAM: CT HEAD WITHOUT CONTRAST TECHNIQUE: Contiguous axial images were obtained from the base of the skull through the vertex without intravenous contrast. COMPARISON:  None. FINDINGS: Brain: No intracranial hemorrhage or CT evidence of large acute infarct. No intracranial mass lesion noted on this unenhanced exam. Vascular: No hyperdense vessel.  Minimal vascular calcifications. Skull: No skull fracture. Sinuses/Orbits: No acute orbital abnormality. Visualized paranasal sinuses are clear. Other: Minimal partial opacification inferior mastoid air cells bilaterally. Middle ear cavities clear. No obstructing lesion of eustachian tube noted. IMPRESSION: 1. No acute intracranial abnormality noted. 2. Minimal partial opacification inferior mastoid air cells. Electronically Signed   By: Genia Del M.D.   On: 09/25/2018 12:40   Dg Chest Port 1 View  Result Date: 09/25/2018 CLINICAL DATA:  Cough and fever EXAM: PORTABLE CHEST 1 VIEW COMPARISON:  February 08/07/2017 FINDINGS: No edema or consolidation. The heart size and pulmonary vascularity are normal. No adenopathy. No bone lesions. IMPRESSION: No edema or consolidation. Electronically Signed   By: Lowella Grip III M.D.   On: 09/25/2018 12:31    EKG: Independently reviewed.  Sinus tachycardia with rate 130; nonspecific ST changes with no evidence of acute ischemia   Labs on Admission: I have personally reviewed the available labs and  imaging studies at the time of  the admission.  Pertinent labs:   K+ 2.9 CO2 16; 19 on 9/27, 24 on 8/14 Glucose 117 Anion gap 17 Troponin 0.01 Lactate 2.53 CBC generally unremarkable INR 1.08 ETOH <10 HCG <5 UA on 9/27: 80 ketones, trace LE, 30 protein, few bacteria LP: Glucose 67, 1 RBC, 1 WBC, protein 46  Assessment/Plan Principal Problem:   Altered mental status Active Problems:   Tobacco abuse   HTN (hypertension)   Metabolic acidosis   Malnutrition (HCC)   ETOH abuse   Altered mental status -Patient presenting with AMS -LP negative -CT unremarkable -Sepsis is a consideration (see below), although seems less likely -She has interesting neurologic findings including altered finger-to-nose testing and vertical nystagmus and her conglomeration of findings make Wernicke's encephalopathy seem a likely cause -Will admit for planned thiamine infusion, 500 mg IV BID x 3 days and then will need 100 mg PO thiamine daily -Assuming this is related to Wernicke's, the patient may not have significant meaningful recovery depending on how long this issue has been going on -Wernicke's may be related to chronic malnutrition (which the patient and her son acknowledge) or may be due to ETOH abuse (which she is less forthright about) -Will monitor her mental status during hospitalization to see if it shows improvement -PT/OT, nutrition consults requested  Sepsis -SIRS criteria in this patient includes:  Tachycardia, marginal tachypnea  -Patient has evidence of acute organ failure with elevated lactate  -While awaiting blood cultures, this may be a preseptic condition. -Sepsis protocol initiated -Negative CXR -Awaiting UA -Negative LP -Blood, CSF and urine cultures pending -Will place on telemetry and continue to monitor -Treat with IV Cefepime/Vanc/Flagyl for undifferentiated sepsis -Will add HIV -Will trend lactate to ensure improvement -Will order lower respiratory tract  procalcitonin level.  Antibiotics would not be indicated for PCT <0.1 and probably should not be used for < 0.25.  >0.5 indicates infection and >>0.5 indicates more serious disease.  As the procalcitonin level normalizes, it will be reasonable to consider de-escalation of antibiotic coverage. -All this said, sepsis seems less likely the source of these issues.  Instead, suspect that her tachycardia and elevated lactate are resulting from malnutrition and volume deficiency and will improve with rehydration and nutrition support. -Consider d/c of antibiotics within 12-24 hours if sepsis is ruled out and cultures are negative.  Metabolic acidosis -As noted above, this seems likely related to volume deficiency and malnutrition. -She has marked ketonuria and elevated lactate -Will hydrate and follow -Normal renal function at this time -She does not appear to be taking medications that would cause acidosis.  HTN -Continue Norvasc  Malnutrition -Thiamine repletion, as noted above -Thiamine level pending prior to initiation of thiamine repletion -Nutrition consult -Soft diet for now given her edentulous condition  COPD -Suspect cough is related to COPD and ongoing tobacco use -Will start prn albuterol nebs -No obvious exacerbation at this time, but she is on antibiotics for now -No steroids at this time  ETOH abuse -She downplays her use of ETOH but suspect that there may be more significant use than she admits -She reports that she is drinking "way less" than before but then is vague in her current and past use - "I mean, not like 18 beers or anything..." -Will cover with CIWA for now empirically -UDS is pending  Tobacco abuse -Encourage cessation.  This was discussed with the patient and should be reviewed on an ongoing basis.   -Patch ordered at patient request.  DVT prophylaxis:  Lovenox Code Status:  Full - confirmed with patient Family Communication: None present  Disposition  Plan:  Home once clinically improved Consults called: Neurology; Nutrition/PT/OT  Admission status: Admit - It is my clinical opinion that admission to INPATIENT is reasonable and necessary because of the expectation that this patient will require hospital care that crosses at least 2 midnights to treat this condition based on the medical complexity of the problems presented.  Given the aforementioned information, the predictability of an adverse outcome is felt to be significant.    Karmen Bongo MD [facility]  If note is complete, please contact covering daytime or nighttime physician. [email or web address] Password [email or web address]  09/25/2018, 3:00 PM

## 2018-09-25 NOTE — ED Provider Notes (Signed)
Weak and jittery and has been unable to do anything today, she cannot get out of bed and walk. [facility] Provider Note   CSN: [id number] Arrival date & time: 09/25/18  1121     History   Chief Complaint Chief Complaint  Patient presents with  . Tachycardia    HPI Wendy Edwards is a 52 y.o. female.  HPI  The patient is a 52 year old female, she presents to the hospital with a complaint of altered mental status.  According to the paramedics who are the primary historians the patient was and does live by herself, he called this morning to talk to her and felt like she had an abnormal mental status like she might be hallucinating.  The patient is unable to give me any other information other than stating that she feels like she has some difficulty breathing, has been coughing, has been nauseated.  Review of the medical record shows that the patient was seen most recently in the emergency department on September 27 when she presented with vomiting and weakness during which time she was found to have ketonuria, trace leukocytes, hemoglobin of 15, liver function which was slightly elevated in the 70s, bilirubin of 1.4 and an anion gap of 21.  Was given IV fluids and released home and she was noted to be slightly tachycardic, she was feeling better, she was agreeable to discharge.  Her ultrasound was reassuring at that time.  She presents today and is unable to give me much in the way of information.  Level 5 caveat applies secondary to altered mental status.  Past Medical History:  Diagnosis Date  . 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 Aplastic anemia ([place])    hx/notes 05/09/2007  . Arthritis    "right knee" (02/21/2017)  . Asthma   . Chronic anxiety    Archie Endo 02/04/2016  . COPD (chronic obstructive pulmonary disease) ([place])    hx/notes 05/09/2007  . Daily headache   .  Depression   . DVT (deep venous thrombosis) ([facility])    hx upper and lower extremeties/notes 05/09/2007  . GERD (gastroesophageal reflux disease)   . Heart murmur 1974  . History of blood transfusion    "related to my aplastic anemia"  . History of kidney stones   . Hypertension   . Migraine    "a few/year" (02/21/2017)  . Pneumonia 1974   hospitalized "w/double pneumonia"  . Serum sickness    hx/notes 05/09/2007  . Transaminitis    asymptomatic hx/notes 05/09/2007    Patient Active Problem List   Diagnosis Date Noted  . Chronic bilateral low back pain without sciatica 07/11/2018  . Screening for breast cancer 07/11/2018  . 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 Pain due to knee joint prosthesis ([place]), right 02/16/2017  . Hx of aplastic anemia 10/31/2012  . HTN (hypertension) 06/19/2012  . Tobacco abuse 05/18/2012    Past Surgical History:  Procedure Laterality Date  . CYSTOSCOPY W/ URETEROSCOPY W/ LITHOTRIPSY    . EXTREMITY CYST EXCISION Left 2017   upper, inner arm  . KNEE ARTHROSCOPY Right 1985  . MEDIAL PARTIAL KNEE REPLACEMENT Right 2005  . TOTAL ABDOMINAL HYSTERECTOMY  2003   hx/notes 05/09/2007  . TOTAL KNEE REVISION Right 02/20/2017   Procedure: TOTAL KNEE REVISION;  Surgeon: Frederik Pear, MD;  Location: [facility];  Service: Orthopedics;  Laterality: Right;  . TUBAL LIGATION    .  TUNNELED VENOUS CATHETER PLACEMENT Right    hx/notes 05/09/2007; "took it out ~ 1 wk later"     OB History   None      Home Medications    Prior to Admission medications   Medication Sig Start Date End Date Taking? Authorizing Provider  albuterol (ACCUNEB) 1.25 MG/3ML nebulizer solution TAKE 3 ML(1 VIAL) BY NEBULIZATION 3 TIMES DAILY AS NEEDED FOR WHEEZING. Patient taking differently: Take 1 ampule by nebulization every 6 (six) hours as needed for wheezing or shortness of breath.  06/01/18   [place], Holli Humbles, NP  ALPRAZolam Duanne Moron) 1 MG tablet TAKE 1 TABLET BY MOUTH THREE TIMES A  DAY AS NEEDED Patient taking differently: Take 1 mg by mouth 2 (two) times daily as needed for anxiety.  08/23/18   [place], Holli Humbles, NP  amLODipine (NORVASC) 5 MG tablet Take 1 tablet (5 mg total) by mouth daily. 07/11/18   Luetta Nutting, DO  aspirin EC 325 MG tablet Take 1 tablet (325 mg total) by mouth 2 (two) times daily. Patient not taking: Reported on 09/14/2018 02/20/17   Leighton Parody, PA-C  ergocalciferol (VITAMIN D2) 50000 units capsule Take 1 capsule (50,000 Units total) by mouth once a week. Patient not taking: Reported on 09/14/2018 08/02/18   [place], Holli Humbles, NP  ondansetron (ZOFRAN ODT) 4 MG disintegrating tablet Take 1 tablet (4 mg total) by mouth every 8 (eight) hours as needed for nausea or vomiting. 09/15/18   Carmin Muskrat, MD  ondansetron (ZOFRAN) 4 MG tablet Take 1 tablet (4 mg total) by mouth every 8 (eight) hours as needed for nausea or vomiting. 07/11/18   Luetta Nutting, DO  PROAIR HFA 108 (90 Base) MCG/ACT inhaler INHALE 2 PUFFS BY MOUTH EVERY 6 HOURS AS NEEDED Patient taking differently: Inhale 2 puffs into the lungs every 6 (six) hours as needed for wheezing or shortness of breath. INHALE 2 PUFFS BY MOUTH EVERY 6 HOURS AS NEEDED 09/03/18   [name], MD    Family History Family History  Problem Relation Age of Onset  . Coronary artery disease Father 77       CABG  . Cancer Father   . Hypertension Mother   . Cancer Mother     Social History Social History   Tobacco Use  . Smoking status: Current Every Day Smoker    Packs/day: 0.50    Years: 28.00    Pack years: 14.00    Types: Cigarettes    Start date: 03/01/1988  . Smokeless tobacco: Never Used  Substance Use Topics  . Alcohol use: Yes    Alcohol/week: 12.0 standard drinks    Types: 12 Cans of beer per week  . Drug use: No     Allergies   Bee venom   Review of Systems Review of Systems  Unable to perform ROS: Mental status change     Physical Exam Updated Vital Signs BP (!)  147/91   Pulse (!) 130   Temp 100.1 F (37.8 C) (Rectal)   Resp 18   Ht 1.575 m (5\' 2" )   Wt 63 kg   SpO2 98%   BMI 25.40 kg/m   Physical Exam  Constitutional: She appears well-developed and well-nourished. She appears distressed.  HENT:  Head: Normocephalic and atraumatic.  Mouth/Throat: Oropharynx is clear and moist. No oropharyngeal exudate.  Eyes: Pupils are equal, round, and reactive to light. Conjunctivae and EOM are normal. Right eye exhibits no discharge. Left eye exhibits no discharge. No  scleral icterus.  Neck: Normal range of motion. Neck supple. No JVD present. No thyromegaly present.  Cardiovascular: Regular rhythm, normal heart sounds and intact distal pulses. Exam reveals no gallop and no friction rub.  No murmur heard. Tachycardic to 130 bpm  Pulmonary/Chest: Breath sounds normal. She is in respiratory distress. She has no wheezes. She has no rales.  Normal lung sounds, mild tachypnea  Abdominal: Soft. Bowel sounds are normal. She exhibits no distension and no mass. There is no tenderness.  Musculoskeletal: Normal range of motion. She exhibits no edema or tenderness.  Weak, thin, some muscle wasting present in the lower extremities  Lymphadenopathy:    She has no cervical adenopathy.  Neurological: She is alert. Coordination normal.  The patient is able to speak, she has no slurred speech, her cranial nerves III through XII appear okay except when I try to range her extraocular movements.  She will not look to the left or the right, she has some vertical nystagmus, she has some difficulty with finger-nose-finger on the right hand.  She can heel shin bilaterally, there is mild pronator drift of the right arm.  Skin: Skin is warm and dry. No rash noted. No erythema.  Psychiatric: She has a normal mood and affect. Her behavior is normal.  Nursing note and vitals reviewed.    ED Treatments / Results  Labs (all labs ordered are listed, but only abnormal results are  displayed) Labs Reviewed  CBG MONITORING, ED - Abnormal; Notable for the following components:      Result Value   Glucose-Capillary 123 (*)    All other components within normal limits    EKG EKG Interpretation  Date/Time:  Tuesday September 25 2018 11:25:56 EDT Ventricular Rate:  130 PR Interval:    QRS Duration: 103 QT Interval:  338 QTC Calculation: 498 R Axis:   -139 Text Interpretation:  Sinus tachycardia Consider right ventricular hypertrophy Inferior infarct, old Since last tracing rate faster Confirmed by Noemi Chapel [phone]) on 09/25/2018 11:38:03 AM   Radiology No results found.  Procedures .Critical Care Performed by: Noemi Chapel, MD Authorized by: Noemi Chapel, MD   Critical care provider statement:    Critical care time (minutes):  35   Critical care time was exclusive of:  Separately billable procedures and treating other patients and teaching time   Critical care was necessary to treat or prevent imminent or life-threatening deterioration of the following conditions:  CNS failure or compromise   Critical care was time spent personally by me on the following activities:  Blood draw for specimens, development of treatment plan with patient or surrogate, discussions with consultants, evaluation of patient's response to treatment, examination of patient, obtaining history from patient or surrogate, ordering and performing treatments and interventions, ordering and review of laboratory studies, ordering and review of radiographic studies, pulse oximetry, re-evaluation of patient's condition and review of old charts  .Lumbar Puncture Date/Time: 09/25/2018 1:47 PM Performed by: Noemi Chapel, MD Authorized by: Noemi Chapel, MD   Consent:    Consent obtained:  Verbal   Consent given by:  Patient   Risks discussed:  Bleeding, headache, infection, nerve damage and pain   Alternatives discussed:  No treatment Pre-procedure details:    Procedure purpose:   Diagnostic   Preparation: Patient was prepped and draped in usual sterile fashion   Anesthesia (see MAR for exact dosages):    Anesthesia method:  Local infiltration   Local anesthetic:  Lidocaine 1% WITH epi Procedure details:  Lumbar space:  L4-L5 interspace   Patient position:  R lateral decubitus   Needle gauge:  20   Needle type:  Spinal needle - Quincke tip   Needle length (in):  3.5   Ultrasound guidance: no     Number of attempts:  1   Closing pressure (cm H2O):  11   Fluid appearance:  Clear   Tubes of fluid:  4   Total volume (ml):  7 Post-procedure:    Puncture site:  Adhesive bandage applied   Patient tolerance of procedure:  Tolerated well, no immediate complications   (including critical care time)  Medications Ordered in ED Medications - No data to display   Initial Impression / Assessment and Plan / ED Course  I have reviewed the triage vital signs and the nursing notes.  Pertinent labs & imaging results that were available during my care of the patient were reviewed by me and considered in my medical decision making (see chart for details).     The patient has multiple significant abnormalities, she is tachypneic, tachycardic, short of breath, she has an abnormal neurologic exam and has a borderline temperature of 100.1 by rectal route.  She will need a CT scan of the brain, she will need labs, she may have an infection, would consider other causes as well including stroke, meningitis, pneumonia.  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  CT scan does not reveal an acute stroke.  Lumbar puncture was performed and showed clear fluid, labs pending.  Will discuss with neurology and hospitalist for admission.  Thankfully there is  no leukocytosis, she is hypokalemic and this has been replaced with potassium and magnesium.  She has been given IV fluid resuscitation for her significant tachycardia and lactic acidosis, thus far I do not see any signs of obvious infection.  Discussed with Dr. Karmen Bongo who will admit to the hospital  Final Clinical Impressions(s) / ED Diagnoses   Final diagnoses:  Acute encephalopathy  Nystagmus  Lactic acidosis      Noemi Chapel, MD 09/25/18 1347

## 2018-09-25 NOTE — ED Triage Notes (Signed)
Per [facility] pt coming from home where she lives alone, son called EMS stating pt is altered and not acting herself. Pt c/o left sided abdominal pain. Pt has nystagmus and disorientated to time.

## 2018-09-25 NOTE — Telephone Encounter (Signed)
Son called (not on [place]) with concern for his mother. (Son was not with pt at time of call and pt is home alone.) Pt had her teeth removed and has new dentures. Now pt is not taking care of herself, is not mobile except to use a bedside commode and is having visual and auditory hallucinations. Son stated he thinks pt is depressed. Pt is not eating or drinking. Son stated pt has been in bed for a month.  Advised son to get pt to the ED ASAP. Explained pt could be a threat to herself and/or others and needs evaluation.  Spoke with [name]  [phone]) who gave information.  Reason for Disposition . Nursing judgment or information in reference  Answer Assessment - Initial Assessment Questions 1. REASON FOR CALL: "What is your main concern right now?"     Pt is having auditory and visual hallucinations per son. Pt is not eating or drinking and not ambulating. Pt is not able to care for herself 2. ONSET: "When did the  Hallucinations start?"     2 weeks ago 3. SEVERITY: "How bad is the hallucinations?"     Pt asks for food but does not eat . Pt has new dentures after getting her teeth removed 4. FEVER: "Do you have a fever?"     Son is not with pt 5. OTHER SYMPTOMS: "Do you have any other new symptoms?"     Self care deficit, hallucinating 6. INTERVENTIONS AND RESPONSE: "What have you done so far to try to make this better? What medications have you used?"     Son does not know 7. PREGNANCY: "Is there any chance you are pregnant?"     n/a  Protocols used: NO GUIDELINE AVAILABLE-A-AH

## 2018-09-25 NOTE — Progress Notes (Signed)
Wendy Edwards is a 52 y.o. female patient admitted from ED awake, alert - oriented  X 4 - no acute distress noted.  VSS - Blood pressure 121/83, pulse (!) 113, temperature 98.1 F (36.7 C), temperature source Oral, resp. rate 16, height 5\' 3"  (1.6 m), weight 66.2 kg, SpO2 99 %.    IV in place, occlusive dsg intact without redness.  Orientation to room, and floor completed with information packet given to patient/family.  Patient declined safety video at this time.  Admission INP armband ID verified with patient/family, and in place.   SR up x 2, fall assessment complete, with patient and family able to verbalize understanding of risk associated with falls, and verbalized understanding to call nsg before up out of bed.  Call light within reach, patient able to voice, and demonstrate understanding.  Skin, clean-dry- intact without evidence of bruising, or skin tears.   No evidence of skin break down noted on exam.     Will cont to eval and treat per MD orders.  Holley Raring, RN 09/25/2018 4:13 PM

## 2018-09-25 NOTE — Telephone Encounter (Signed)
fyi

## 2018-09-26 DIAGNOSIS — G9340 Encephalopathy, unspecified: Secondary | ICD-10-CM

## 2018-09-26 LAB — BASIC METABOLIC PANEL
Anion gap: 8 (ref 5–15)
BUN: 11 mg/dL (ref 6–20)
CALCIUM: 8 mg/dL — AB (ref 8.9–10.3)
CHLORIDE: 108 mmol/L (ref 98–111)
CO2: 20 mmol/L — AB (ref 22–32)
CREATININE: 0.43 mg/dL — AB (ref 0.44–1.00)
GFR calc non Af Amer: >60 mL/min (ref >60)
GLUCOSE: 106 mg/dL — AB (ref 70–99)
Potassium: 2.3 mmol/L — CL (ref 3.5–5.1)
Sodium: 136 mmol/L (ref 135–145)

## 2018-09-26 LAB — URINALYSIS, ROUTINE W REFLEX MICROSCOPIC
Bilirubin Urine: NEGATIVE
GLUCOSE, UA: NEGATIVE mg/dL
Ketones, ur: 20 mg/dL — AB
Nitrite: NEGATIVE
PROTEIN: NEGATIVE mg/dL
Specific Gravity, Urine: 1.012 (ref 1.005–1.030)
pH: 6 (ref 5.0–8.0)

## 2018-09-26 LAB — CBC
HEMATOCRIT: 32.2 % — AB (ref 36.0–46.0)
Hemoglobin: 11.4 g/dL — ABNORMAL LOW (ref 12.0–15.0)
MCH: 34.8 pg — AB (ref 26.0–34.0)
MCHC: 35.4 g/dL (ref 30.0–36.0)
MCV: 98.2 fL (ref 80.0–100.0)
NRBC: 0.3 % — AB (ref 0.0–0.2)
Platelets: 115 10*3/uL — ABNORMAL LOW (ref 150–400)
RBC: 3.28 MIL/uL — ABNORMAL LOW (ref 3.87–5.11)
RDW: 12.8 % (ref 11.5–15.5)
WBC: 5.8 10*3/uL (ref 4.0–10.5)

## 2018-09-26 LAB — RAPID URINE DRUG SCREEN, HOSP PERFORMED
Amphetamines: NOT DETECTED
BENZODIAZEPINES: NOT DETECTED
Barbiturates: NOT DETECTED
Cocaine: NOT DETECTED
Opiates: NOT DETECTED
Tetrahydrocannabinol: NOT DETECTED

## 2018-09-26 LAB — POTASSIUM: Potassium: 2.7 mmol/L — CL (ref 3.5–5.1)

## 2018-09-26 LAB — HEMOGLOBIN A1C
Hgb A1c MFr Bld: 4.7 % — ABNORMAL LOW (ref 4.8–5.6)
Mean Plasma Glucose: 88.19 mg/dL

## 2018-09-26 LAB — HERPES SIMPLEX VIRUS(HSV) DNA BY PCR
HSV 1 DNA: NEGATIVE
HSV 2 DNA: NEGATIVE

## 2018-09-26 LAB — MAGNESIUM: Magnesium: 1.7 mg/dL (ref 1.7–2.4)

## 2018-09-26 LAB — VITAMIN B12: VITAMIN B 12: 319 pg/mL (ref 180–914)

## 2018-09-26 LAB — HIV ANTIBODY (ROUTINE TESTING W REFLEX): HIV Screen 4th Generation wRfx: NONREACTIVE

## 2018-09-26 LAB — PHOSPHORUS: PHOSPHORUS: 1.2 mg/dL — AB (ref 2.5–4.6)

## 2018-09-26 LAB — VDRL, CSF: VDRL Quant, CSF: NONREACTIVE

## 2018-09-26 MED ORDER — MAGNESIUM SULFATE IN D5W 1-5 GM/100ML-% IV SOLN
1.0000 g | Freq: Once | INTRAVENOUS | Status: AC
  Administered 2018-09-26: 1 g via INTRAVENOUS
  Filled 2018-09-26: qty 100

## 2018-09-26 MED ORDER — POTASSIUM PHOSPHATES 15 MMOLE/5ML IV SOLN
20.0000 mmol | Freq: Once | INTRAVENOUS | Status: AC
  Administered 2018-09-26: 20 mmol via INTRAVENOUS
  Filled 2018-09-26: qty 6.67

## 2018-09-26 MED ORDER — POTASSIUM CHLORIDE CRYS ER 20 MEQ PO TBCR
40.0000 meq | EXTENDED_RELEASE_TABLET | Freq: Once | ORAL | Status: AC
  Administered 2018-09-26: 40 meq via ORAL
  Filled 2018-09-26: qty 2

## 2018-09-26 MED ORDER — POTASSIUM CHLORIDE 10 MEQ/100ML IV SOLN
10.0000 meq | INTRAVENOUS | Status: AC
  Administered 2018-09-26 (×4): 10 meq via INTRAVENOUS
  Filled 2018-09-26 (×3): qty 100

## 2018-09-26 MED ORDER — GI COCKTAIL ~~LOC~~
30.0000 mL | Freq: Once | ORAL | Status: AC
  Administered 2018-09-26: 30 mL via ORAL
  Filled 2018-09-26: qty 30

## 2018-09-26 NOTE — Progress Notes (Signed)
CRITICAL VALUE ALERT  Critical Value:  K+ 2.7  Date & Time Notied:  09/26/2018 and 1406  Provider Notified: Ghimire  Orders Received/Actions taken: Potasium Phosphate was started.

## 2018-09-26 NOTE — Progress Notes (Addendum)
Initial Nutrition Assessment  DOCUMENTATION CODES:   Not applicable  INTERVENTION:  Magic cup TID with meals, each supplement provides 290 kcal and 9 grams of protein  Downgrade to Dysphagia 2 (mechanical soft) diet for ease of intake, patient has difficulty chewing  Patient at risk for refeeding syndrome; Continue monitoring potassium, magnesium, phosphorus labs  Continue MVI daily   NUTRITION DIAGNOSIS:   Inadequate oral intake related to social / environmental circumstances(Lives alone, requires assistance preparing meals/acquiring food, depression, suspect ETOH abuse ) as evidenced by per patient/family report.  GOAL:   Patient will meet greater than or equal to 90% of their needs  MONITOR:   PO intake, Supplement acceptance, Diet advancement, Labs, Weight trends  REASON FOR ASSESSMENT:   Consult, Malnutrition Screening Tool Assessment of nutrition requirement/status  ASSESSMENT:   Wendy Edwards is a 52 yo female with PMH of HTN, COPD, aplastic anemia, right knee replacement, anxiety, tobacco abuse, and vitamin D deficiency admitted for altered mental status.   Visited Wendy Edwards today at bedside. She was eating breakfast which was french toast, milk, and coffee. She said she didn't want the oatmeal, will take note of this. She said her appetite is good today, she nearly finished her meal during visit. Wendy Edwards had 15 teeth pulled earlier this year in March. Her dentures were left at home but she reports her son is bringing them sometime today. She reports the dentures hurt when she is "getting used to them again", feels like "chewing on glass". Too much chewing and cutting food makes her tired; would recommend switch to Dysphagia 2 diet for ease of intake due to her trouble chewing.   Wendy Edwards lives alone in an apartment, reports her son has moved in temporarily "a couple months ago" to help her, though from her answers it is unclear how often he is actually there with her.  Her son is 95, works nights at [employer]. Patient doesn't cook for herself, per her report she barely gets out of bed. Son will sometimes come and microwave food for her, though she says not every day, "when she needs him to". She reports otherwise eating canned soups, crackers, ice cream during the day. Beverages include Diet coke/pepsi, sweet tea, 7-up. Tells me she drinks [employer] "ocassionally" not more than 2 beers when she does. Per her chart suspect she may be downplaying her alcohol intake. No MVI use at home.  She says she used to be 172 lbs "around 6 months ago"; somewhat consistent with chart wts. Her weight has seemed to trend downward since teeth pulled in March. She has lost 7% body weight since 7/24, however not significant for the time frame. Patient does not meet criteria for malnutrition at this time but is certainly at risk in the future.   PTA for 2 weeks she reports feeling nauseous and "eyes burning". Per chart she came [facility] ED [facility]/27 with complaints of N/V. Per her report she has prescription Zofran but it doesn't help her. When she came to [facility] ED 10/8 son reported she had been in bed for a month, had not been eating or drinking, was having hallucinations. She does admit to laying in bed all day "watches stories on tv", only ambulating with walker/cane, has a bedside commode. Doesn't drive and is dependent on son for meals/groceries. It is questionable whether the amount of assistance she receives from son is enough for her.  Patient has been seen for poor po intake before,  she has tried Boost Plus and Ensure supplements and says she doesn't like them, they are "too sweet". Currently she is eating 80%+ of meals which I was witness to today. Suspect that po intake at home would improve under more desirable environmental circumstances w/ more assistance. She likes ice cream so will provide Magic Cups TID for her here.  Per chart, pt has suspected Wernicke's  encephalopathy. Still awaiting B1 lab. Mild muscle wasting on lower extremities (consistent with sedentary lifestyle) otherwise no overt signs of micronutrient deficiencies. Low PO4, Mg, K suspect for refeeding syndrome.   Labs reviewed: Phosphorus 1.2, Magnesium 1.7, Potassium 2.3, Hgb 11.4, CBG 123, B12 319, A1C 4.7 Medications reviewed and include: Colace, folic acid, MVI, IV thiamine  NUTRITION - FOCUSED PHYSICAL EXAM:    Most Recent Value  Orbital Region  No depletion  Upper Arm Region  Mild depletion  Thoracic and Lumbar Region  No depletion  Buccal Region  No depletion  Temple Region  Mild depletion  Clavicle Bone Region  No depletion  Clavicle and Acromion Bone Region  No depletion  Scapular Bone Region  No depletion  Dorsal Hand  No depletion  Patellar Region  Mild depletion  Anterior Thigh Region  Mild depletion  Posterior Calf Region  Mild depletion  Edema (RD Assessment)  None  Hair  Reviewed  Eyes  Reviewed  Mouth  Reviewed [no visible teeth,  uses dentures ]  Skin  Reviewed  Nails  Reviewed      Diet Order:   Diet Order            DIET SOFT Room service appropriate? Yes; Fluid consistency: Thin  Diet effective ____              EDUCATION NEEDS:   No education needs have been identified at this time  Skin:  Skin Assessment: Reviewed RN Assessment  Last BM:  10/8  Height:   Ht Readings from Last 1 Encounters:  09/25/18 5\' 3"  (1.6 m)    Weight:   Wt Readings from Last 1 Encounters:  09/25/18 66.2 kg    Ideal Body Weight:  52.3 kg  BMI:  Body mass index is 25.85 kg/m.  Estimated Nutritional Needs:   Kcal:  1500-1700  Protein:  75-85  Fluid:  >/=1.5 L   Adelynne Joerger, Dietetic Intern [phone]

## 2018-09-26 NOTE — Progress Notes (Signed)
PROGRESS NOTE        PATIENT DETAILS Name: Wendy [facility] Age: 52 y.o. Sex: female Date of Birth: 06-Oct-1966 Admit Date: 09/25/2018 Admitting Physician Karmen Bongo, MD YDX:AJOINOMV, Einar Pheasant, DO  Brief Narrative: Patient is a 52 y.o. female prior history of hypertension, COPD-presenting with confusion, and nystagmus.  She apparently has had very poor oral intake for the past 1 year.  Suspicion for Warnicke's encephalopathy.  On IV thiamine.  Neurology following.  Subjective: Continues to have nystagmus.  But is awake and alert today.  Follows all commands.  Assessment/Plan: Wernicke's encephalopathy: Confusion has resolved-continues to have significant nystagmus-due to poor oral intake for the past 1 year (may have significant alcohol use as well)-suspicion for Warnicke's encephalopathy-on high-dose IV thiamine.  Lumbar puncture done on 10/8-CSF analysis not suggestive of infection.  Follow further ESF studies-await vitamin B1 levels.  SIR's: Suspect secondary to dehydration rather than infection-do not think she had sepsis.  Stop all antibiotics.  Follow cultures.  Hypokalemia/hypomagnesemia/hypophosphatemia: Continue to replete and recheck-watch for refeeding syndrome-have asked for nutrition consultation.  Metabolic acidosis: Suspect secondary to starvation ketosis-urine  ketones positive.  Supportive care  Hypertension: Controlled-continue amlodipine  COPD: Lungs are clear-continue with as needed nebs  Possible EtOH abuse: We will await arrival of family for more collaborative information-she seems to be very vague about alcohol use.  No signs of withdrawal at this point.  Continue Ativan per CIWA protocol.  DVT Prophylaxis: Prophylactic Lovenox   Code Status: Full code   Family Communication: None at bedside  Disposition Plan: Remain inpatient-SNF most likely on discharge-probably later this week.  Antimicrobial agents: Anti-infectives (From  admission, onward)   Start     Dose/Rate Route Frequency Ordered Stop   09/26/18 0100  vancomycin (VANCOCIN) IVPB 750 mg/150 ml premix     750 mg 150 mL/hr over 60 Minutes Intravenous Every 12 hours 09/25/18 1537     09/25/18 2200  ceFEPIme (MAXIPIME) 2 g in sodium chloride 0.9 % 100 mL IVPB     2 g 200 mL/hr over 30 Minutes Intravenous Every 12 hours 09/25/18 1537     09/25/18 1500  metroNIDAZOLE (FLAGYL) IVPB 500 mg     500 mg 100 mL/hr over 60 Minutes Intravenous Every 8 hours 09/25/18 1428     09/25/18 1230  vancomycin (VANCOCIN) IVPB 1000 mg/200 mL premix     1,000 mg 200 mL/hr over 60 Minutes Intravenous  Once 09/25/18 1225 09/25/18 1400   09/25/18 1230  piperacillin-tazobactam (ZOSYN) IVPB 3.375 g     3.375 g 100 mL/hr over 30 Minutes Intravenous  Once 09/25/18 1225 09/25/18 1329      Procedures: None  CONSULTS: Neurology  Time spent: 25 minutes-Greater than 50% of this time was spent in counseling, explanation of diagnosis, planning of further management, and coordination of care.  MEDICATIONS: Scheduled Meds: . amLODipine  5 mg Oral Daily  . docusate sodium  100 mg Oral BID  . enoxaparin (LOVENOX) injection  40 mg Subcutaneous Q24H  . feeding supplement (ENSURE ENLIVE)  237 mL Oral BID BM  . folic acid  1 mg Oral Daily  . multivitamin with minerals  1 tablet Oral Daily  . nicotine  14 mg Transdermal Daily  . sodium chloride flush  3 mL Intravenous Q12H   Continuous Infusions: . ceFEPime (MAXIPIME) IV Stopped (09/26/18 1030)  .  lactated ringers 125 mL/hr at 09/26/18 1032  . magnesium sulfate 1 - 4 g bolus IVPB    . metronidazole Stopped (09/26/18 0732)  . potassium chloride 10 mEq (09/26/18 1109)  . potassium PHOSPHATE IVPB (in mmol)    . thiamine injection 500 mg (09/26/18 1113)  . vancomycin Stopped (09/26/18 0253)   PRN Meds:.acetaminophen **OR** acetaminophen, albuterol, LORazepam **OR** LORazepam, ondansetron **OR** ondansetron (ZOFRAN) IV   PHYSICAL  EXAM: Vital signs: Vitals:   09/25/18 1524 09/25/18 1553 09/26/18 0024 09/26/18 0542  BP:  121/83 105/72 124/77  Pulse:  (!) 113 (!) 105 (!) 112  Resp:  16  17  Temp:  98.1 F (36.7 C) (!) 97.4 F (36.3 C) 98.2 F (36.8 C)  TempSrc:  Oral Oral Oral  SpO2:  99% 100% 100%  Weight: 66.2 kg     Height: 5\' 3"  (1.6 m)      Filed Weights   09/25/18 1134 09/25/18 1524  Weight: 63 kg 66.2 kg   Body mass index is 25.85 kg/m.   General appearance :Awake, alert, not in any distress. Eyes:Pink conjunctiva HEENT: Atraumatic and Normocephalic Neck: supple Resp:Good air entry bilaterally, no added sounds  CVS: S1 S2 regular, no murmurs.  GI: Bowel sounds present, Non tender and not distended with no gaurding, rigidity or rebound. Extremities: B/L Lower Ext shows no edema, both legs are warm to touch Neurology:  + Nystagmus-nonfocal Musculoskeletal:No digital cyanosis Skin:No Rash, warm and dry Wounds:N/A  I have personally reviewed following labs and imaging studies  LABORATORY DATA: CBC: Recent Labs  Lab 09/25/18 1148 09/25/18 1200 09/26/18 0508  WBC 8.9  --  5.8  NEUTROABS 5.2  --   --   HGB 15.3* 15.3* 11.4*  HCT 43.4 45.0 32.2*  MCV 99.1  --  98.2  PLT 143*  --  115*    Basic Metabolic Panel: Recent Labs  Lab 09/25/18 1148 09/25/18 1200 09/26/18 0508 09/26/18 0730  NA 139 139 136  --   K 2.9* 2.8* 2.3*  --   CL 106 107 108  --   CO2 16*  --  20*  --   GLUCOSE 117* 113* 106*  --   BUN 14 15 11   --   CREATININE 0.90 0.40* 0.43*  --   CALCIUM 9.3  --  8.0*  --   MG  --   --   --  1.7  PHOS  --   --   --  1.2*    GFR: Estimated Creatinine Clearance: 76.9 mL/min (A) (by C-G formula based on SCr of 0.43 mg/dL (L)).  Liver Function Tests: Recent Labs  Lab 09/25/18 1148  AST 36  ALT 40  ALKPHOS 75  BILITOT 1.1  PROT 6.7  ALBUMIN 3.5   No results for input(s): LIPASE, AMYLASE in the last 168 hours. No results for input(s): AMMONIA in the last 168  hours.  Coagulation Profile: Recent Labs  Lab 09/25/18 1148  INR 1.08    Cardiac Enzymes: No results for input(s): CKTOTAL, CKMB, CKMBINDEX, TROPONINI in the last 168 hours.  BNP (last 3 results) No results for input(s): PROBNP in the last 8760 hours.  HbA1C: Recent Labs    09/26/18 0929  HGBA1C 4.7*    CBG: Recent Labs  Lab 09/25/18 1131  GLUCAP 123*    Lipid Profile: No results for input(s): CHOL, HDL, LDLCALC, TRIG, CHOLHDL, LDLDIRECT in the last 72 hours.  Thyroid Function Tests: Recent Labs    09/25/18 1535  TSH 0.996    Anemia Panel: Recent Labs    09/26/18 0929  VITAMINB12 319    Urine analysis:    Component Value Date/Time   COLORURINE YELLOW 09/26/2018 0721   APPEARANCEUR HAZY (A) 09/26/2018 0721   LABSPEC 1.012 09/26/2018 0721   PHURINE 6.0 09/26/2018 0721   GLUCOSEU NEGATIVE 09/26/2018 0721   HGBUR SMALL (A) 09/26/2018 0721   BILIRUBINUR NEGATIVE 09/26/2018 0721   KETONESUR 20 (A) 09/26/2018 0721   PROTEINUR NEGATIVE 09/26/2018 0721   UROBILINOGEN 0.2 11/11/2010 0327   NITRITE NEGATIVE 09/26/2018 0721   LEUKOCYTESUR MODERATE (A) 09/26/2018 0721    Sepsis Labs: Lactic Acid, Venous    Component Value Date/Time   LATICACIDVEN 1.1 09/25/2018 1923    MICROBIOLOGY: Recent Results (from the past 240 hour(s))  CSF culture     Status: None (Preliminary result)   Collection Time: 09/25/18  1:30 PM  Result Value Ref Range Status   Specimen Description CSF  Final   Special Requests NONE  Final   Gram Stain   Final    CYTOSPIN SMEAR WBC PRESENT, PREDOMINANTLY MONONUCLEAR NO ORGANISMS SEEN    Culture   Final    NO GROWTH < 24 HOURS Performed at [facility], [place] [address] [place], [place] [address]    Report Status PENDING  Incomplete    RADIOLOGY STUDIES/RESULTS: Ct Head Wo Contrast  Result Date: 09/25/2018 CLINICAL DATA:  52 year old female with altered level of consciousness. Initial encounter. EXAM: CT HEAD WITHOUT  CONTRAST TECHNIQUE: Contiguous axial images were obtained from the base of the skull through the vertex without intravenous contrast. COMPARISON:  None. FINDINGS: Brain: No intracranial hemorrhage or CT evidence of large acute infarct. No intracranial mass lesion noted on this unenhanced exam. Vascular: No hyperdense vessel.  Minimal vascular calcifications. Skull: No skull fracture. Sinuses/Orbits: No acute orbital abnormality. Visualized paranasal sinuses are clear. Other: Minimal partial opacification inferior mastoid air cells bilaterally. Middle ear cavities clear. No obstructing lesion of eustachian tube noted. IMPRESSION: 1. No acute intracranial abnormality noted. 2. Minimal partial opacification inferior mastoid air cells. Electronically Signed   By: Genia Del M.D.   On: 09/25/2018 12:40   US Abdomen Limited  Result Date: 09/14/2018 CLINICAL DATA:  52 year old female with abdominal pain. Elevated LFTs. EXAM: ULTRASOUND ABDOMEN LIMITED RIGHT UPPER QUADRANT COMPARISON:  CT of the abdomen pelvis dated 10/03/2016 FINDINGS: Gallbladder: No gallstones or wall thickening visualized. No sonographic Murphy sign noted by sonographer. Common bile duct: Diameter: 5 mm Liver: Diffuse increased liver echogenicity most consistent with fatty infiltration. Superimposed fibrosis or inflammation not excluded. Portal vein is patent on color Doppler imaging with normal direction of blood flow towards the liver. IMPRESSION: Fatty liver otherwise unremarkable abdominal ultrasound. Electronically Signed   By: Anner Crete M.D.   On: 09/14/2018 23:39   Dg Chest Port 1 View  Result Date: 09/25/2018 CLINICAL DATA:  Cough and fever EXAM: PORTABLE CHEST 1 VIEW COMPARISON:  February 08/07/2017 FINDINGS: No edema or consolidation. The heart size and pulmonary vascularity are normal. No adenopathy. No bone lesions. IMPRESSION: No edema or consolidation. Electronically Signed   By: Lowella Grip III M.D.   On:  09/25/2018 12:31     LOS: 1 day   Oren Binet, MD  [facility]  If 7PM-7AM, please contact night-coverage  Please page via [email or web address] [email or web address]-click on MD name and type text message  09/26/2018, 11:53 AM

## 2018-09-26 NOTE — Progress Notes (Addendum)
Subjective: Patient has no complaints. Per nurse she did eat and is having son bring in her dentures.  Exam: Vitals:   09/26/18 0024 09/26/18 0542  BP: 105/72 124/77  Pulse: (!) 105 (!) 112  Resp:  17  Temp: (!) 97.4 F (36.3 C) 98.2 F (36.8 C)  SpO2: 100% 100%    Physical Exam   HEENT-  Normocephalic, no lesions, without obvious abnormality.  Normal external eye and conjunctiva.   Cardiovascular- S1-S2 audible, pulses palpable throughout   Lungs-no rhonchi or wheezing noted, no excessive working breathing.  Saturations within normal limits Abdomen- All 4 quadrants palpated and nontender Extremities- Warm, dry and intact Musculoskeletal-no joint tenderness, deformity or swelling Skin-warm and dry, no hyperpigmentation, vitiligo, or suspicious lesions    Neuro:  Mental Status: Alert, oriented, thought content appropriate.  Speech fluent without evidence of aphasia.  Able to follow 3 step commands without difficulty. Cranial Nerves: II:  Visual fields grossly normal,  III,IV, VI: bilateral ptosis, extra-ocular motions intact- still with vertical nystagmus worse when looking up. bilaterally pupils equal, round, reactive to light and accommodation V,VII: smile symmetric, facial light touch sensation normal bilaterally VIII: hearing normal bilaterally IX,X: uvula rises midline XI: bilateral shoulder shrug XII: midline tongue extension Motor: Right : Upper extremity   5/5    Left:     Upper extremity   5/5  Lower extremity   5/5     Lower extremity   5/5  Sensory: Pinprick and light touch intact throughout, bilaterally Deep Tendon Reflexes: 1+ and symmetric throughout with no AJ or KJ Plantars: Right: downgoing   Left: downgoing Cerebellar: normal finger-to-nose, with end point dysmetria     Medications:  Scheduled: . amLODipine  5 mg Oral Daily  . docusate sodium  100 mg Oral BID  . enoxaparin (LOVENOX) injection  40 mg Subcutaneous Q24H  . feeding supplement  (ENSURE ENLIVE)  237 mL Oral BID BM  . folic acid  1 mg Oral Daily  . multivitamin with minerals  1 tablet Oral Daily  . nicotine  14 mg Transdermal Daily  . sodium chloride flush  3 mL Intravenous Q12H   Continuous: . ceFEPime (MAXIPIME) IV Stopped (09/25/18 2200)  . lactated ringers 125 mL/hr at 09/26/18 0758  . metronidazole Stopped (09/26/18 0732)  . potassium chloride 100 mL/hr at 09/26/18 0758  . thiamine injection Stopped (09/26/18 0006)  . vancomycin Stopped (09/26/18 0253)    Pertinent Labs/Diagnostics: B1 pending TSH 0.996 B12 pending RPR pending A1c pening  Results for Wendy [facility], Wendy Edwards (MRN [id number]) as of 09/26/2018 08:53  Ref. Range 09/25/2018 12:26  Appearance, CSF Latest Ref Range: CLEAR  CLEAR (A)  Glucose, CSF Latest Ref Range: 40 - 70 mg/dL 67  RBC Count, CSF Latest Ref Range: 0 /cu mm 1 (H)  WBC, CSF Latest Ref Range: 0 - 5 /cu mm 1  Other Cells, CSF Unknown TOO FEW TO COUNT, SMEAR AVAILABLE FOR REVIEW  Color, CSF Latest Ref Range: COLORLESS  COLORLESS  Supernatant Unknown NOT INDICATED  Total  Protein, CSF Latest Ref Range: 15 - 45 mg/dL 46 (H)  Tube # Unknown 1    Ct Head Wo Contrast  Result Date: 09/25/2018 CLINICAL DATA:  52 year old female with altered level of consciousness. Initial encounter. EXAM: CT HEAD WITHOUT CONTRAST TECHNIQUE: Contiguous axial images were obtained from the base of the skull through the vertex without intravenous contrast. COMPARISON:  None. FINDINGS: Brain: No intracranial hemorrhage or CT evidence of large acute infarct. No  intracranial mass lesion noted on this unenhanced exam. Vascular: No hyperdense vessel.  Minimal vascular calcifications. Skull: No skull fracture. Sinuses/Orbits: No acute orbital abnormality. Visualized paranasal sinuses are clear. Other: Minimal partial opacification inferior mastoid air cells bilaterally. Middle ear cavities clear. No obstructing lesion of eustachian tube noted. IMPRESSION: 1. No acute  intracranial abnormality noted. 2. Minimal partial opacification inferior mastoid air cells. Electronically Signed   By: Genia Del M.D.   On: 09/25/2018 12:40   Dg Chest Port 1 View  Result Date: 09/25/2018 CLINICAL DATA:  Cough and fever EXAM: PORTABLE CHEST 1 VIEW COMPARISON:  February 08/07/2017 FINDINGS: No edema or consolidation. The heart size and pulmonary vascularity are normal. No adenopathy. No bone lesions. IMPRESSION: No edema or consolidation. Electronically Signed   By: Lowella Grip III M.D.   On: 09/25/2018 12:31     [name] PA-C [facility] [phone]   Assessment: 52 YO female with what is most likely wernickies encephalopathy. Still awaiting thiamine level.   Impression:  -CNS infection versus Autoimmune process - Nutritional deficiency such as thiamine deficiency causing Wernicke's  Recommendations: --Thiamine level --continue high dose thiamine for three days and then back to 100 mg daily --B12, RPR, A1c    09/26/2018, 8:47 AM   -- Wendy [place], MD [facility] Pager: [phone] If 7pm to 7am, please call on call as listed on AMION.

## 2018-09-26 NOTE — Clinical Social Work Note (Signed)
Clinical Social Work Assessment  Patient Details  Name: Wendy Edwards MRN: [id number] Date of Birth: 07-Apr-1966  Date of referral:  09/26/18               Reason for consult:  Facility Placement                Permission sought to share information with:  Facility Sport and exercise psychologist, Family Supports Permission granted to share information::  Yes, Verbal Permission Granted  Name::     [facility]::  SNFs  Relationship::  Sister  Contact Information:  [phone]  Housing/Transportation Living arrangements for the past 2 months:  Apartment Source of Information:  Patient Patient Interpreter Needed:  None Criminal Activity/Legal Involvement Pertinent to Current Situation/Hospitalization:  No - Comment as needed Significant Relationships:  Adult Children, Siblings Lives with:  Adult Children Do you feel safe going back to the place where you live?  No Need for family participation in patient care:  No (Coment)  Care giving concerns:  CSW received consult for possible SNF placement at time of discharge. CSW spoke with patient regarding PT recommendation of SNF placement at time of discharge. Patient reported that patient's son is currently unable to care for patient at their home given patient's current physical needs and fall risk due to him working. Patient expressed understanding of PT recommendation and is agreeable to SNF placement at time of discharge. CSW to continue to follow and assist with discharge planning needs.   Social Worker assessment / plan:  CSW spoke with patient concerning possibility of rehab at [facility] before returning home.  Employment status:  Disabled (Comment on whether or not currently receiving Disability) Insurance information:  Managed Medicare PT Recommendations:  [place] [facility] / Referral to community resources:  [place]  Patient/Family's Response to care:  Patient recognizes need for rehab before returning  home and is agreeable to a SNF in [place]. Patient reviewed SNF list and reported preference for [facility] since it close to her home. CSW has asked [facility] to review the referral and start insurance authorization process if possible.   Patient/Family's Understanding of and Emotional Response to Diagnosis, Current Treatment, and Prognosis:  Patient/family is realistic regarding therapy needs and expressed being hopeful for SNF placement. Patient expressed understanding of CSW role and discharge process as well as medical condition. No questions/concerns about plan or treatment.    Emotional Assessment Appearance:  Appears older than stated age Attitude/Demeanor/Rapport:  Gracious Affect (typically observed):  Accepting, Appropriate Orientation:  Oriented to Self, Oriented to Place, Oriented to Situation Alcohol / Substance use:  Alcohol Use Psych involvement (Current and /or in the community):  No (Comment)  Discharge Needs  Concerns to be addressed:  Care Coordination Readmission within the last 30 days:  No Current discharge risk:  Dependent with Mobility Barriers to Discharge:  Continued Medical Work up   Wendy Lynch, Wendy Edwards 09/26/2018, 4:16 PM

## 2018-09-26 NOTE — Evaluation (Signed)
Physical Therapy Evaluation Patient Details Name: Wendy Edwards MRN: [id number] DOB: 01-07-1966 Today's Date: 09/26/2018   History of Present Illness  Pt is a 52 year old woman admitted with AMS, malnutrition, ETOH abuse. 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 PMH: COPD, HTN, anxiety, aplastic anemia, headaches.  Clinical Impression  Pt admitted with above diagnosis and presents to PT with functional limitations due to deficits listed below (See PT problem list). Pt needs skilled PT to maximize independence and safety to allow discharge to ST-SNF prior to return home. Pt currently limited by lightheadedness and painful rt little toe which is bruised and swollen after a fall at home. On first stand pt reported she was dizzy. On further questioning pt described it as light headed. Pt returned to sitting. Stood again and took orthostatic BP's which were negative and pt denied light headedness.      Follow Up Recommendations SNF    Equipment Recommendations  Other (comment)(To be determined)    Recommendations for Other Services       Precautions / Restrictions Precautions Precautions: Fall Restrictions Weight Bearing Restrictions: No      Mobility  Bed Mobility Overal bed mobility: Modified Independent             General bed mobility comments: Pt up in chair  Transfers Overall transfer level: Needs assistance Equipment used: Rolling walker (2 wheeled) Transfers: Sit to/from Stand Sit to Stand: Min assist Stand pivot transfers: Min assist       General transfer comment: Assist to bring hips up and for balance  Ambulation/Gait Ambulation/Gait assistance: Min assist Gait Distance (Feet): 2 Feet Assistive device: Rolling walker (2 wheeled) Gait Pattern/deviations: Step-to pattern;Decreased step length - left;Decreased step length - right;Shuffle;Trunk flexed Gait velocity: decr Gait velocity interpretation: <1.31 ft/sec, indicative of household ambulator General Gait Details:  Assist for balance and support.  Stairs            Wheelchair Mobility    Modified Rankin (Stroke Patients Only)       Balance Overall balance assessment: Needs assistance Sitting-balance support: No upper extremity supported;Feet supported Sitting balance-Leahy Scale: Good     Standing balance support: Bilateral upper extremity supported Standing balance-Leahy Scale: Poor Standing balance comment: walker and min assist                             Pertinent Vitals/Pain Pain Assessment: Faces Faces Pain Scale: Hurts little more Pain Location: rt littley toe Pain Descriptors / Indicators: Throbbing Pain Intervention(s): Limited activity within patient's tolerance;Monitored during session    [place] expects to be discharged to:: Private residence Living Arrangements: Children(son is staying with pt) Available Help at Discharge: Family;Available PRN/intermittently(son works days) Type of Home: Apartment Home Access: Level entry     Home Layout: One level Home Equipment: Shower seat;Grab bars - tub/shower;Cane - single point;Walker - 4 wheels;Crutches;Bedside commode Additional Comments: reports 2 falls due to dizziness     Prior Function Level of Independence: Independent with assistive device(s);Needs assistance   Gait / Transfers Assistance Needed: walks with a cane typically. Has not amb in ~1 week and was only going bed to bsc in the past week  ADL's / Homemaking Assistance Needed: son helps with meals, sits to shower  Comments: pt reports she mostly watches tv sitting in her bed     Hand Dominance   Dominant Hand: Right    Extremity/Trunk Assessment   Upper Extremity Assessment Upper Extremity Assessment:  Defer to OT evaluation    Lower Extremity Assessment Lower Extremity Assessment: Generalized weakness       Communication   Communication: No difficulties  Cognition Arousal/Alertness: Awake/alert Behavior  During Therapy: Flat affect Overall Cognitive Status: Within Functional Limits for tasks assessed                                 General Comments: pt did think she was a [employer] Comments      Exercises     Assessment/Plan    PT Assessment Patient needs continued PT services  PT Problem List Decreased strength;Decreased activity tolerance;Decreased balance;Decreased mobility;Pain       PT Treatment Interventions DME instruction;Gait training;Functional mobility training;Therapeutic activities;Therapeutic exercise;Balance training;Patient/family education    PT Goals (Current goals can be found in the Care Plan section)  Acute Rehab PT Goals Patient Stated Goal: to go home  PT Goal Formulation: With patient Time For Goal Achievement: 10/10/18 Potential to Achieve Goals: Good    Frequency Min 3X/week   Barriers to discharge Decreased caregiver support son works and pt home alone    Co-evaluation               AM-PAC PT "6 Clicks" Daily Activity  Outcome Measure Difficulty turning over in bed (including adjusting bedclothes, sheets and blankets)?: None Difficulty moving from lying on back to sitting on the side of the bed? : None Difficulty sitting down on and standing up from a chair with arms (e.g., wheelchair, bedside commode, etc,.)?: Unable Help needed moving to and from a bed to chair (including a wheelchair)?: A Little Help needed walking in hospital room?: A Little Help needed climbing 3-5 steps with a railing? : Total 6 Click Score: 16    End of Session Equipment Utilized During Treatment: Gait belt Activity Tolerance: Patient limited by fatigue Patient left: in chair;with call bell/phone within reach;with chair alarm set Nurse Communication: Mobility status PT Visit Diagnosis: Unsteadiness on feet (R26.81);Other abnormalities of gait and mobility (R26.89);History of falling (Z91.81)    Time: 2244-9753 PT Time  Calculation (min) (ACUTE ONLY): 19 min   Charges:   PT Evaluation $PT Eval Moderate Complexity: [place] Pager [phone] Office [place] 09/26/2018, 12:18 PM

## 2018-09-26 NOTE — Evaluation (Signed)
Occupational Therapy Evaluation Patient Details Name: Wendy Edwards MRN: [id number] DOB: 06/09/1966 Today's Date: 09/26/2018    History of Present Illness Pt is a 52 year old woman admitted with AMS, malnutrition, ETOH abuse. 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 PMH: COPD, HTN, anxiety, aplastic anemia, headaches.   Clinical Impression   Pt reports walking with a cane, preparing simple meals and bathing and dressing herself. Pt has a sedentary lifestyle. She presents with R knee pain, generalized weakness and decreased standing balance. She requires min assist with increased effort to stand and mobilize. Recommending SNF for further rehab as her son works. Will follow acutely.   Follow Up Recommendations  SNF;Supervision/Assistance - 24 hour(may progress to home)    Equipment Recommendations  None recommended by OT    Recommendations for Other Services       Precautions / Restrictions Precautions Precautions: Fall Restrictions Weight Bearing Restrictions: No      Mobility Bed Mobility Overal bed mobility: Modified Independent                Transfers Overall transfer level: Needs assistance Equipment used: Rolling walker (2 wheeled) Transfers: Sit to/from [facility] Sit to Stand: Min assist Stand pivot transfers: Min assist       General transfer comment: cues for hand placement, assist to rise and steady, increased time, pt reporting R knee pain    Balance                                           ADL either performed or assessed with clinical judgement   ADL Overall ADL's : Needs assistance/impaired Eating/Feeding: Independent;Bed level   Grooming: Set up;Sitting   Upper Body Bathing: Set up;Sitting   Lower Body Bathing: Minimal assistance;Sit to/from stand   Upper Body Dressing : Set up;Sitting   Lower Body Dressing: Minimal assistance;Sit to/from stand   Toilet Transfer: Minimal assistance;Stand-pivot;Regular Toilet;RW   Toileting- Clothing Manipulation and Hygiene: Minimal assistance;Sit to/from stand               Vision Patient Visual Report: No change from baseline       Perception     Praxis      Pertinent Vitals/Pain Pain Assessment: Faces Faces Pain Scale: Hurts little more Pain Location: R knee Pain Descriptors / Indicators: Aching Pain Intervention(s): Monitored during session     Hand Dominance Right   Extremity/Trunk Assessment Upper Extremity Assessment Upper Extremity Assessment: Overall WFL for tasks assessed   Lower Extremity Assessment Lower Extremity Assessment: Defer to PT evaluation       Communication Communication Communication: No difficulties   Cognition Arousal/Alertness: Awake/alert Behavior During Therapy: Flat affect Overall Cognitive Status: Within Functional Limits for tasks assessed                                 General Comments: 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, museum/gallery Comments       Exercises     Shoulder Instructions      Home Living Family/patient expects to be discharged to:: Private residence Living Arrangements: Children(son is staying with pt) Available Help at Discharge: Family;Available PRN/intermittently(son works days) Type of Home: Apartment Home Access: Level entry     Home Layout: One level     Bathroom Shower/Tub: Teacher, early years/pre:  Standard     Home Equipment: Shower seat;Grab bars - tub/shower;Cane - single point;Walker - 4 wheels;Crutches;Bedside commode   Additional Comments: reports 2 falls due to dizziness       Prior Functioning/Environment Level of Independence: Independent with assistive device(s);Needs assistance  Gait / Transfers Assistance Needed: walks with a cane ADL's / Homemaking Assistance Needed: son helps with meals, sits to shower   Comments: pt reports she mostly watches tv sitting in her bed        OT Problem List: Decreased strength;Decreased  activity tolerance;Impaired balance (sitting and/or standing);Decreased knowledge of use of DME or AE;Pain      OT Treatment/Interventions: Self-care/ADL training;DME and/or AE instruction;Patient/family education;Balance training    OT Goals(Current goals can be found in the care plan section) Acute Rehab OT Goals Patient Stated Goal: to go home  OT Goal Formulation: With patient Time For Goal Achievement: 10/10/18 Potential to Achieve Goals: Good ADL Goals Pt Will Perform Grooming: with modified independence;standing Pt Will Perform Lower Body Bathing: with modified independence;sit to/from stand Pt Will Perform Lower Body Dressing: with modified independence;sit to/from stand Pt Will Transfer to Toilet: with modified independence;ambulating;bedside commode Pt Will Perform Toileting - Clothing Manipulation and hygiene: with modified independence;sit to/from stand Pt Will Perform Tub/Shower Transfer: with modified independence;ambulating;shower seat;rolling walker  OT Frequency: Min 2X/week   Barriers to D/C:            Co-evaluation              AM-PAC PT "6 Clicks" Daily Activity     Outcome Measure Help from another person eating meals?: None Help from another person taking care of personal grooming?: A Little Help from another person toileting, which includes using toliet, bedpan, or urinal?: A Little Help from another person bathing (including washing, rinsing, drying)?: A Little Help from another person to put on and taking off regular upper body clothing?: None Help from another person to put on and taking off regular lower body clothing?: A Little 6 Click Score: 20   End of Session Equipment Utilized During Treatment: Gait belt;Rolling walker  Activity Tolerance: Patient tolerated treatment well Patient left: in chair;with call bell/phone within reach;with chair alarm set  OT Visit Diagnosis: Unsteadiness on feet (R26.81);Other abnormalities of gait and mobility  (R26.89);Pain;History of falling (Z91.81)                Time: 3818-2993 OT Time Calculation (min): 37 min Charges:  OT General Charges $OT Visit: 1 Visit OT Evaluation $OT Eval Moderate Complexity: 1 Mod OT Treatments $Self Care/Home Management : 8-22 mins  Nestor Lewandowsky, OTR/L Acute Rehabilitation Services Pager: [phone] Office: [phone]  Malka So 09/26/2018, 10:36 AM

## 2018-09-26 NOTE — NC FL2 (Signed)
[place] LEVEL OF CARE SCREENING TOOL     IDENTIFICATION  Patient Name: Wendy Edwards Birthdate: March 08, 1966 Sex: female Admission Date (Current Location): 09/25/2018  [facility] [place] Number:  Herbalist and Address:  [facility] [place] [address], [place], [place] [address]      Provider Number: [id number]  Attending Physician Name and Address:  Jonetta Osgood, MD  Relative Name and Phone Number:       Current Level of Care: Hospital Recommended Level of Care: [place] Prior Approval Number:    Date Approved/Denied:   PASRR Number: [id number]  Discharge Plan: SNF    Current Diagnoses: Patient Active Problem List   Diagnosis Date Noted  . Altered mental status 09/25/2018  . Metabolic acidosis 02/63/7858  . Malnutrition ([place]) 09/25/2018  . ETOH abuse 09/25/2018  . Sepsis ([place]) 09/25/2018  . COPD (chronic obstructive pulmonary disease) ([place]) 09/25/2018  . Chronic bilateral low back pain without sciatica 07/11/2018  . Screening for breast cancer 07/11/2018  . 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 Pain due to knee joint prosthesis ([place]), right 02/16/2017  . Hx of aplastic anemia 10/31/2012  . HTN (hypertension) 06/19/2012  . Tobacco abuse 05/18/2012    Orientation RESPIRATION BLADDER Height & Weight     Self, Place  Normal Continent Weight: 66.2 kg Height:  5\' 3"  (160 cm)  BEHAVIORAL SYMPTOMS/MOOD NEUROLOGICAL BOWEL NUTRITION STATUS  (NA)   Continent Diet(Please see DC Summary-patient wears dentures)  AMBULATORY STATUS COMMUNICATION OF NEEDS Skin   Extensive Assist Verbally Normal                       Personal Care Assistance Level of Assistance  Bathing, Feeding, Dressing Bathing Assistance: Limited assistance Feeding assistance: Limited assistance Dressing Assistance: Limited assistance     Functional Limitations Info  Sight, Hearing, Speech Sight Info:  Adequate Hearing Info: Adequate Speech Info: Adequate    SPECIAL CARE FACTORS FREQUENCY  PT (By licensed PT), OT (By licensed OT), Speech therapy     PT Frequency: 5x/week OT Frequency: 3x/week     Speech Therapy Frequency: 2x/week      Contractures Contractures Info: Not present    Additional Factors Info  Code Status, Allergies Code Status Info: Full Allergies Info: Bee venom           Current Medications (09/26/2018):  This is the current hospital active medication list Current Facility-Administered Medications  Medication Dose Route Frequency Provider Last Rate Last Dose  . acetaminophen (TYLENOL) tablet 650 mg  650 mg Oral Q6H PRN Karmen Bongo, MD   650 mg at 09/26/18 8502   Or  . acetaminophen (TYLENOL) suppository 650 mg  650 mg Rectal Q6H PRN Karmen Bongo, MD      . albuterol (PROVENTIL) (2.5 MG/3ML) 0.083% nebulizer solution 2.5 mg  2.5 mg Nebulization Q2H PRN Karmen Bongo, MD      . amLODipine (NORVASC) tablet 5 mg  5 mg Oral Daily Karmen Bongo, MD   5 mg at 09/26/18 0837  . docusate sodium (COLACE) capsule 100 mg  100 mg Oral BID Karmen Bongo, MD   100 mg at 09/26/18 0837  . enoxaparin (LOVENOX) injection 40 mg  40 mg Subcutaneous Q24H Karmen Bongo, MD   40 mg at 09/25/18 1652  . feeding supplement (ENSURE ENLIVE) (ENSURE ENLIVE) liquid 237 mL  237 mL Oral BID BM Karmen Bongo, MD   237  mL at 09/26/18 0836  . folic acid (FOLVITE) tablet 1 mg  1 mg Oral Daily Karmen Bongo, MD   1 mg at 09/26/18 0837  . lactated ringers infusion   Intravenous Continuous Karmen Bongo, MD   Stopped at 09/26/18 1345  . LORazepam (ATIVAN) tablet 1 mg  1 mg Oral Q6H PRN Karmen Bongo, MD       Or  . LORazepam (ATIVAN) injection 1 mg  1 mg Intravenous Q6H PRN Karmen Bongo, MD   1 mg at 09/26/18 0016  . multivitamin with minerals tablet 1 tablet  1 tablet Oral Daily Karmen Bongo, MD   1 tablet at 09/26/18 0837  . nicotine (NICODERM CQ - dosed in mg/24  hours) patch 14 mg  14 mg Transdermal Daily Karmen Bongo, MD   14 mg at 09/26/18 0964  . ondansetron (ZOFRAN) tablet 4 mg  4 mg Oral Q6H PRN Karmen Bongo, MD       Or  . ondansetron [facility]) injection 4 mg  4 mg Intravenous Q6H PRN Karmen Bongo, MD   4 mg at 09/26/18 0015  . potassium PHOSPHATE 20 mmol in dextrose 5 % 500 mL infusion  20 mmol Intravenous Once Jonetta Osgood, MD 84 mL/hr at 09/26/18 1350 20 mmol at 09/26/18 1350  . sodium chloride flush (NS) 0.9 % injection 3 mL  3 mL Intravenous Q12H Karmen Bongo, MD      . thiamine 500mg  in normal saline (16ml) IVPB  500 mg Intravenous BID Karmen Bongo, MD   Stopped at 09/26/18 1143     Discharge Medications: Please see discharge summary for a list of discharge medications.  Relevant Imaging Results:  Relevant Lab Results:   Additional Information SSN: [place], [place]

## 2018-09-27 ENCOUNTER — Inpatient Hospital Stay (HOSPITAL_COMMUNITY): Payer: Medicare Other

## 2018-09-27 LAB — RPR: RPR Ser Ql: NONREACTIVE

## 2018-09-27 LAB — COMPREHENSIVE METABOLIC PANEL
ALT: 27 U/L (ref 0–44)
AST: 22 U/L (ref 15–41)
Albumin: 3.2 g/dL — ABNORMAL LOW (ref 3.5–5.0)
Alkaline Phosphatase: 60 U/L (ref 38–126)
Anion gap: 10 (ref 5–15)
BUN: <5 mg/dL — ABNORMAL LOW (ref 6–20)
CHLORIDE: 111 mmol/L (ref 98–111)
CO2: 22 mmol/L (ref 22–32)
CREATININE: 0.41 mg/dL — AB (ref 0.44–1.00)
Calcium: 8.4 mg/dL — ABNORMAL LOW (ref 8.9–10.3)
GFR calc non Af Amer: >60 mL/min (ref >60)
Glucose, Bld: 94 mg/dL (ref 70–99)
POTASSIUM: 2.5 mmol/L — AB (ref 3.5–5.1)
SODIUM: 143 mmol/L (ref 135–145)
Total Bilirubin: 0.5 mg/dL (ref 0.3–1.2)
Total Protein: 5.6 g/dL — ABNORMAL LOW (ref 6.5–8.1)

## 2018-09-27 LAB — URINE CULTURE: Culture: NO GROWTH

## 2018-09-27 LAB — MAGNESIUM: Magnesium: 1.5 mg/dL — ABNORMAL LOW (ref 1.7–2.4)

## 2018-09-27 LAB — PHOSPHORUS: PHOSPHORUS: 2.8 mg/dL (ref 2.5–4.6)

## 2018-09-27 LAB — POTASSIUM: Potassium: 2.9 mmol/L — ABNORMAL LOW (ref 3.5–5.1)

## 2018-09-27 MED ORDER — POTASSIUM CHLORIDE 20 MEQ/15ML (10%) PO SOLN
40.0000 meq | Freq: Once | ORAL | Status: AC
  Administered 2018-09-27: 40 meq via ORAL
  Filled 2018-09-27: qty 30

## 2018-09-27 MED ORDER — POTASSIUM CHLORIDE 20 MEQ/15ML (10%) PO SOLN: 40.0000 meq | Freq: Every day | ORAL | Status: DC

## 2018-09-27 MED ORDER — POTASSIUM CHLORIDE 10 MEQ/100ML IV SOLN
10.0000 meq | INTRAVENOUS | Status: AC
  Administered 2018-09-27 (×5): 10 meq via INTRAVENOUS
  Filled 2018-09-27 (×3): qty 100

## 2018-09-27 MED ORDER — MAGNESIUM SULFATE 2 GM/50ML IV SOLN
2.0000 g | Freq: Once | INTRAVENOUS | Status: AC
  Administered 2018-09-27: 2 g via INTRAVENOUS
  Filled 2018-09-27: qty 50

## 2018-09-27 MED ORDER — GADOBUTROL 1 MMOL/ML IV SOLN
6.5000 mL | Freq: Once | INTRAVENOUS | Status: AC | PRN
  Administered 2018-09-27: 6.5 mL via INTRAVENOUS

## 2018-09-27 NOTE — Progress Notes (Signed)
CRITICAL VALUE ALERT  Critical Value:  Potassium 2.5  Date & Time Notied:  09/27/18, 1483  Provider Notified: Raliegh Ip Schorr  Orders Received/Actions taken: Awaiting orders

## 2018-09-27 NOTE — Progress Notes (Signed)
[facility] able to accept patient and will begin insurance authorization process.  Percell Locus Lillyahna Hemberger LCSW [phone]

## 2018-09-27 NOTE — Progress Notes (Signed)
PROGRESS NOTE        PATIENT DETAILS Name: Wendy Edwards Age: 52 y.o. Sex: female Date of Birth: [birth year] Admit Date: 09/25/2018 Admitting Physician Karmen Bongo, MD ZOX:[id number], Einar Pheasant, DO  Brief Narrative: Patient is a 52 y.o. female prior history of hypertension, COPD-presenting with confusion, and nystagmus.  She apparently has had very poor oral intake for the past 1 year.  Suspicion for Warnicke's encephalopathy.  On IV thiamine.  Neurology following.  Subjective: Continues to have nystagmus.  But is awake and alert today.  Follows all commands.  Assessment/Plan: Wernicke's encephalopathy: Confusion has resolved-continues to have nystagmus-MRI brain pending.  CSF analysis not suggestive of any infection.  High suspicion for Warnicke's encephalopathy due to very poor long-term oral intake and ongoing alcohol use.  Awaiting vitamin B1 levels.  SIR's: Suspect secondary to dehydration rather than infection.  All antibiotics have been discontinued as of 10/9.  Blood and CSF cultures are negative so far.  Hypokalemia: Replete and recheck  Hypomagnesemia: Replete and recheck  Hypophosphatemia: Repleted.  Metabolic acidosis: Secondary to starvation ketosis, resolved.    Hypertension: Controlled-continue amlodipine.  COPD: Lungs are clear-continue with as needed nebs  Possible EtOH abuse: Does acknowledge alcohol use-claims she drinks every other day-3-4 bottles of beer.  No signs of withdrawal at this point.  Continue Ativan per protocol.    DVT Prophylaxis: Prophylactic Lovenox   Code Status: Full code   Family Communication: None at bedside  Disposition Plan: Remain inpatient-  Antimicrobial agents: Anti-infectives (From admission, onward)   Start     Dose/Rate Route Frequency Ordered Stop   09/26/18 0100  vancomycin (VANCOCIN) IVPB 750 mg/150 ml premix  Status:  Discontinued     750 mg 150 mL/hr over 60 Minutes Intravenous Every 12  hours 09/25/18 1537 09/26/18 1202   09/25/18 2200  ceFEPIme (MAXIPIME) 2 g in sodium chloride 0.9 % 100 mL IVPB  Status:  Discontinued     2 g 200 mL/hr over 30 Minutes Intravenous Every 12 hours 09/25/18 1537 09/26/18 1202   09/25/18 1500  metroNIDAZOLE (FLAGYL) IVPB 500 mg  Status:  Discontinued     500 mg 100 mL/hr over 60 Minutes Intravenous Every 8 hours 09/25/18 1428 09/26/18 1202   09/25/18 1230  vancomycin (VANCOCIN) IVPB 1000 mg/200 mL premix     1,000 mg 200 mL/hr over 60 Minutes Intravenous  Once 09/25/18 1225 09/25/18 1400   09/25/18 1230  piperacillin-tazobactam (ZOSYN) IVPB 3.375 g     3.375 g 100 mL/hr over 30 Minutes Intravenous  Once 09/25/18 1225 09/25/18 1329      Procedures: None  CONSULTS: Neurology  Time spent: 25 minutes-Greater than 50% of this time was spent in counseling, explanation of diagnosis, planning of further management, and coordination of care.  MEDICATIONS: Scheduled Meds: . amLODipine  5 mg Oral Daily  . docusate sodium  100 mg Oral BID  . enoxaparin (LOVENOX) injection  40 mg Subcutaneous Q24H  . folic acid  1 mg Oral Daily  . multivitamin with minerals  1 tablet Oral Daily  . nicotine  14 mg Transdermal Daily  . sodium chloride flush  3 mL Intravenous Q12H   Continuous Infusions: . lactated ringers 125 mL/hr at 09/27/18 0400  . potassium chloride 10 mEq (09/27/18 0940)  . thiamine injection Stopped (09/27/18 0115)   PRN Meds:.acetaminophen **OR** acetaminophen, albuterol,  LORazepam **OR** LORazepam, ondansetron **OR** ondansetron (ZOFRAN) IV   PHYSICAL EXAM: Vital signs: Vitals:   09/26/18 0542 09/26/18 1540 09/26/18 2151 09/27/18 0508  BP: 124/77 121/76 114/81 128/87  Pulse: (!) 112 100 100 (!) 112  Resp: 17  18 18   Temp: 98.2 F (36.8 C) 97.6 F (36.4 C) 97.7 F (36.5 C) 97.6 F (36.4 C)  TempSrc: Oral Oral  Oral  SpO2: 100% 100% 100% 100%  Weight:    68.4 kg  Height:       Filed Weights   09/25/18 1134 09/25/18  1524 09/27/18 0508  Weight: 63 kg 66.2 kg 68.4 kg   Body mass index is 26.7 kg/m.   General appearance:Awake, alert, not in any distress.  Eyes:no scleral icterus. HEENT: Atraumatic and Normocephalic.+ vertical and horizontal nystagmus. Neck: supple, no JVD. Resp:Good air entry bilaterally,no rales or rhonchi CVS: S1 S2 regular, no murmurs.  GI: Bowel sounds present, Non tender and not distended with no gaurding, rigidity or rebound. Extremities: B/L Lower Ext shows no edema, both legs are warm to touch Neurology:  Non focal Psychiatric: Normal judgment and insight. Normal mood. Musculoskeletal:No digital cyanosis Skin:No Rash, warm and dry Wounds:N/A  I have personally reviewed following labs and imaging studies  LABORATORY DATA: CBC: Recent Labs  Lab 09/25/18 1148 09/25/18 1200 09/26/18 0508  WBC 8.9  --  5.8  NEUTROABS 5.2  --   --   HGB 15.3* 15.3* 11.4*  HCT 43.4 45.0 32.2*  MCV 99.1  --  98.2  PLT 143*  --  115*    Basic Metabolic Panel: Recent Labs  Lab 09/25/18 1148 09/25/18 1200 09/26/18 0508 09/26/18 0730 09/26/18 1242 09/27/18 0347  NA 139 139 136  --   --  143  K 2.9* 2.8* 2.3*  --  2.7* 2.5*  CL 106 107 108  --   --  111  CO2 16*  --  20*  --   --  22  GLUCOSE 117* 113* 106*  --   --  94  BUN 14 15 11   --   --  <5*  CREATININE 0.90 0.40* 0.43*  --   --  0.41*  CALCIUM 9.3  --  8.0*  --   --  8.4*  MG  --   --   --  1.7  --  1.5*  PHOS  --   --   --  1.2*  --  2.8    GFR: Estimated Creatinine Clearance: 78.1 mL/min (A) (by C-G formula based on SCr of 0.41 mg/dL (L)).  Liver Function Tests: Recent Labs  Lab 09/25/18 1148 09/27/18 0347  AST 36 22  ALT 40 27  ALKPHOS 75 60  BILITOT 1.1 0.5  PROT 6.7 5.6*  ALBUMIN 3.5 3.2*   No results for input(s): LIPASE, AMYLASE in the last 168 hours. No results for input(s): AMMONIA in the last 168 hours.  Coagulation Profile: Recent Labs  Lab 09/25/18 1148  INR 1.08    Cardiac  Enzymes: No results for input(s): CKTOTAL, CKMB, CKMBINDEX, TROPONINI in the last 168 hours.  BNP (last 3 results) No results for input(s): PROBNP in the last 8760 hours.  HbA1C: Recent Labs    09/26/18 0929  HGBA1C 4.7*    CBG: Recent Labs  Lab 09/25/18 1131  GLUCAP 123*    Lipid Profile: No results for input(s): CHOL, HDL, LDLCALC, TRIG, CHOLHDL, LDLDIRECT in the last 72 hours.  Thyroid Function Tests: Recent Labs    09/25/18  1535  TSH 0.996    Anemia Panel: Recent Labs    09/26/18 0929  VITAMINB12 319    Urine analysis:    Component Value Date/Time   COLORURINE YELLOW 09/26/2018 0721   APPEARANCEUR HAZY (A) 09/26/2018 0721   LABSPEC 1.012 09/26/2018 0721   PHURINE 6.0 09/26/2018 0721   GLUCOSEU NEGATIVE 09/26/2018 0721   HGBUR SMALL (A) 09/26/2018 0721   BILIRUBINUR NEGATIVE 09/26/2018 0721   KETONESUR 20 (A) 09/26/2018 0721   PROTEINUR NEGATIVE 09/26/2018 0721   UROBILINOGEN 0.2 11/11/2010 0327   NITRITE NEGATIVE 09/26/2018 0721   LEUKOCYTESUR MODERATE (A) 09/26/2018 0721    Sepsis Labs: Lactic Acid, Venous    Component Value Date/Time   LATICACIDVEN 1.1 09/25/2018 1923    MICROBIOLOGY: Recent Results (from the past 240 hour(s))  Blood Culture (routine x 2)     Status: None (Preliminary result)   Collection Time: 09/25/18 11:39 AM  Result Value Ref Range Status   Specimen Description BLOOD LEFT ANTECUBITAL  Final   Special Requests   Final    BOTTLES DRAWN AEROBIC AND ANAEROBIC Blood Culture adequate volume   Culture   Final    NO GROWTH 2 DAYS Performed at [facility], [place] [address], [place], [place] [address]    Report Status PENDING  Incomplete  Blood Culture (routine x 2)     Status: None (Preliminary result)   Collection Time: 09/25/18 11:48 AM  Result Value Ref Range Status   Specimen Description BLOOD RIGHT ANTECUBITAL  Final   Special Requests   Final    BOTTLES DRAWN AEROBIC AND ANAEROBIC Blood Culture adequate volume    Culture   Final    NO GROWTH 2 DAYS Performed at [facility], [place] [address], [place], [place] [address]    Report Status PENDING  Incomplete  CSF culture     Status: None (Preliminary result)   Collection Time: 09/25/18  1:30 PM  Result Value Ref Range Status   Specimen Description CSF  Final   Special Requests NONE  Final   Gram Stain   Final    CYTOSPIN SMEAR WBC PRESENT, PREDOMINANTLY MONONUCLEAR NO ORGANISMS SEEN    Culture   Final    NO GROWTH 2 DAYS Performed at [facility], [place] [address], [place], [place] [address]    Report Status PENDING  Incomplete  Culture, Urine     Status: None   Collection Time: 09/26/18  7:30 AM  Result Value Ref Range Status   Specimen Description URINE, CLEAN CATCH  Final   Special Requests NONE  Final   Culture   Final    NO GROWTH Performed at [facility], [place] [address], [place], [place] [address]    Report Status 09/27/2018 FINAL  Final    RADIOLOGY STUDIES/RESULTS: Ct Head Wo Contrast  Result Date: 09/25/2018 CLINICAL DATA:  52 year old female with altered level of consciousness. Initial encounter. EXAM: CT HEAD WITHOUT CONTRAST TECHNIQUE: Contiguous axial images were obtained from the base of the skull through the vertex without intravenous contrast. COMPARISON:  None. FINDINGS: Brain: No intracranial hemorrhage or CT evidence of large acute infarct. No intracranial mass lesion noted on this unenhanced exam. Vascular: No hyperdense vessel.  Minimal vascular calcifications. Skull: No skull fracture. Sinuses/Orbits: No acute orbital abnormality. Visualized paranasal sinuses are clear. Other: Minimal partial opacification inferior mastoid air cells bilaterally. Middle ear cavities clear. No obstructing lesion of eustachian tube noted. IMPRESSION: 1. No acute intracranial  abnormality noted. 2. Minimal partial opacification inferior mastoid air cells. Electronically Signed   By: Genia Del M.D.   On: 09/25/2018 12:40    US Abdomen Limited  Result Date: 09/14/2018 CLINICAL DATA:  52 year old female with abdominal pain. Elevated LFTs. EXAM: ULTRASOUND ABDOMEN LIMITED RIGHT UPPER QUADRANT COMPARISON:  CT of the abdomen pelvis dated 10/03/2016 FINDINGS: Gallbladder: No gallstones or wall thickening visualized. No sonographic Murphy sign noted by sonographer. Common bile duct: Diameter: 5 mm Liver: Diffuse increased liver echogenicity most consistent with fatty infiltration. Superimposed fibrosis or inflammation not excluded. Portal vein is patent on color Doppler imaging with normal direction of blood flow towards the liver. IMPRESSION: Fatty liver otherwise unremarkable abdominal ultrasound. Electronically Signed   By: Anner Crete M.D.   On: 09/14/2018 23:39   Dg Chest Port 1 View  Result Date: 09/25/2018 CLINICAL DATA:  Cough and fever EXAM: PORTABLE CHEST 1 VIEW COMPARISON:  February 08/07/2017 FINDINGS: No edema or consolidation. The heart size and pulmonary vascularity are normal. No adenopathy. No bone lesions. IMPRESSION: No edema or consolidation. Electronically Signed   By: Lowella Grip III M.D.   On: 09/25/2018 12:31     LOS: 2 days   Oren Binet, MD  [facility]  If 7PM-7AM, please contact night-coverage  Please page via [email or web address]-click on MD name and type text message  09/27/2018, 9:49 AM

## 2018-09-28 LAB — COMPREHENSIVE METABOLIC PANEL
ALBUMIN: 2.6 g/dL — AB (ref 3.5–5.0)
ALT: 25 U/L (ref 0–44)
AST: 26 U/L (ref 15–41)
Alkaline Phosphatase: 50 U/L (ref 38–126)
Anion gap: 8 (ref 5–15)
BUN: <5 mg/dL — ABNORMAL LOW (ref 6–20)
CHLORIDE: 111 mmol/L (ref 98–111)
CO2: 25 mmol/L (ref 22–32)
Calcium: 8.2 mg/dL — ABNORMAL LOW (ref 8.9–10.3)
Creatinine, Ser: 0.37 mg/dL — ABNORMAL LOW (ref 0.44–1.00)
GFR calc Af Amer: >60 mL/min (ref >60)
GFR calc non Af Amer: >60 mL/min (ref >60)
GLUCOSE: 92 mg/dL (ref 70–99)
POTASSIUM: 2.7 mmol/L — AB (ref 3.5–5.1)
SODIUM: 144 mmol/L (ref 135–145)
TOTAL PROTEIN: 4.6 g/dL — AB (ref 6.5–8.1)
Total Bilirubin: 0.3 mg/dL (ref 0.3–1.2)

## 2018-09-28 LAB — BASIC METABOLIC PANEL
Anion gap: 9 (ref 5–15)
BUN: <5 mg/dL — ABNORMAL LOW (ref 6–20)
CHLORIDE: 108 mmol/L (ref 98–111)
CO2: 25 mmol/L (ref 22–32)
CREATININE: 0.48 mg/dL (ref 0.44–1.00)
Calcium: 8.4 mg/dL — ABNORMAL LOW (ref 8.9–10.3)
GFR calc non Af Amer: >60 mL/min (ref >60)
Glucose, Bld: 131 mg/dL — ABNORMAL HIGH (ref 70–99)
POTASSIUM: 3.5 mmol/L (ref 3.5–5.1)
SODIUM: 142 mmol/L (ref 135–145)

## 2018-09-28 LAB — CSF CULTURE W GRAM STAIN

## 2018-09-28 LAB — PHOSPHORUS: Phosphorus: 3.6 mg/dL (ref 2.5–4.6)

## 2018-09-28 LAB — CSF CULTURE: CULTURE: NO GROWTH

## 2018-09-28 LAB — MAGNESIUM: Magnesium: 1.7 mg/dL (ref 1.7–2.4)

## 2018-09-28 MED ORDER — MAGNESIUM SULFATE 4 GM/100ML IV SOLN
4.0000 g | Freq: Once | INTRAVENOUS | Status: AC
  Administered 2018-09-28: 4 g via INTRAVENOUS
  Filled 2018-09-28: qty 100

## 2018-09-28 MED ORDER — CYANOCOBALAMIN 1000 MCG/ML IJ SOLN
1000.0000 ug | Freq: Every day | INTRAMUSCULAR | Status: DC
  Administered 2018-09-28 – 2018-09-29 (×2): 1000 ug via SUBCUTANEOUS
  Filled 2018-09-28 (×2): qty 1

## 2018-09-28 MED ORDER — POTASSIUM CHLORIDE 10 MEQ/100ML IV SOLN
10.0000 meq | INTRAVENOUS | Status: AC
  Administered 2018-09-28 (×3): 10 meq via INTRAVENOUS
  Filled 2018-09-28 (×4): qty 100

## 2018-09-28 MED ORDER — THIAMINE HCL 100 MG/ML IJ SOLN
500.0000 mg | Freq: Two times a day (BID) | INTRAVENOUS | Status: DC
  Administered 2018-09-28 (×2): 500 mg via INTRAVENOUS
  Filled 2018-09-28 (×4): qty 5

## 2018-09-28 MED ORDER — POTASSIUM CHLORIDE 20 MEQ/15ML (10%) PO SOLN
40.0000 meq | Freq: Two times a day (BID) | ORAL | Status: DC
  Administered 2018-09-28 (×2): 40 meq via ORAL
  Filled 2018-09-28 (×2): qty 30

## 2018-09-28 NOTE — Progress Notes (Signed)
PROGRESS NOTE        PATIENT DETAILS Name: Wendy Edwards Age: 52 y.o. Sex: female Date of Birth: 1966/07/22 Admit Date: 09/25/2018 Admitting Physician Karmen Bongo, MD DPO:[id number], Einar Pheasant, DO  Brief Narrative: Patient is a 52 y.o. female prior history of hypertension, COPD, alcohol use-presenting with confusion, and nystagmus.  She apparently has had very poor oral intake for the past 1 year.  Suspicion for Warnicke's encephalopathy.  On IV thiamine.  Neurology following.  Subjective: Awake and alert-continues to have nystagmus.  Assessment/Plan: Wernicke's encephalopathy: Confusion has resolved, continues to have nystagmus-MRI of the brain without any acute abnormalities-however radiology suggestive of chronic alcohol use.  Continue IV thiamine for total of 5 days before transition to oral thiamine.  Awaiting vitamin B1 levels.  SIR's: Suspect secondary to dehydration rather than infection.  Blood and CSF cultures negative so far.   Hypokalemia: Suspect this is secondary to hypomagnesemia-continue replete and recheck-starting scheduled oral supplementation along with IV KCl runs today.  Recheck tomorrow.    Hypomagnesemia: Replete with 4 g of magnesium today.  Recheck levels tomorrow.  Hypophosphatemia: Repleted.  Metabolic acidosis: Secondary to starvation ketosis-resolved.  Hypertension: Controlled-continue amlodipine.  COPD: Lungs are clear-continue with as needed nebs  EtOH use: Does acknowledge alcohol use-claims she drinks every other day-3-4 bottles of beer.  No signs of withdrawal at this point.  Continue Ativan per protocol.    DVT Prophylaxis: Prophylactic Lovenox   Code Status: Full code   Family Communication: None at bedside  Disposition Plan: Remain inpatient-SNF over the weekend.  Antimicrobial agents: Anti-infectives (From admission, onward)   Start     Dose/Rate Route Frequency Ordered Stop   09/26/18 0100  vancomycin  (VANCOCIN) IVPB 750 mg/150 ml premix  Status:  Discontinued     750 mg 150 mL/hr over 60 Minutes Intravenous Every 12 hours 09/25/18 1537 09/26/18 1202   09/25/18 2200  ceFEPIme (MAXIPIME) 2 g in sodium chloride 0.9 % 100 mL IVPB  Status:  Discontinued     2 g 200 mL/hr over 30 Minutes Intravenous Every 12 hours 09/25/18 1537 09/26/18 1202   09/25/18 1500  metroNIDAZOLE (FLAGYL) IVPB 500 mg  Status:  Discontinued     500 mg 100 mL/hr over 60 Minutes Intravenous Every 8 hours 09/25/18 1428 09/26/18 1202   09/25/18 1230  vancomycin (VANCOCIN) IVPB 1000 mg/200 mL premix     1,000 mg 200 mL/hr over 60 Minutes Intravenous  Once 09/25/18 1225 09/25/18 1400   09/25/18 1230  piperacillin-tazobactam (ZOSYN) IVPB 3.375 g     3.375 g 100 mL/hr over 30 Minutes Intravenous  Once 09/25/18 1225 09/25/18 1329      Procedures: None  CONSULTS: Neurology  Time spent: 25 minutes-Greater than 50% of this time was spent in counseling, explanation of diagnosis, planning of further management, and coordination of care.  MEDICATIONS: Scheduled Meds: . amLODipine  5 mg Oral Daily  . cyanocobalamin  1,000 mcg Subcutaneous Daily  . docusate sodium  100 mg Oral BID  . enoxaparin (LOVENOX) injection  40 mg Subcutaneous Q24H  . folic acid  1 mg Oral Daily  . multivitamin with minerals  1 tablet Oral Daily  . nicotine  14 mg Transdermal Daily  . potassium chloride  40 mEq Oral BID  . sodium chloride flush  3 mL Intravenous Q12H   Continuous Infusions: .  thiamine injection 500 mg (09/28/18 1012)   PRN Meds:.acetaminophen **OR** acetaminophen, albuterol, LORazepam **OR** LORazepam, ondansetron **OR** ondansetron (ZOFRAN) IV   PHYSICAL EXAM: Vital signs: Vitals:   09/27/18 0508 09/27/18 1316 09/27/18 2202 09/28/18 0455  BP: 128/87 133/77 114/73 133/79  Pulse: (!) 112 96 (!) 108 (!) 102  Resp: 18 20 18 18   Temp: 97.6 F (36.4 C) 98.5 F (36.9 C) 98.1 F (36.7 C) 98.2 F (36.8 C)  TempSrc: Oral  Oral Oral Oral  SpO2: 100% 100% 99% 98%  Weight: 68.4 kg     Height:       Filed Weights   09/25/18 1134 09/25/18 1524 09/27/18 0508  Weight: 63 kg 66.2 kg 68.4 kg   Body mass index is 26.7 kg/m.   General appearance:Awake, alert, not in any distress.  Eyes:no scleral icterus.+ Nystagmus HEENT: Atraumatic and Normocephalic Neck: supple, no JVD. Resp:Good air entry bilaterally,no rales or rhonchi CVS: S1 S2 regular, no murmurs.  GI: Bowel sounds present, Non tender and not distended with no gaurding, rigidity or rebound. Extremities: B/L Lower Ext shows no edema, both legs are warm to touch Neurology:  Non focal Psychiatric: Normal judgment and insight. Normal mood. Musculoskeletal:No digital cyanosis Skin:No Rash, warm and dry Wounds:N/A  I have personally reviewed following labs and imaging studies  LABORATORY DATA: CBC: Recent Labs  Lab 09/25/18 1148 09/25/18 1200 09/26/18 0508  WBC 8.9  --  5.8  NEUTROABS 5.2  --   --   HGB 15.3* 15.3* 11.4*  HCT 43.4 45.0 32.2*  MCV 99.1  --  98.2  PLT 143*  --  115*    Basic Metabolic Panel: Recent Labs  Lab 09/25/18 1148 09/25/18 1200 09/26/18 0508 09/26/18 0730 09/26/18 1242 09/27/18 0347 09/27/18 1427 09/28/18 0306  NA 139 139 136  --   --  143  --  144  K 2.9* 2.8* 2.3*  --  2.7* 2.5* 2.9* 2.7*  CL 106 107 108  --   --  111  --  111  CO2 16*  --  20*  --   --  22  --  25  GLUCOSE 117* 113* 106*  --   --  94  --  92  BUN 14 15 11   --   --  <5*  --  <5*  CREATININE 0.90 0.40* 0.43*  --   --  0.41*  --  0.37*  CALCIUM 9.3  --  8.0*  --   --  8.4*  --  8.2*  MG  --   --   --  1.7  --  1.5*  --  1.7  PHOS  --   --   --  1.2*  --  2.8  --  3.6    GFR: Estimated Creatinine Clearance: 78.1 mL/min (A) (by C-G formula based on SCr of 0.37 mg/dL (L)).  Liver Function Tests: Recent Labs  Lab 09/25/18 1148 09/27/18 0347 09/28/18 0306  AST 36 22 26  ALT 40 27 25  ALKPHOS 75 60 50  BILITOT 1.1 0.5 0.3  PROT 6.7  5.6* 4.6*  ALBUMIN 3.5 3.2* 2.6*   No results for input(s): LIPASE, AMYLASE in the last 168 hours. No results for input(s): AMMONIA in the last 168 hours.  Coagulation Profile: Recent Labs  Lab 09/25/18 1148  INR 1.08    Cardiac Enzymes: No results for input(s): CKTOTAL, CKMB, CKMBINDEX, TROPONINI in the last 168 hours.  BNP (last 3 results) No results for input(s):  PROBNP in the last 8760 hours.  HbA1C: Recent Labs    09/26/18 0929  HGBA1C 4.7*    CBG: Recent Labs  Lab 09/25/18 1131  GLUCAP 123*    Lipid Profile: No results for input(s): CHOL, HDL, LDLCALC, TRIG, CHOLHDL, LDLDIRECT in the last 72 hours.  Thyroid Function Tests: Recent Labs    09/25/18 1535  TSH 0.996    Anemia Panel: Recent Labs    09/26/18 0929  VITAMINB12 319    Urine analysis:    Component Value Date/Time   COLORURINE YELLOW 09/26/2018 0721   APPEARANCEUR HAZY (A) 09/26/2018 0721   LABSPEC 1.012 09/26/2018 0721   PHURINE 6.0 09/26/2018 0721   GLUCOSEU NEGATIVE 09/26/2018 0721   HGBUR SMALL (A) 09/26/2018 0721   BILIRUBINUR NEGATIVE 09/26/2018 0721   KETONESUR 20 (A) 09/26/2018 0721   PROTEINUR NEGATIVE 09/26/2018 0721   UROBILINOGEN 0.2 11/11/2010 0327   NITRITE NEGATIVE 09/26/2018 0721   LEUKOCYTESUR MODERATE (A) 09/26/2018 0721    Sepsis Labs: Lactic Acid, Venous    Component Value Date/Time   LATICACIDVEN 1.1 09/25/2018 1923    MICROBIOLOGY: Recent Results (from the past 240 hour(s))  Blood Culture (routine x 2)     Status: None (Preliminary result)   Collection Time: 09/25/18 11:39 AM  Result Value Ref Range Status   Specimen Description BLOOD LEFT ANTECUBITAL  Final   Special Requests   Final    BOTTLES DRAWN AEROBIC AND ANAEROBIC Blood Culture adequate volume   Culture   Final    NO GROWTH 3 DAYS Performed at [facility], [place] [address] [place], [place] [address]    Report Status PENDING  Incomplete  Blood Culture (routine x 2)     Status: None  (Preliminary result)   Collection Time: 09/25/18 11:48 AM  Result Value Ref Range Status   Specimen Description BLOOD RIGHT ANTECUBITAL  Final   Special Requests   Final    BOTTLES DRAWN AEROBIC AND ANAEROBIC Blood Culture adequate volume   Culture   Final    NO GROWTH 3 DAYS Performed at [facility], [place] [address] [place], [place] [address]    Report Status PENDING  Incomplete  CSF culture     Status: None (Preliminary result)   Collection Time: 09/25/18  1:30 PM  Result Value Ref Range Status   Specimen Description CSF  Final   Special Requests NONE  Final   Gram Stain   Final    CYTOSPIN SMEAR WBC PRESENT, PREDOMINANTLY MONONUCLEAR NO ORGANISMS SEEN    Culture   Final    NO GROWTH 3 DAYS Performed at [facility], [place] [address], [place], [place] [address]    Report Status PENDING  Incomplete  Culture, Urine     Status: None   Collection Time: 09/26/18  7:30 AM  Result Value Ref Range Status   Specimen Description URINE, CLEAN CATCH  Final   Special Requests NONE  Final   Culture   Final    NO GROWTH Performed at [facility], [place] [address], [place], [place] [address]    Report Status 09/27/2018 FINAL  Final    RADIOLOGY STUDIES/RESULTS: Ct Head Wo Contrast  Result Date: 09/25/2018 CLINICAL DATA:  52 year old female with altered level of consciousness. Initial encounter. EXAM: CT HEAD WITHOUT CONTRAST TECHNIQUE: Contiguous axial images were obtained from the base of the skull through the vertex without intravenous contrast. COMPARISON:  None. FINDINGS: Brain: No intracranial hemorrhage or CT  evidence of large acute infarct. No intracranial mass lesion noted on this unenhanced exam. Vascular: No hyperdense vessel.  Minimal vascular calcifications. Skull: No skull fracture. Sinuses/Orbits: No acute orbital abnormality. Visualized paranasal sinuses are clear. Other: Minimal partial opacification inferior mastoid air cells bilaterally. Middle ear cavities  clear. No obstructing lesion of eustachian tube noted. IMPRESSION: 1. No acute intracranial abnormality noted. 2. Minimal partial opacification inferior mastoid air cells. Electronically Signed   By: Genia Del M.D.   On: 09/25/2018 12:40   Mr Jeri Cos MP Contrast  Result Date: 09/27/2018 CLINICAL DATA:  Nystagmus and memory loss. EXAM: MRI HEAD WITHOUT AND WITH CONTRAST TECHNIQUE: Multiplanar, multiecho pulse sequences of the brain and surrounding structures were obtained without and with intravenous contrast. CONTRAST:  6 mL Gadavist COMPARISON:  Head CT 09/25/2018 FINDINGS: BRAIN: There is no acute infarct, acute hemorrhage, hydrocephalus or extra-axial collection. The midline structures are normal. No midline shift or other mass effect. 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 There is hyperintense T2-weighted signal within the white matter adjacent to the third ventricle. Volume loss greater than expected for age. There is asymmetric atrophy of the superior cerebellar vermis. Susceptibility-sensitive sequences show no chronic microhemorrhage or superficial siderosis. No abnormal contrast enhancement. VASCULAR: Major intracranial arterial and venous sinus flow voids are normal. SKULL AND UPPER CERVICAL SPINE: Calvarial bone marrow signal is normal. There is no skull base mass. Visualized upper cervical spine and soft tissues are normal. SINUSES/ORBITS: Small amount of fluid in the right mastoid. Paranasal sinuses are clear. The orbits are normal. IMPRESSION: 1. No acute intracranial abnormality. 2. 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 Cerebral and cerebellar volume loss is greater than expected for age. 3. Hyperintense T2-weighted signal within the white matter surrounding the third ventricle and superior vermian cerebellar atrophy are both features of alcoholic encephalopathy. Electronically Signed   By: Ulyses Jarred M.D.   On: 09/27/2018 22:12   US Abdomen Limited  Result Date: 09/14/2018 CLINICAL DATA:   52 year old female with abdominal pain. Elevated LFTs. EXAM: ULTRASOUND ABDOMEN LIMITED RIGHT UPPER QUADRANT COMPARISON:  CT of the abdomen pelvis dated 10/03/2016 FINDINGS: Gallbladder: No gallstones or wall thickening visualized. No sonographic Murphy sign noted by sonographer. Common bile duct: Diameter: 5 mm Liver: Diffuse increased liver echogenicity most consistent with fatty infiltration. Superimposed fibrosis or inflammation not excluded. Portal vein is patent on color Doppler imaging with normal direction of blood flow towards the liver. IMPRESSION: Fatty liver otherwise unremarkable abdominal ultrasound. Electronically Signed   By: Anner Crete M.D.   On: 09/14/2018 23:39   Dg Chest Port 1 View  Result Date: 09/25/2018 CLINICAL DATA:  Cough and fever EXAM: PORTABLE CHEST 1 VIEW COMPARISON:  February 08/07/2017 FINDINGS: No edema or consolidation. The heart size and pulmonary vascularity are normal. No adenopathy. No bone lesions. IMPRESSION: No edema or consolidation. Electronically Signed   By: Lowella Grip III M.D.   On: 09/25/2018 12:31     LOS: 3 days   Oren Binet, MD  [facility]  If 7PM-7AM, please contact night-coverage  Please page via [email or web address]-Password [email or web address]-click on MD name and type text message  09/28/2018, 2:00 PM

## 2018-09-28 NOTE — Progress Notes (Signed)
[facility] has received authorization for patient once she is medically stable. Their admission liaison will come to see the patient today to complete admission paperwork to bar any weekend admission complications.   Percell Locus Yishai Rehfeld LCSW [phone]

## 2018-09-28 NOTE — Progress Notes (Signed)
MRI supportive of Wernickes. No stroke or acute abnormality. Etiology - nutritional deficiency v alcoholism. Recs as before. Follow up on baseline B1 level. Continue to supplement thiamine parenterally as recommended and then switch to PO. Nutrition and SW consults. Please call neurology with questions.  -- Amie Portland, MD [facility] Pager: [phone] If 7pm to 7am, please call on call as listed on AMION.

## 2018-09-28 NOTE — Progress Notes (Signed)
CRITICAL VALUE ALERT  Critical Value:  K=2.7  Date & Time Notied:  09-28-18 @ [phone]  Provider Notified: X. Blount, NP  Orders Received/Actions taken: potassium replacement

## 2018-09-28 NOTE — Progress Notes (Signed)
CSW and RNCM spoke with patient. She is in agreement with going to [facility] to try rehab, though she reports that she would rather return home. CSW entered her son's contact info in the chart.   Percell Locus Yunis Voorheis LCSW [phone]

## 2018-09-28 NOTE — Care Management Note (Signed)
Case Management Note  Patient Details  Name: Wendy Edwards MRN: [id number] Date of Birth: 1966/11/24  Subjective/Objective:      AMS / ? Warnicke's encephalopathy, hx of hypertension, COPD, alcohol use.  Diane Sprinkle Denman George) Hilary Hertz (Sister)    [phone]     PCP: Rosanne Gutting  Action/Plan: Transition to SNF when medically stable...CSW managing transition to facility.  Expected Discharge Date:                  Expected Discharge Plan:  Skilled Nursing Facility  In-House Referral:  Clinical Social Work  Discharge planning Services  CM Consult  Post Acute Care Choice:    Choice offered to:     Status of Service:  In process, will continue to follow  If discussed at Long Length of Stay Meetings, dates discussed:    Additional Comments:  Chelsia, Serres, RN 09/28/2018, 4:10 PM

## 2018-09-28 NOTE — Progress Notes (Signed)
Physical Therapy Treatment Patient Details Name: Wendy Edwards MRN: [id number] DOB: November 12, 1966 Today's Date: 09/28/2018    History of Present Illness Pt is a 52 year old woman admitted with AMS, malnutrition, ETOH abuse. 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 PMH: COPD, HTN, anxiety, aplastic anemia, headaches.    PT Comments    Pt making fair progress with functional mobility, but remains very limited secondary to fatigue and reports of dizziness throughout. Pt's BP was assessed in sitting (120/80) and again in standing (134/93). All VSS throughout. Pt would continue to benefit from skilled physical therapy services at this time while admitted and after d/c to address the below listed limitations in order to improve overall safety and independence with functional mobility.    Follow Up Recommendations  SNF     Equipment Recommendations  None recommended by PT;Other (comment)(defer to next venue)    Recommendations for Other Services       Precautions / Restrictions Precautions Precautions: Fall Restrictions Weight Bearing Restrictions: No    Mobility  Bed Mobility Overal bed mobility: Modified Independent             General bed mobility comments: increased time  Transfers Overall transfer level: Needs assistance Equipment used: Rolling walker (2 wheeled) Transfers: Sit to/from Stand Sit to Stand: Min assist         General transfer comment: assist for stability with transition into standing from EOB  Ambulation/Gait Ambulation/Gait assistance: Min assist Gait Distance (Feet): 2 Feet(2' forwards and backwards x2 trials) Assistive device: Rolling walker (2 wheeled) Gait Pattern/deviations: Step-to pattern;Decreased step length - right;Decreased step length - left;Decreased stride length;Shuffle Gait velocity: decreased   General Gait Details: min A for stability; pt very limited secondary to dizziness throughout   Stairs             Wheelchair Mobility     Modified Rankin (Stroke Patients Only)       Balance Overall balance assessment: Needs assistance Sitting-balance support: No upper extremity supported;Feet supported Sitting balance-Leahy Scale: Good     Standing balance support: Bilateral upper extremity supported Standing balance-Leahy Scale: Poor                              Cognition Arousal/Alertness: Awake/alert Behavior During Therapy: Flat affect Overall Cognitive Status: Within Functional Limits for tasks assessed                                 General Comments: cognition not formally assessed but [facility] for general conversation      Exercises      General Comments        Pertinent Vitals/Pain Pain Assessment: No/denies pain    Home Living                      Prior Function            PT Goals (current goals can now be found in the care plan section) Acute Rehab PT Goals PT Goal Formulation: With patient Time For Goal Achievement: 10/10/18 Potential to Achieve Goals: Good Progress towards PT goals: Progressing toward goals    Frequency    Min 3X/week      PT Plan Current plan remains appropriate    Co-evaluation              AM-PAC PT "6 Clicks" Daily Activity  Outcome Measure  Difficulty turning over in bed (including adjusting bedclothes, sheets and blankets)?: None Difficulty moving from lying on back to sitting on the side of the bed? : None Difficulty sitting down on and standing up from a chair with arms (e.g., wheelchair, bedside commode, etc,.)?: Unable Help needed moving to and from a bed to chair (including a wheelchair)?: A Little Help needed walking in hospital room?: A Little Help needed climbing 3-5 steps with a railing? : Total 6 Click Score: 16    End of Session Equipment Utilized During Treatment: Gait belt Activity Tolerance: Patient limited by fatigue Patient left: in bed;with call bell/phone within reach Nurse  Communication: Mobility status PT Visit Diagnosis: Unsteadiness on feet (R26.81);Other abnormalities of gait and mobility (R26.89);History of falling (Z91.81)     Time: 9163-8466 PT Time Calculation (min) (ACUTE ONLY): 18 min  Charges:  $Therapeutic Activity: 8-22 mins                     Sherie Don, [place], DPT  Acute Rehabilitation Services Pager [phone] Office [place] 09/28/2018, 4:58 PM

## 2018-09-29 LAB — VITAMIN B1: Vitamin B1 (Thiamine): 30.4 nmol/L — ABNORMAL LOW (ref 66.5–200.0)

## 2018-09-29 LAB — BASIC METABOLIC PANEL
Anion gap: 6 (ref 5–15)
CALCIUM: 8.5 mg/dL — AB (ref 8.9–10.3)
CO2: 28 mmol/L (ref 22–32)
Chloride: 110 mmol/L (ref 98–111)
Creatinine, Ser: 0.58 mg/dL (ref 0.44–1.00)
Glucose, Bld: 108 mg/dL — ABNORMAL HIGH (ref 70–99)
Potassium: 4.2 mmol/L (ref 3.5–5.1)
Sodium: 144 mmol/L (ref 135–145)

## 2018-09-29 LAB — MAGNESIUM: MAGNESIUM: 2 mg/dL (ref 1.7–2.4)

## 2018-09-29 LAB — PHOSPHORUS: PHOSPHORUS: 3.4 mg/dL (ref 2.5–4.6)

## 2018-09-29 MED ORDER — FOLIC ACID 1 MG PO TABS: 1.0000 mg | ORAL_TABLET | Freq: Every day | ORAL | 0 refills | Status: DC

## 2018-09-29 MED ORDER — VITAMIN B-1 100 MG PO TABS: 100.0000 mg | ORAL_TABLET | Freq: Every day | ORAL | 0 refills | Status: DC

## 2018-09-29 MED ORDER — ADULT MULTIVITAMIN W/MINERALS CH: 1.0000 | ORAL_TABLET | Freq: Every day | ORAL | 0 refills | Status: DC

## 2018-09-29 NOTE — Progress Notes (Signed)
Adamantly refuses to to SNF-says she wants to go home, she tried calling her son when I was in the room-goes directly to voicemail. She is awake and alert-she will be discharged home at her own request.

## 2018-09-29 NOTE — Progress Notes (Signed)
Nsg Discharge Note  Admit Date:  09/25/2018 Discharge date: 09/29/2018   Wendy Edwards to be discharged home with home health per MD order.  AVS completed.  Copy given to patient along with printed prescriptions.   Patient/caregiver able to verbalize understanding.  Discharge Medication: Allergies as of 09/29/2018      Reactions   Bee Venom Hives      Medication List    TAKE these medications   albuterol 1.25 MG/3ML nebulizer solution Commonly known as:  ACCUNEB TAKE 3 ML(1 VIAL) BY NEBULIZATION 3 TIMES DAILY AS NEEDED FOR WHEEZING. What changed:  See the new instructions.   PROAIR HFA 108 (90 Base) MCG/ACT inhaler Generic drug:  albuterol INHALE 2 PUFFS BY MOUTH EVERY 6 HOURS AS NEEDED What changed:    reasons to take this  additional instructions   ALPRAZolam 1 MG tablet Commonly known as:  XANAX TAKE 1 TABLET BY MOUTH THREE TIMES A DAY AS NEEDED What changed:    when to take this  reasons to take this   amLODipine 5 MG tablet Commonly known as:  NORVASC Take 1 tablet (5 mg total) by mouth daily.   ergocalciferol 50000 units capsule Commonly known as:  VITAMIN D2 Take 1 capsule (50,000 Units total) by mouth once a week.   folic acid 1 MG tablet Commonly known as:  FOLVITE Take 1 tablet (1 mg total) by mouth daily.   multivitamin with minerals Tabs tablet Take 1 tablet by mouth daily.   thiamine 100 MG tablet Commonly known as:  VITAMIN B-1 Take 1 tablet (100 mg total) by mouth daily.       Discharge Assessment: Vitals:   09/29/18 0438 09/29/18 1256  BP: 116/73 120/72  Pulse: (!) 103 (!) 112  Resp: 18 20  Temp: 98.4 F (36.9 C) 98.4 F (36.9 C)  SpO2: 99% 100%   Skin clean, dry and intact without evidence of skin break down, no evidence of skin tears noted. IV catheter discontinued intact. Site without signs and symptoms of complications. Dressing with slight pressure applied.  D/c Instructions-Education: Discharge instructions given to  patient and family with verbalized understanding. D/c education completed with patient/family including follow up instructions, medication list, d/c activities limitations if indicated, with other d/c instructions as indicated by MD - patient able to verbalize understanding, all questions fully answered. Patient instructed to return to ED, call 911, or call MD for any changes in condition.  Patient escorted to car via wheelchair by staff.   Ginette Pitman, RN 09/29/2018 2:30 PM

## 2018-09-29 NOTE — Discharge Summary (Signed)
PATIENT DETAILS Name: Wendy Edwards Age: 52 y.o. Sex: female Date of Birth: Jul 19, 1966 MRN: [id number]. Admitting Physician: Karmen Bongo, MD [id number], Einar Pheasant, DO  Admit Date: 09/25/2018 Discharge date: 09/29/2018  Recommendations for Outpatient Follow-up:  1. Follow up with PCP in 1-2 weeks 2. Please obtain BMP/CBC in one week 3. Vitamin B12 supplementation as an outpatient 4. Vitamin B1 levels pending please follow.  Admitted From:  Home  Disposition: Home with home health services (refused SNF)   Home Health:  Yes  Equipment/Devices: None  Discharge Condition: Stable  CODE STATUS: FULL CODE/  Diet recommendation:  Heart Healthy   Brief Summary: See H&P, Labs, Consult and Test reports for all details in brief, Patient is a 52 y.o. female prior history of hypertension, COPD, alcohol use-presenting with confusion, and nystagmus.  She apparently has had very poor oral intake for the past 1 year.  Suspicion for Warnicke's encephalopathy.    Started on in the timing with significant improvement-refused SNF on discharge-adamantly wants to go home.  See below for further details.  Brief Hospital Course: Wernicke's encephalopathy:  Confusion has resolved, patient is completely awake and alert, oriented x3.  MRI of the brain without any acute abnormalities-however radiology suggestive of chronic alcohol use.  Managed with IV thiamine-we will transition to oral thiamine on discharge.  Awaiting vitamin B1 levels.  SIR's: Suspect secondary to dehydration rather than infection.  Blood and CSF cultures negative so far.   Hypokalemia:  Secondary to hypomagnesemia and poor oral intake.  Repleted numerous times during this hospital stay-normal at the time of discharge.  Hypomagnesemia: Repleted  Hypophosphatemia: Repleted.  Metabolic acidosis: Secondary to starvation ketosis-resolved.  Hypertension: Controlled-continue amlodipine.  COPD: Lungs are  clear-continue with as needed nebs  EtOH use: Does acknowledge alcohol use-claims she drinks every other day-3-4 bottles of beer.  No signs of withdrawal at this point.    Managed Ativan per protocol.  Have counseled extensively-claims she is "done with drinking".  Procedures/Studies: None  Discharge Diagnoses:  Principal Problem:   Altered mental status Active Problems:   Tobacco abuse   HTN (hypertension)   Metabolic acidosis   Malnutrition (HCC)   ETOH abuse   Sepsis ([place])   COPD (chronic obstructive pulmonary disease) ([place])  Discharge Instructions:  Activity:  As tolerated with Full fall precautions use walker/cane & assistance as needed  Discharge Instructions    Diet - low sodium heart healthy   Complete by:  As directed    Discharge instructions   Complete by:  As directed    Follow with Primary MD  Luetta Nutting, DO in 2-3 days  Your Vitamin B12 levels are borderline low-you need Vitamin B12 injections-please go to your PCP's office and get these injections  AVOID ALCOHOL USE  Please get a complete blood count and chemistry panel checked by your Primary MD at your next visit, and again as instructed by your Primary MD.  Get Medicines reviewed and adjusted: Please take all your medications with you for your next visit with your Primary MD  Laboratory/radiological data: Please request your Primary MD to go over all hospital tests and procedure/radiological results at the follow up, please ask your Primary MD to get all Hospital records sent to his/her office.  In some cases, they will be blood work, cultures and biopsy results pending at the time of your discharge. Please request that your primary care M.D. follows up on these results.  Also Note the following: If you experience worsening of  your admission symptoms, develop shortness of breath, life threatening emergency, suicidal or homicidal thoughts you must seek medical attention immediately by calling 911 or  calling your MD immediately  if symptoms less severe.  You must read complete instructions/literature along with all the possible adverse reactions/side effects for all the Medicines you take and that have been prescribed to you. Take any new Medicines after you have completely understood and accpet all the possible adverse reactions/side effects.   Do not drive when taking Pain medications or sleeping medications (Benzodaizepines)  Do not take more than prescribed Pain, Sleep and Anxiety Medications. It is not advisable to combine anxiety,sleep and pain medications without talking with your primary care practitioner  Special Instructions: If you have smoked or chewed Tobacco  in the last 2 yrs please stop smoking, stop any regular Alcohol  and or any Recreational drug use.  Wear Seat belts while driving.  Please note: You were cared for by a hospitalist during your hospital stay. Once you are discharged, your primary care physician will handle any further medical issues. Please note that NO REFILLS for any discharge medications will be authorized once you are discharged, as it is imperative that you return to your primary care physician (or establish a relationship with a primary care physician if you do not have one) for your post hospital discharge needs so that they can reassess your need for medications and monitor your lab values.   Increase activity slowly   Complete by:  As directed      Allergies as of 09/29/2018      Reactions   Bee Venom Hives      Medication List    TAKE these medications   albuterol 1.25 MG/3ML nebulizer solution Commonly known as:  ACCUNEB TAKE 3 ML(1 VIAL) BY NEBULIZATION 3 TIMES DAILY AS NEEDED FOR WHEEZING. What changed:  See the new instructions.   PROAIR HFA 108 (90 Base) MCG/ACT inhaler Generic drug:  albuterol INHALE 2 PUFFS BY MOUTH EVERY 6 HOURS AS NEEDED What changed:    reasons to take this  additional instructions   ALPRAZolam 1 MG  tablet Commonly known as:  XANAX TAKE 1 TABLET BY MOUTH THREE TIMES A DAY AS NEEDED What changed:    when to take this  reasons to take this   amLODipine 5 MG tablet Commonly known as:  NORVASC Take 1 tablet (5 mg total) by mouth daily.   ergocalciferol 50000 units capsule Commonly known as:  VITAMIN D2 Take 1 capsule (50,000 Units total) by mouth once a week.   folic acid 1 MG tablet Commonly known as:  FOLVITE Take 1 tablet (1 mg total) by mouth daily.   multivitamin with minerals Tabs tablet Take 1 tablet by mouth daily.   thiamine 100 MG tablet Commonly known as:  VITAMIN B-1 Take 1 tablet (100 mg total) by mouth daily.       Contact information for follow-up providers    Luetta Nutting, DO. Schedule an appointment as soon as possible for a visit in 1 week(s).   Specialty:  Family Medicine Contact information: [place] [address] [phone]            Contact information for after-discharge care    Destination    [facility] .   Service:  Skilled Nursing Contact information: [place] [phone]  Allergies  Allergen Reactions  . Bee Venom Hives   Consultations:   neurology  Other Procedures/Studies: Ct Head Wo Contrast  Result Date: 09/25/2018 CLINICAL DATA:  52 year old female with altered level of consciousness. Initial encounter. EXAM: CT HEAD WITHOUT CONTRAST TECHNIQUE: Contiguous axial images were obtained from the base of the skull through the vertex without intravenous contrast. COMPARISON:  None. FINDINGS: Brain: No intracranial hemorrhage or CT evidence of large acute infarct. No intracranial mass lesion noted on this unenhanced exam. Vascular: No hyperdense vessel.  Minimal vascular calcifications. Skull: No skull fracture. Sinuses/Orbits: No acute orbital abnormality. Visualized paranasal sinuses are clear. Other: Minimal partial  opacification inferior mastoid air cells bilaterally. Middle ear cavities clear. No obstructing lesion of eustachian tube noted. IMPRESSION: 1. No acute intracranial abnormality noted. 2. Minimal partial opacification inferior mastoid air cells. Electronically Signed   By: Genia Del M.D.   On: 09/25/2018 12:40   Mr Jeri Cos IH Contrast  Result Date: 09/27/2018 CLINICAL DATA:  Nystagmus and memory loss. EXAM: MRI HEAD WITHOUT AND WITH CONTRAST TECHNIQUE: Multiplanar, multiecho pulse sequences of the brain and surrounding structures were obtained without and with intravenous contrast. CONTRAST:  6 mL Gadavist COMPARISON:  Head CT 09/25/2018 FINDINGS: BRAIN: There is no acute infarct, acute hemorrhage, hydrocephalus or extra-axial collection. The midline structures are normal. No midline shift or other mass effect. 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 There is hyperintense T2-weighted signal within the white matter adjacent to the third ventricle. Volume loss greater than expected for age. There is asymmetric atrophy of the superior cerebellar vermis. Susceptibility-sensitive sequences show no chronic microhemorrhage or superficial siderosis. No abnormal contrast enhancement. VASCULAR: Major intracranial arterial and venous sinus flow voids are normal. SKULL AND UPPER CERVICAL SPINE: Calvarial bone marrow signal is normal. There is no skull base mass. Visualized upper cervical spine and soft tissues are normal. SINUSES/ORBITS: Small amount of fluid in the right mastoid. Paranasal sinuses are clear. The orbits are normal. IMPRESSION: 1. No acute intracranial abnormality. 2. 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 Cerebral and cerebellar volume loss is greater than expected for age. 3. Hyperintense T2-weighted signal within the white matter surrounding the third ventricle and superior vermian cerebellar atrophy are both features of alcoholic encephalopathy. Electronically Signed   By: Ulyses Jarred M.D.   On: 09/27/2018  22:12   US Abdomen Limited  Result Date: 09/14/2018 CLINICAL DATA:  52 year old female with abdominal pain. Elevated LFTs. EXAM: ULTRASOUND ABDOMEN LIMITED RIGHT UPPER QUADRANT COMPARISON:  CT of the abdomen pelvis dated 10/03/2016 FINDINGS: Gallbladder: No gallstones or wall thickening visualized. No sonographic Murphy sign noted by sonographer. Common bile duct: Diameter: 5 mm Liver: Diffuse increased liver echogenicity most consistent with fatty infiltration. Superimposed fibrosis or inflammation not excluded. Portal vein is patent on color Doppler imaging with normal direction of blood flow towards the liver. IMPRESSION: Fatty liver otherwise unremarkable abdominal ultrasound. Electronically Signed   By: Anner Crete M.D.   On: 09/14/2018 23:39   Dg Chest Port 1 View  Result Date: 09/25/2018 CLINICAL DATA:  Cough and fever EXAM: PORTABLE CHEST 1 VIEW COMPARISON:  February 08/07/2017 FINDINGS: No edema or consolidation. The heart size and pulmonary vascularity are normal. No adenopathy. No bone lesions. IMPRESSION: No edema or consolidation. Electronically Signed   By: Lowella Grip III M.D.   On: 09/25/2018 12:31      TODAY-DAY OF DISCHARGE:  Subjective:   Ranae Plumber today has no headache,no chest abdominal pain,no new weakness tingling or numbness, feels much  better wants to go home today.   Objective:   Blood pressure 116/73, pulse (!) 103, temperature 98.4 F (36.9 C), temperature source Oral, resp. rate 18, height 5\' 3"  (1.6 m), weight 68.4 kg, SpO2 99 %.  Intake/Output Summary (Last 24 hours) at 09/29/2018 0752 Last data filed at 09/29/2018 0348 Gross per 24 hour  Intake 631.52 ml  Output 2400 ml  Net -1768.48 ml   Filed Weights   09/25/18 1134 09/25/18 1524 09/27/18 0508  Weight: 63 kg 66.2 kg 68.4 kg    Exam: Awake Alert, Oriented *3, No new F.N deficits, Normal affect [place].AT,PERRAL Supple Neck,No JVD, No cervical lymphadenopathy appriciated.  Symmetrical  Chest wall movement, Good air movement bilaterally, CTAB RRR,No Gallops,Rubs or new Murmurs, No Parasternal Heave +ve B.Sounds, Abd Soft, Non tender, No organomegaly appriciated, No rebound -guarding or rigidity. No Cyanosis, Clubbing or edema, No new Rash or bruise   PERTINENT RADIOLOGIC STUDIES: Ct Head Wo Contrast  Result Date: 09/25/2018 CLINICAL DATA:  52 year old female with altered level of consciousness. Initial encounter. EXAM: CT HEAD WITHOUT CONTRAST TECHNIQUE: Contiguous axial images were obtained from the base of the skull through the vertex without intravenous contrast. COMPARISON:  None. FINDINGS: Brain: No intracranial hemorrhage or CT evidence of large acute infarct. No intracranial mass lesion noted on this unenhanced exam. Vascular: No hyperdense vessel.  Minimal vascular calcifications. Skull: No skull fracture. Sinuses/Orbits: No acute orbital abnormality. Visualized paranasal sinuses are clear. Other: Minimal partial opacification inferior mastoid air cells bilaterally. Middle ear cavities clear. No obstructing lesion of eustachian tube noted. IMPRESSION: 1. No acute intracranial abnormality noted. 2. Minimal partial opacification inferior mastoid air cells. Electronically Signed   By: Genia Del M.D.   On: 09/25/2018 12:40   Mr Jeri Cos GY Contrast  Result Date: 09/27/2018 CLINICAL DATA:  Nystagmus and memory loss. EXAM: MRI HEAD WITHOUT AND WITH CONTRAST TECHNIQUE: Multiplanar, multiecho pulse sequences of the brain and surrounding structures were obtained without and with intravenous contrast. CONTRAST:  6 mL Gadavist COMPARISON:  Head CT 09/25/2018 FINDINGS: BRAIN: There is no acute infarct, acute hemorrhage, hydrocephalus or extra-axial collection. The midline structures are normal. No midline shift or other mass effect. 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 There is hyperintense T2-weighted signal within the white matter adjacent to the third ventricle. Volume loss greater than  expected for age. There is asymmetric atrophy of the superior cerebellar vermis. Susceptibility-sensitive sequences show no chronic microhemorrhage or superficial siderosis. No abnormal contrast enhancement. VASCULAR: Major intracranial arterial and venous sinus flow voids are normal. SKULL AND UPPER CERVICAL SPINE: Calvarial bone marrow signal is normal. There is no skull base mass. Visualized upper cervical spine and soft tissues are normal. SINUSES/ORBITS: Small amount of fluid in the right mastoid. Paranasal sinuses are clear. The orbits are normal. IMPRESSION: 1. No acute intracranial abnormality. 2. 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 Cerebral and cerebellar volume loss is greater than expected for age. 3. Hyperintense T2-weighted signal within the white matter surrounding the third ventricle and superior vermian cerebellar atrophy are both features of alcoholic encephalopathy. Electronically Signed   By: Ulyses Jarred M.D.   On: 09/27/2018 22:12   US Abdomen Limited  Result Date: 09/14/2018 CLINICAL DATA:  52 year old female with abdominal pain. Elevated LFTs. EXAM: ULTRASOUND ABDOMEN LIMITED RIGHT UPPER QUADRANT COMPARISON:  CT of the abdomen pelvis dated 10/03/2016 FINDINGS: Gallbladder: No gallstones or wall thickening visualized. No sonographic Murphy sign noted by sonographer. Common bile duct: Diameter: 5 mm Liver: Diffuse increased liver echogenicity  most consistent with fatty infiltration. Superimposed fibrosis or inflammation not excluded. Portal vein is patent on color Doppler imaging with normal direction of blood flow towards the liver. IMPRESSION: Fatty liver otherwise unremarkable abdominal ultrasound. Electronically Signed   By: Anner Crete M.D.   On: 09/14/2018 23:39   Dg Chest Port 1 View  Result Date: 09/25/2018 CLINICAL DATA:  Cough and fever EXAM: PORTABLE CHEST 1 VIEW COMPARISON:  February 08/07/2017 FINDINGS: No edema or consolidation. The heart size and pulmonary  vascularity are normal. No adenopathy. No bone lesions. IMPRESSION: No edema or consolidation. Electronically Signed   By: Lowella Grip III M.D.   On: 09/25/2018 12:31     PERTINENT LAB RESULTS: CBC: No results for input(s): WBC, HGB, HCT, PLT in the last 72 hours. CMET CMP     Component Value Date/Time   NA 144 09/29/2018 0309   NA 142 12/22/2017 1436   NA 137 03/24/2017 1113   K 4.2 09/29/2018 0309   K 3.9 12/22/2017 1436   K 4.1 03/24/2017 1113   CL 110 09/29/2018 0309   CL 105 12/22/2017 1436   CO2 28 09/29/2018 0309   CO2 27 12/22/2017 1436   CO2 23 03/24/2017 1113   GLUCOSE 108 (H) 09/29/2018 0309   GLUCOSE 104 12/22/2017 1436   BUN <5 (L) 09/29/2018 0309   BUN 6 (L) 12/22/2017 1436   BUN 11.2 03/24/2017 1113   CREATININE 0.58 09/29/2018 0309   CREATININE 0.60 08/01/2018 1406   CREATININE 0.6 12/22/2017 1436   CREATININE 0.7 03/24/2017 1113   CALCIUM 8.5 (L) 09/29/2018 0309   CALCIUM 9.7 12/22/2017 1436   CALCIUM 9.8 03/24/2017 1113   PROT 4.6 (L) 09/28/2018 0306   PROT 6.9 12/22/2017 1436   PROT 7.6 03/24/2017 1113   ALBUMIN 2.6 (L) 09/28/2018 0306   ALBUMIN 3.4 12/22/2017 1436   ALBUMIN 4.2 03/24/2017 1113   AST 26 09/28/2018 0306   AST 111 (H) 08/01/2018 1406   AST 13 03/24/2017 1113   ALT 25 09/28/2018 0306   ALT 63 (H) 08/01/2018 1406   ALT 24 12/22/2017 1436   ALT 11 03/24/2017 1113   ALKPHOS 50 09/28/2018 0306   ALKPHOS 80 12/22/2017 1436   ALKPHOS 96 03/24/2017 1113   BILITOT 0.3 09/28/2018 0306   BILITOT 0.5 08/01/2018 1406   BILITOT 0.39 03/24/2017 1113   GFRNONAA >60 09/29/2018 0309   GFRAA >60 09/29/2018 0309    GFR Estimated Creatinine Clearance: 78.1 mL/min (by C-G formula based on SCr of 0.58 mg/dL). No results for input(s): LIPASE, AMYLASE in the last 72 hours. No results for input(s): CKTOTAL, CKMB, CKMBINDEX, TROPONINI in the last 72 hours. Invalid input(s): POCBNP No results for input(s): DDIMER in the last 72 hours. Recent  Labs    09/26/18 0929  HGBA1C 4.7*   No results for input(s): CHOL, HDL, LDLCALC, TRIG, CHOLHDL, LDLDIRECT in the last 72 hours. No results for input(s): TSH, T4TOTAL, T3FREE, THYROIDAB in the last 72 hours.  Invalid input(s): FREET3 Recent Labs    09/26/18 0929  VITAMINB12 319   Coags: No results for input(s): INR in the last 72 hours.  Invalid input(s): PT Microbiology: Recent Results (from the past 240 hour(s))  Blood Culture (routine x 2)     Status: None (Preliminary result)   Collection Time: 09/25/18 11:39 AM  Result Value Ref Range Status   Specimen Description BLOOD LEFT ANTECUBITAL  Final   Special Requests   Final    BOTTLES DRAWN AEROBIC  AND ANAEROBIC Blood Culture adequate volume   Culture   Final    NO GROWTH 3 DAYS Performed at [facility], [place] [address] [place], [place] [address]    Report Status PENDING  Incomplete  Blood Culture (routine x 2)     Status: None (Preliminary result)   Collection Time: 09/25/18 11:48 AM  Result Value Ref Range Status   Specimen Description BLOOD RIGHT ANTECUBITAL  Final   Special Requests   Final    BOTTLES DRAWN AEROBIC AND ANAEROBIC Blood Culture adequate volume   Culture   Final    NO GROWTH 3 DAYS Performed at [facility], [place] [address], [place], [place] [address]    Report Status PENDING  Incomplete  CSF culture     Status: None   Collection Time: 09/25/18  1:30 PM  Result Value Ref Range Status   Specimen Description CSF  Final   Special Requests NONE  Final   Gram Stain   Final    CYTOSPIN SMEAR WBC PRESENT, PREDOMINANTLY MONONUCLEAR NO ORGANISMS SEEN    Culture   Final    NO GROWTH 3 DAYS Performed at [facility], [place] [address] [place], [place] [address]    Report Status 09/28/2018 FINAL  Final  Culture, Urine     Status: None   Collection Time: 09/26/18  7:30 AM  Result Value Ref Range Status   Specimen Description URINE, CLEAN CATCH  Final   Special Requests NONE  Final    Culture   Final    NO GROWTH Performed at [facility], [place] [address] [place], [place] [address]    Report Status 09/27/2018 FINAL  Final    FURTHER DISCHARGE INSTRUCTIONS:  Get Medicines reviewed and adjusted: Please take all your medications with you for your next visit with your Primary MD  Laboratory/radiological data: Please request your Primary MD to go over all hospital tests and procedure/radiological results at the follow up, please ask your Primary MD to get all Hospital records sent to his/her office.  In some cases, they will be blood work, cultures and biopsy results pending at the time of your discharge. Please request that your primary care M.D. goes through all the records of your hospital data and follows up on these results.  Also Note the following: If you experience worsening of your admission symptoms, develop shortness of breath, life threatening emergency, suicidal or homicidal thoughts you must seek medical attention immediately by calling 911 or calling your MD immediately  if symptoms less severe.  You must read complete instructions/literature along with all the possible adverse reactions/side effects for all the Medicines you take and that have been prescribed to you. Take any new Medicines after you have completely understood and accpet all the possible adverse reactions/side effects.   Do not drive when taking Pain medications or sleeping medications (Benzodaizepines)  Do not take more than prescribed Pain, Sleep and Anxiety Medications. It is not advisable to combine anxiety,sleep and pain medications without talking with your primary care practitioner  Special Instructions: If you have smoked or chewed Tobacco  in the last 2 yrs please stop smoking, stop any regular Alcohol  and or any Recreational drug use.  Wear Seat belts while driving.  Please note: You were cared for by a hospitalist during your hospital stay. Once you are discharged, your  primary care physician will handle any further medical issues. Please note that NO REFILLS for any discharge  medications will be authorized once you are discharged, as it is imperative that you return to your primary care physician (or establish a relationship with a primary care physician if you do not have one) for your post hospital discharge needs so that they can reassess your need for medications and monitor your lab values.  Total Time spent coordinating discharge including counseling, education and face to face time equals 35 minutes.  SignedOren Binet 09/29/2018 7:52 AM

## 2018-09-29 NOTE — Care Management Note (Signed)
Case Management Note  Patient Details  Name: Wendy Edwards MRN: [id number] Date of Birth: Oct 04, 1966  Subjective/Objective:    For dc today, refusing SNF, chose [facility] from [facility], referral given to [facility] for [place], [place], [place]. Soc will begin 24 to 48 hrs post dc.                 Action/Plan: DC home when ready.  Expected Discharge Date:  09/29/18               Expected Discharge Plan:  [place]  In-House Referral:  Clinical Social Work  Discharge planning Services  CM Consult  Post Acute Care Choice:  Home Health Choice offered to:  Patient  DME Arranged:    DME Agency:     HH Arranged:  RN, PT, OT, Nurse's Aide [place] Agency:  [facility]  Status of Service:  Completed, signed off  If discussed at [place] of Stay Meetings, dates discussed:    Additional Comments:  Zenon Mayo, RN 09/29/2018, 9:58 AM

## 2018-09-29 NOTE — Progress Notes (Signed)
CSW noting per MD that patient is refusing SNF and is being discharged home. CSW signing off.  Laveda Abbe, [place] Clinical Social Worker [phone]

## 2018-09-30 ENCOUNTER — Other Ambulatory Visit: Payer: Self-pay | Admitting: Family

## 2018-09-30 LAB — CULTURE, BLOOD (ROUTINE X 2)
CULTURE: NO GROWTH
Culture: NO GROWTH
Special Requests: ADEQUATE
Special Requests: ADEQUATE

## 2018-10-01 ENCOUNTER — Telehealth: Payer: Self-pay | Admitting: Behavioral Health

## 2018-10-01 NOTE — Telephone Encounter (Signed)
Unable to reach patient at time of TCM/Hospital Follow-up call. Left message for patient to return call when available.  

## 2018-10-02 NOTE — Telephone Encounter (Signed)
Attempted to reach patient for TCM/Hospital Follow-up call x 2; no answer. Left message for a call back.

## 2018-10-03 NOTE — Telephone Encounter (Signed)
Unable to reach patient x 3. Left message for patient to return call when available.

## 2018-10-04 ENCOUNTER — Telehealth: Payer: Self-pay | Admitting: Family Medicine

## 2018-10-04 NOTE — Telephone Encounter (Signed)
Copied from [place] [phone]. Topic: General - Other >> Oct 04, 2018  4:05 PM Cecelia Byars, NT wrote: Reason for CRM: [facility] says they are unable to reach  the patient and after multiple attempts,they are not going to care for her

## 2018-10-04 NOTE — Telephone Encounter (Signed)
Unable to reach patient at time of TCM Call x 4. Left message for patient to return call when available.

## 2018-10-04 NOTE — Telephone Encounter (Signed)
FYI

## 2018-10-08 ENCOUNTER — Other Ambulatory Visit: Payer: Self-pay | Admitting: Hematology & Oncology

## 2018-10-11 DIAGNOSIS — R413 Other amnesia: Secondary | ICD-10-CM | POA: Diagnosis not present

## 2018-10-11 DIAGNOSIS — I10 Essential (primary) hypertension: Secondary | ICD-10-CM | POA: Diagnosis not present

## 2018-10-11 DIAGNOSIS — H5509 Other forms of nystagmus: Secondary | ICD-10-CM | POA: Diagnosis not present

## 2018-10-11 DIAGNOSIS — H499 Unspecified paralytic strabismus: Secondary | ICD-10-CM | POA: Diagnosis not present

## 2018-10-11 DIAGNOSIS — Z86718 Personal history of other venous thrombosis and embolism: Secondary | ICD-10-CM | POA: Diagnosis not present

## 2018-10-11 DIAGNOSIS — E46 Unspecified protein-calorie malnutrition: Secondary | ICD-10-CM | POA: Diagnosis not present

## 2018-10-11 DIAGNOSIS — E512 Wernicke's encephalopathy: Secondary | ICD-10-CM | POA: Diagnosis not present

## 2018-10-11 DIAGNOSIS — Z96651 Presence of right artificial knee joint: Secondary | ICD-10-CM | POA: Diagnosis not present

## 2018-10-11 DIAGNOSIS — J449 Chronic obstructive pulmonary disease, unspecified: Secondary | ICD-10-CM | POA: Diagnosis not present

## 2018-10-11 NOTE — Telephone Encounter (Signed)
Copied from [place] [id number]. Topic: General - Other >> Oct 11, 2018  9:18 AM Leward Quan A wrote: Reason for CRM: Donita nurse with [facility] called to request information. She would like to know if Dr Zigmund Daniel will still follow patient and be willing to sign orders when needed. Please advise Ph# [phone]   Spoke with Donita with [facility] and she has made an appointment for patient  10/12/2018 for hospital follow up with PCP. Donita would also like orders for home health aide twice a week for three weeks and also a Environmental consultant.

## 2018-10-12 ENCOUNTER — Inpatient Hospital Stay: Payer: Medicare Other | Admitting: Family Medicine

## 2018-10-18 ENCOUNTER — Telehealth: Payer: Self-pay

## 2018-10-18 DIAGNOSIS — E512 Wernicke's encephalopathy: Secondary | ICD-10-CM | POA: Diagnosis not present

## 2018-10-18 DIAGNOSIS — I10 Essential (primary) hypertension: Secondary | ICD-10-CM | POA: Diagnosis not present

## 2018-10-18 DIAGNOSIS — Z96651 Presence of right artificial knee joint: Secondary | ICD-10-CM | POA: Diagnosis not present

## 2018-10-18 DIAGNOSIS — H499 Unspecified paralytic strabismus: Secondary | ICD-10-CM | POA: Diagnosis not present

## 2018-10-18 DIAGNOSIS — E46 Unspecified protein-calorie malnutrition: Secondary | ICD-10-CM | POA: Diagnosis not present

## 2018-10-18 DIAGNOSIS — J449 Chronic obstructive pulmonary disease, unspecified: Secondary | ICD-10-CM | POA: Diagnosis not present

## 2018-10-18 DIAGNOSIS — R413 Other amnesia: Secondary | ICD-10-CM | POA: Diagnosis not present

## 2018-10-18 DIAGNOSIS — H5509 Other forms of nystagmus: Secondary | ICD-10-CM | POA: Diagnosis not present

## 2018-10-18 DIAGNOSIS — Z86718 Personal history of other venous thrombosis and embolism: Secondary | ICD-10-CM | POA: Diagnosis not present

## 2018-10-18 NOTE — Telephone Encounter (Signed)
Left a VM for patient and PT regarding PCP recommendations and to make a f/u apointment.

## 2018-10-18 NOTE — Telephone Encounter (Signed)
Recommend continuing to monitor if asymptomatic.  She needs f/u with me.

## 2018-10-18 NOTE — Telephone Encounter (Signed)
Copied from [place] [id number]. Topic: General - Other >> Oct 18, 2018 11:22 AM Virl Axe D wrote: Reason for CRM: Juluis Rainier / Claiborne Billings, Pt's Physical Therapist states Pt's Heart Rate was very high when she saw her today / 130 at rest. She was sitting in the bed during this time. Can contact Claiborne Billings if needed [phone]. Please advise  Message sent to incorrect practice. Re Sent to Luetta Nutting DO at [facility]

## 2018-10-22 NOTE — Telephone Encounter (Signed)
Called and left patient a VM to call and set up a follow up appointment at her earliest convenience

## 2018-11-10 ENCOUNTER — Other Ambulatory Visit: Payer: Self-pay | Admitting: Family

## 2018-11-14 ENCOUNTER — Other Ambulatory Visit: Payer: Self-pay | Admitting: Family

## 2018-12-03 ENCOUNTER — Telehealth: Payer: Self-pay

## 2018-12-03 ENCOUNTER — Other Ambulatory Visit: Payer: Self-pay | Admitting: Family

## 2018-12-03 NOTE — Telephone Encounter (Signed)
Copied from [place] [id number]. Topic: General - Other >> Dec 03, 2018 12:51 PM Ivar Drape wrote: Reason for CRM:   Almyra Free w/Home [facility] [phone] stated she faxed an order on 11/30/18 for incontinence supplies.  It needs to be signed and faxed back to (F) [phone].  She wants to know if that fax was received. She said they can also take a verbal over the phone if needed.

## 2018-12-04 NOTE — Telephone Encounter (Signed)
Faxed order to Home Care-patient needs to follow up with Dr. Zigmund Daniel.

## 2018-12-23 ENCOUNTER — Other Ambulatory Visit: Payer: Self-pay | Admitting: Family

## 2019-01-13 ENCOUNTER — Emergency Department (HOSPITAL_COMMUNITY)
Admission: EM | Admit: 2019-01-13 | Discharge: 2019-01-14 | Disposition: A | Discharge status: Home / Self Care | Payer: Medicare Other | Attending: Emergency Medicine | Admitting: Emergency Medicine

## 2019-01-13 ENCOUNTER — Emergency Department (HOSPITAL_COMMUNITY): Payer: Medicare Other

## 2019-01-13 ENCOUNTER — Other Ambulatory Visit: Payer: Self-pay

## 2019-01-13 ENCOUNTER — Encounter (HOSPITAL_COMMUNITY): Payer: Self-pay | Admitting: Emergency Medicine

## 2019-01-13 DIAGNOSIS — I10 Essential (primary) hypertension: Secondary | ICD-10-CM | POA: Insufficient documentation

## 2019-01-13 DIAGNOSIS — E876 Hypokalemia: Secondary | ICD-10-CM

## 2019-01-13 DIAGNOSIS — K5900 Constipation, unspecified: Secondary | ICD-10-CM | POA: Insufficient documentation

## 2019-01-13 DIAGNOSIS — R109 Unspecified abdominal pain: Secondary | ICD-10-CM | POA: Diagnosis not present

## 2019-01-13 DIAGNOSIS — R339 Retention of urine, unspecified: Secondary | ICD-10-CM

## 2019-01-13 DIAGNOSIS — R079 Chest pain, unspecified: Secondary | ICD-10-CM | POA: Diagnosis not present

## 2019-01-13 DIAGNOSIS — R0902 Hypoxemia: Secondary | ICD-10-CM | POA: Diagnosis not present

## 2019-01-13 DIAGNOSIS — Z79899 Other long term (current) drug therapy: Secondary | ICD-10-CM | POA: Diagnosis not present

## 2019-01-13 DIAGNOSIS — R1013 Epigastric pain: Secondary | ICD-10-CM | POA: Diagnosis not present

## 2019-01-13 DIAGNOSIS — R000 Tachycardia, unspecified: Secondary | ICD-10-CM | POA: Diagnosis not present

## 2019-01-13 DIAGNOSIS — R197 Diarrhea, unspecified: Secondary | ICD-10-CM | POA: Diagnosis not present

## 2019-01-13 DIAGNOSIS — R1084 Generalized abdominal pain: Secondary | ICD-10-CM

## 2019-01-13 DIAGNOSIS — K449 Diaphragmatic hernia without obstruction or gangrene: Secondary | ICD-10-CM | POA: Diagnosis not present

## 2019-01-13 DIAGNOSIS — J449 Chronic obstructive pulmonary disease, unspecified: Secondary | ICD-10-CM | POA: Diagnosis not present

## 2019-01-13 DIAGNOSIS — F17210 Nicotine dependence, cigarettes, uncomplicated: Secondary | ICD-10-CM | POA: Diagnosis not present

## 2019-01-13 DIAGNOSIS — R531 Weakness: Secondary | ICD-10-CM

## 2019-01-13 LAB — URINALYSIS, ROUTINE W REFLEX MICROSCOPIC
BACTERIA UA: NONE SEEN
Bilirubin Urine: NEGATIVE
Glucose, UA: NEGATIVE mg/dL
Hgb urine dipstick: NEGATIVE
KETONES UR: 80 mg/dL — AB
Leukocytes, UA: NEGATIVE
NITRITE: POSITIVE — AB
PROTEIN: 30 mg/dL — AB
Specific Gravity, Urine: 1.02 (ref 1.005–1.030)
pH: 6 (ref 5.0–8.0)

## 2019-01-13 LAB — CBC WITH DIFFERENTIAL/PLATELET
Abs Immature Granulocytes: 0.03 10*3/uL (ref 0.00–0.07)
BASOS ABS: 0.1 10*3/uL (ref 0.0–0.1)
Basophils Relative: 1 %
EOS PCT: 1 %
Eosinophils Absolute: 0.1 10*3/uL (ref 0.0–0.5)
HEMATOCRIT: 40 % (ref 36.0–46.0)
HEMOGLOBIN: 12.7 g/dL (ref 12.0–15.0)
Immature Granulocytes: 0 %
LYMPHS ABS: 1.5 10*3/uL (ref 0.7–4.0)
LYMPHS PCT: 20 %
MCH: 32.1 pg (ref 26.0–34.0)
MCHC: 31.8 g/dL (ref 30.0–36.0)
MCV: 101 fL — AB (ref 80.0–100.0)
MONO ABS: 1.1 10*3/uL — AB (ref 0.1–1.0)
MONOS PCT: 15 %
NEUTROS ABS: 4.8 10*3/uL (ref 1.7–7.7)
Neutrophils Relative %: 63 %
Platelets: 287 10*3/uL (ref 150–400)
RBC: 3.96 MIL/uL (ref 3.87–5.11)
RDW: 17 % — ABNORMAL HIGH (ref 11.5–15.5)
WBC: 7.5 10*3/uL (ref 4.0–10.5)
nRBC: 0 % (ref 0.0–0.2)

## 2019-01-13 LAB — TYPE AND SCREEN
ABO/RH(D): O POS
ANTIBODY SCREEN: NEGATIVE

## 2019-01-13 LAB — COMPREHENSIVE METABOLIC PANEL
ALT: 10 U/L (ref 0–44)
AST: 12 U/L — AB (ref 15–41)
Albumin: 3 g/dL — ABNORMAL LOW (ref 3.5–5.0)
Alkaline Phosphatase: 72 U/L (ref 38–126)
Anion gap: 14 (ref 5–15)
BUN: 6 mg/dL (ref 6–20)
CO2: 12 mmol/L — AB (ref 22–32)
CREATININE: 0.44 mg/dL (ref 0.44–1.00)
Calcium: 8.6 mg/dL — ABNORMAL LOW (ref 8.9–10.3)
Chloride: 115 mmol/L — ABNORMAL HIGH (ref 98–111)
GFR calc Af Amer: >60 mL/min (ref >60)
Glucose, Bld: 91 mg/dL (ref 70–99)
Potassium: 3.1 mmol/L — ABNORMAL LOW (ref 3.5–5.1)
Sodium: 141 mmol/L (ref 135–145)
Total Bilirubin: 1.1 mg/dL (ref 0.3–1.2)
Total Protein: 6.1 g/dL — ABNORMAL LOW (ref 6.5–8.1)

## 2019-01-13 LAB — PROTIME-INR
INR: 1.15
Prothrombin Time: 14.6 seconds (ref 11.4–15.2)

## 2019-01-13 LAB — ETHANOL

## 2019-01-13 LAB — LIPASE, BLOOD: LIPASE: 22 U/L (ref 11–51)

## 2019-01-13 LAB — AMMONIA: Ammonia: 30 umol/L (ref 9–35)

## 2019-01-13 LAB — TROPONIN I: Troponin I: <0.03 ng/mL (ref <0.03)

## 2019-01-13 LAB — LACTIC ACID, PLASMA: LACTIC ACID, VENOUS: 1.5 mmol/L (ref 0.5–1.9)

## 2019-01-13 LAB — POC OCCULT BLOOD, ED: FECAL OCCULT BLD: POSITIVE — AB

## 2019-01-13 MED ORDER — POLYETHYLENE GLYCOL 3350 17 G PO PACK: 17.0000 g | PACK | Freq: Every day | ORAL | 0 refills | Status: DC

## 2019-01-13 MED ORDER — POTASSIUM CHLORIDE CRYS ER 20 MEQ PO TBCR
40.0000 meq | EXTENDED_RELEASE_TABLET | Freq: Once | ORAL | Status: AC
  Administered 2019-01-13: 40 meq via ORAL
  Filled 2019-01-13: qty 2

## 2019-01-13 MED ORDER — IOPAMIDOL (ISOVUE-300) INJECTION 61%
100.0000 mL | Freq: Once | INTRAVENOUS | Status: AC | PRN
  Administered 2019-01-13: 100 mL via INTRAVENOUS

## 2019-01-13 MED ORDER — IOPAMIDOL (ISOVUE-300) INJECTION 61%
INTRAVENOUS | Status: AC
  Filled 2019-01-13: qty 100

## 2019-01-13 MED ORDER — SODIUM CHLORIDE 0.9 % IV BOLUS
1000.0000 mL | Freq: Once | INTRAVENOUS | Status: AC
  Administered 2019-01-13: 1000 mL via INTRAVENOUS

## 2019-01-13 MED ORDER — DOCUSATE SODIUM 100 MG PO CAPS: 100.0000 mg | ORAL_CAPSULE | Freq: Two times a day (BID) | ORAL | 0 refills | Status: DC | PRN

## 2019-01-13 MED ORDER — SODIUM CHLORIDE (PF) 0.9 % IJ SOLN
INTRAMUSCULAR | Status: AC
  Filled 2019-01-13: qty 50

## 2019-01-13 NOTE — ED Notes (Signed)
Bed: CV81 Expected date:  Expected time:  Means of arrival:  Comments: 53 yo constipation

## 2019-01-13 NOTE — ED Provider Notes (Signed)
[facility] DEPT Provider Note   CSN: [id number] Arrival date & time: 01/13/19  1750     History   Chief Complaint Chief Complaint  Patient presents with  . Constipation    HPI Wendy Edwards is a 53 y.o. female.  She is presenting by ambulance for various complaints.  She said she has been feeling generally weak for 4 days with decreased appetite.  She said she is been drinking fluids.  She has been constipated but feels like she still passing some liquid stool but has had some rectal pain with it.  Is also complaining of some vague slight chest pain and some epigastric pain.  She says she drinks alcohol but does not get sick when she does not drink.  EMS gave her some Zofran and fluids along with a sublingual nitro.  It sounds like maybe the chest pain improved with that.  No cough no shortness of breath no fevers.  No urinary symptoms.  The history is provided by the patient.  Abdominal Pain  Pain location:  Epigastric Pain quality: cramping   Pain radiates to:  Does not radiate Pain severity:  Mild Onset quality:  Gradual Timing:  Intermittent Progression:  Unchanged Chronicity:  New Context: alcohol use   Context: not trauma   Relieved by:  Nothing Worsened by:  Nothing Ineffective treatments:  None tried Associated symptoms: anorexia, chest pain, constipation, diarrhea, fatigue and nausea   Associated symptoms: no dysuria, no fever, no hematemesis, no hematochezia, no hematuria, no shortness of breath, no sore throat, no vaginal bleeding, no vaginal discharge and no vomiting     Past Medical History:  Diagnosis Date  . 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 Aplastic anemia ([place])    hx/notes 05/09/2007  . Arthritis    "right knee" (02/21/2017)  . Asthma   . Chronic anxiety    Archie Endo 02/04/2016  . COPD (chronic obstructive pulmonary disease) ([place])    hx/notes  05/09/2007  . Daily headache   . Depression   . DVT (deep venous thrombosis) ([facility])    hx upper and lower extremeties/notes 05/09/2007  . GERD (gastroesophageal reflux disease)   . Heart murmur 1974  . History of blood transfusion    "related to my aplastic anemia"  . History of kidney stones   . Hypertension   . Migraine    "a few/year" (02/21/2017)  . Pneumonia 1974   hospitalized "w/double pneumonia"  . Serum sickness    hx/notes 05/09/2007  . Transaminitis    asymptomatic hx/notes 05/09/2007    Patient Active Problem List   Diagnosis Date Noted  . Altered mental status 09/25/2018  . Metabolic acidosis 51/70/0174  . Malnutrition ([place]) 09/25/2018  . ETOH abuse 09/25/2018  . Sepsis ([place]) 09/25/2018  . COPD (chronic obstructive pulmonary disease) ([place]) 09/25/2018  . Chronic bilateral low back pain without sciatica 07/11/2018  . Screening for breast cancer 07/11/2018  . 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 Pain due to knee joint prosthesis ([place]), right 02/16/2017  . Hx of aplastic anemia 10/31/2012  . HTN (hypertension) 06/19/2012  . Tobacco abuse 05/18/2012    Past Surgical History:  Procedure Laterality Date  . CYSTOSCOPY W/ URETEROSCOPY W/ LITHOTRIPSY    . EXTREMITY CYST EXCISION Left 2017   upper, inner arm  . KNEE ARTHROSCOPY Right 1985  . MEDIAL PARTIAL KNEE REPLACEMENT Right 2005  . TOTAL ABDOMINAL HYSTERECTOMY  2003   hx/notes 05/09/2007  . TOTAL KNEE REVISION Right 02/20/2017   Procedure: TOTAL KNEE REVISION;  Surgeon: Frederik Pear, MD;  Location: [facility];  Service: Orthopedics;  Laterality: Right;  . TUBAL LIGATION    . TUNNELED VENOUS CATHETER PLACEMENT Right    hx/notes 05/09/2007; "took it out ~ 1 wk later"     OB History   No obstetric history on file.      Home Medications    Prior to Admission medications   Medication Sig Start Date End Date Taking? Authorizing Provider  albuterol (ACCUNEB) 1.25 MG/3ML nebulizer solution TAKE 3 ML(1 VIAL) BY  NEBULIZATION 3 TIMES DAILY AS NEEDED FOR WHEEZING. 10/01/18   [place], Holli Humbles, NP  ALPRAZolam Duanne Moron) 1 MG tablet TAKE 1 TABLET BY MOUTH THREE TIMES A DAY AS NEEDED 11/14/18   [place], Holli Humbles, NP  amLODipine (NORVASC) 5 MG tablet Take 1 tablet (5 mg total) by mouth daily. 07/11/18   Luetta Nutting, DO  ergocalciferol (VITAMIN D2) 50000 units capsule Take 1 capsule (50,000 Units total) by mouth once a week. 08/02/18   [place], Holli Humbles, NP  folic acid (FOLVITE) 1 MG tablet Take 1 tablet (1 mg total) by mouth daily. 09/29/18   Ghimire, Henreitta Leber, MD  Multiple Vitamin (MULTIVITAMIN WITH MINERALS) TABS tablet Take 1 tablet by mouth daily. 09/29/18   Jonetta Osgood, MD  PROAIR HFA 108 [phone] Base) MCG/ACT inhaler INHALE 2 PUFFS BY MOUTH EVERY 6 HOURS AS NEEDED 12/24/18   [place], Holli Humbles, NP  thiamine (VITAMIN B-1) 100 MG tablet Take 1 tablet (100 mg total) by mouth daily. 09/29/18   Ghimire, Henreitta Leber, MD    Family History Family History  Problem Relation Age of Onset  . Coronary artery disease Father 43       CABG  . Cancer Father   . Hypertension Mother   . Cancer Mother     Social History Social History   Tobacco Use  . Smoking status: Current Every Day Smoker    Packs/day: 0.50    Years: 34.00    Pack years: 17.00    Types: Cigarettes  . Smokeless tobacco: Never Used  Substance Use Topics  . Alcohol use: Yes    Alcohol/week: 12.0 standard drinks    Types: 12 Cans of beer per week    Comment: previously a heavy drinker - 6-7 beers daily; last drank maybe a week ago  . Drug use: No     Allergies   Bee venom   Review of Systems Review of Systems  Constitutional: Positive for appetite change and fatigue. Negative for fever.  HENT: Negative for sore throat.   Eyes: Negative for visual disturbance.  Respiratory: Negative for shortness of breath.   Cardiovascular: Positive for chest pain.  Gastrointestinal: Positive for abdominal pain, anorexia, constipation,  diarrhea and nausea. Negative for hematemesis, hematochezia and vomiting.  Genitourinary: Negative for dysuria, hematuria, vaginal bleeding and vaginal discharge.  Musculoskeletal: Positive for gait problem (unsteady).  Skin: Negative for rash.  Neurological: Positive for light-headedness. Negative for headaches.     Physical Exam Updated Vital Signs BP 119/87 (BP Location: Left Arm)   Pulse (!) 128   Temp 97.9 F (36.6 C) (Oral)   Resp 16   SpO2 100%   Physical Exam Vitals signs and nursing note reviewed.  Constitutional:      General: She is not in acute distress.    Appearance: She is well-developed.  HENT:  Head: Normocephalic and atraumatic.  Eyes:     Conjunctiva/sclera: Conjunctivae normal.  Neck:     Musculoskeletal: Neck supple.  Cardiovascular:     Rate and Rhythm: Regular rhythm. Tachycardia present.     Pulses: Normal pulses.     Heart sounds: No murmur.  Pulmonary:     Effort: Pulmonary effort is normal. No respiratory distress.     Breath sounds: Normal breath sounds.  Abdominal:     Palpations: Abdomen is soft.     Tenderness: There is no abdominal tenderness. There is no guarding or rebound.  Musculoskeletal:        General: No tenderness.     Right lower leg: No edema.     Left lower leg: No edema.  Skin:    General: Skin is warm and dry.     Capillary Refill: Capillary refill takes less than 2 seconds.  Neurological:     Mental Status: She is alert and oriented to person, place, and time.     Sensory: No sensory deficit.     Motor: No weakness.      ED Treatments / Results  Labs (all labs ordered are listed, but only abnormal results are displayed) Labs Reviewed  COMPREHENSIVE METABOLIC PANEL - Abnormal; Notable for the following components:      Result Value   Potassium 3.1 (*)    Chloride 115 (*)    CO2 12 (*)    Calcium 8.6 (*)    Total Protein 6.1 (*)    Albumin 3.0 (*)    AST 12 (*)    All other components within normal  limits  CBC WITH DIFFERENTIAL/PLATELET - Abnormal; Notable for the following components:   MCV 101.0 (*)    RDW 17.0 (*)    Monocytes Absolute 1.1 (*)    All other components within normal limits  URINALYSIS, ROUTINE W REFLEX MICROSCOPIC - Abnormal; Notable for the following components:   Ketones, ur 80 (*)    Protein, ur 30 (*)    Nitrite POSITIVE (*)    All other components within normal limits  POC OCCULT BLOOD, ED - Abnormal; Notable for the following components:   Fecal Occult Bld POSITIVE (*)    All other components within normal limits  LIPASE, BLOOD  TROPONIN I  AMMONIA  ETHANOL  LACTIC ACID, PLASMA  PROTIME-INR  TYPE AND SCREEN    EKG None  Radiology Ct Abdomen Pelvis W Contrast  Result Date: 01/13/2019 CLINICAL DATA:  Constipation and urine retention.  Epigastric pain. EXAM: CT ABDOMEN AND PELVIS WITH CONTRAST TECHNIQUE: Multidetector CT imaging of the abdomen and pelvis was performed using the standard protocol following bolus administration of intravenous contrast. CONTRAST:  14mL ISOVUE-300 IOPAMIDOL (ISOVUE-300) INJECTION 61% COMPARISON:  Body CT 10/03/2016 FINDINGS: Lower chest: Partially visualized moderate hiatal hernia. Hepatobiliary: No focal liver abnormality is seen. No gallstones, gallbladder wall thickening, or biliary dilatation. Pancreas: Unremarkable. No pancreatic ductal dilatation or surrounding inflammatory changes. Spleen: Normal in size without focal abnormality. Adrenals/Urinary Tract: Adrenal glands are unremarkable. Kidneys are normal, without renal calculi, focal lesion, or hydronephrosis. Marked distention of the urinary bladder, which extends superiorly to the level of the umbilicus. Stomach/Bowel: Stomach is within normal limits. No evidence of appendicitis. No evidence of small bowel wall thickening, distention, or inflammatory changes. The rectum is distended by formed stool and measures 7 cm transversely. The remainder of the colon is  decompressed. Vascular/Lymphatic: Aortic atherosclerosis. No enlarged abdominal or pelvic lymph nodes. Reproductive: Status post hysterectomy.  No adnexal masses. Other: No abdominal wall hernia or abnormality. No abdominopelvic ascites. Musculoskeletal: No acute or significant osseous findings. IMPRESSION: Marked distention of the urinary bladder, which extends superiorly to the level of the umbilicus. The rectum is distended by formed stool and measures 7 cm transversely. The remainder of the colon is decompressed. Partially visualized moderate hiatal hernia. No evidence of acute abnormalities within the solid abdominal organs. Calcific atherosclerotic disease of the aorta. Electronically Signed   By: Fidela Salisbury M.D.   On: 01/13/2019 20:20    Procedures Procedures (including critical care time)  Medications Ordered in ED Medications  iopamidol (ISOVUE-300) 61 % injection (has no administration in time range)  sodium chloride (PF) 0.9 % injection (has no administration in time range)  sodium chloride 0.9 % bolus 1,000 mL (1,000 mLs Intravenous New Bag/Given 01/13/19 2250)  sodium chloride 0.9 % bolus 1,000 mL (0 mLs Intravenous Stopped 01/13/19 1952)  iopamidol (ISOVUE-300) 61 % injection 100 mL (100 mLs Intravenous Contrast Given 01/13/19 1959)  potassium chloride SA (K-DUR,KLOR-CON) CR tablet 40 mEq (40 mEq Oral Given 01/13/19 2303)     Initial Impression / Assessment and Plan / ED Course  I have reviewed the triage vital signs and the nursing notes.  Pertinent labs & imaging results that were available during my care of the patient were reviewed by me and considered in my medical decision making (see chart for details).  Clinical Course as of Jan 13 2325  Nancy Fetter Jan 13, 2019  1857 Rectal exam done.  Normal tone normal sensation.  She is got some mostly soft stool in vault dark color.  Too high for manual disimpaction. Sample sent for guaiac.Chaperone was present during exam.    [MB]    2144 Patient was unable to urinate on her own.  We did an catheter and they had over a liter urine out.  They are giving her a enema now.  Will reevaluate to see if needs admission or not.   [MB]  2242 Patient's tried an enema with minimal relief.  She says otherwise she still feeling better.  We talked about her going home on some oral laxatives and she seems comfortable with that.  Heart rate still mildly up around 110s.   [MB]    Clinical Course User Index [MB] Hayden Rasmussen, MD     Final Clinical Impressions(s) / ED Diagnoses   Final diagnoses:  Generalized abdominal pain  Constipation, unspecified constipation type  Urinary retention  Generalized weakness  Hypokalemia    ED Discharge Orders         Ordered    docusate sodium (COLACE) 100 MG capsule  2 times daily PRN     01/13/19 2249    polyethylene glycol (MIRALAX) packet  Daily     01/13/19 2249           Hayden Rasmussen, MD 01/14/19 1342

## 2019-01-13 NOTE — Discharge Instructions (Addendum)
You are seen in the emergency department for feeling generalized weakness along with abdominal pain and constipation.  You had blood work and a CAT scan.  Your symptoms were somewhat improved after draining your bladder and we are sending you home with some medication to help you move your bowels.  Will be important for you to stay well-hydrated and follow-up with your regular doctor.  Please return if any worsening symptoms.

## 2019-01-13 NOTE — ED Triage Notes (Addendum)
Per EMS, patient from home, c/o constipation x4 days. Reports decreased PO intake x4 days.   Reports CVA x3 months ago. A&Ox4, but family reports minor confusion at baseline since stroke.  Also c/o epigastric pain. Given aspirin and nitro PTA with relief. Denies chest pain and SOB.  20g R FA 324 ASA 0.4mg  SL NTG 4mg  Zofran 367ml NS

## 2019-01-13 NOTE — ED Notes (Signed)
Small amount of soap suds enema administered. Pt stated she could not tolerate more. Pt attempted to use bed pan with now success. Pt now up to bedside commode.

## 2019-01-14 LAB — ABO/RH: ABO/RH(D): O POS

## 2019-01-15 ENCOUNTER — Other Ambulatory Visit: Payer: Self-pay | Admitting: Family

## 2019-01-16 ENCOUNTER — Other Ambulatory Visit: Payer: Self-pay | Admitting: Family

## 2019-01-26 ENCOUNTER — Other Ambulatory Visit: Payer: Self-pay | Admitting: Family

## 2019-02-04 ENCOUNTER — Telehealth: Payer: Self-pay

## 2019-02-04 NOTE — Telephone Encounter (Signed)
Copied from [place] [id number]. Topic: Quick Communication - Lab Results (Clinic Use ONLY) >> Feb 04, 2019 10:18 AM Scherrie Gerlach wrote: Caren Griffins friend calling for results of labs done at the ED. (she is on the [facility].  Pt seems to be getting worse with sx of dementia and cynthia states she is concerned because of the pt's behavior. Pt has appt on Tuesday at 1:15 pm, but was hoping to get some insight if this is a UTI .  Pt had gone to the ED by herself (ambulance)  and they do not know if anyone had tried to contact her.

## 2019-02-04 NOTE — Telephone Encounter (Signed)
Last ER visit on 1/26 for constipation.  Do not see anything regarding dementia/altered mental status or UTI from that visit.

## 2019-02-05 ENCOUNTER — Other Ambulatory Visit: Payer: Self-pay | Admitting: Family

## 2019-02-05 ENCOUNTER — Ambulatory Visit: Payer: Medicare Other | Admitting: Family Medicine

## 2019-02-05 ENCOUNTER — Telehealth: Payer: Self-pay

## 2019-02-05 NOTE — Telephone Encounter (Signed)
Copied from [place] [id number]. Topic: General - Other >> Feb 05, 2019 11:42 AM Lennox Solders wrote: Reason for CRM: pt friend cynthia sprinkle who is on DPR would like dr Rodman Key nurse to call her. Pt had an appt today and pt refuse to come to doctor office. Pt is having memory issues and cynthia would like to discuss with the nurse

## 2019-02-05 NOTE — Telephone Encounter (Signed)
Spoke with friend, she is concerned about her health. Tried to get her to come to appointment today. She refused help and made an excuse. Do you have any suggestions?  Please advise

## 2019-02-06 NOTE — Telephone Encounter (Signed)
Spoke with Caren Griffins, verified DPR. She recommended her son, [name] bring her to appointment. He is not listed on DPR. She gave me his # [phone]. She will try to get a hold of him to make an appointment.

## 2019-02-06 NOTE — Telephone Encounter (Signed)
Tried calling number listed in chart with no answer.  Please try to contact and see if we can get her to reschedule.

## 2019-02-16 ENCOUNTER — Other Ambulatory Visit: Payer: Self-pay | Admitting: Family

## 2019-03-06 ENCOUNTER — Other Ambulatory Visit: Payer: Self-pay | Admitting: Family

## 2019-03-26 ENCOUNTER — Ambulatory Visit: Payer: Self-pay

## 2019-03-26 NOTE — Telephone Encounter (Signed)
Patient's friend on the DPR, Lester Odessa, called and says the patient's son text her to say his mother is not doing good. She says for months her memory is declining, she's confused. She says last week she called to check on her and the patient asked why was she calling when they are in the same house. Her son text and says that she is asking him to take her home and doesn't understand she's already at home. She says the patient is always in the bed, except for getting up to use the [facility]. She says the patient urinates in the commode, but she has diarrhea in the bed and will just lay in it. She says her son cleans her as much as he can without having to wash her private area, so she may have a UTI. She says the patient's son will make food and sit the plate in the patient's room to eat and when he comes back, the food is still sitting and he can't get her to eat but a few bites. She says the patient drinks about 2 beers a day, which is down from what she would normally drink. She says the son says she hasn't had a bath in months, since October when she was in the hospital, because she will not wash herself. She says the son puts a wash pan by her bed, but she will only wash her face. Caren Griffins asks what can she do to get the patient help, she says she has been cancelling her doctor appointments and will not talk to a doctor. She asks should she call 911 to go out and get her to the hospital to have tests run, in case something is going on? I asked will the patient get in a car for someone to drive her to the ED, she says no, because she doesn't want to leave out of her room and is confused about everything around her. I advised to call 911, she says she will call and text the son to let him know, because their phone is off and texting is the only communication with him right now.   Reason for Disposition . Nursing judgment  Protocols used: NO GUIDELINE OR REFERENCE AVAILABLE-A-AH

## 2019-03-27 ENCOUNTER — Telehealth: Payer: Self-pay

## 2019-03-27 ENCOUNTER — Other Ambulatory Visit: Payer: Self-pay | Admitting: Family

## 2019-03-28 NOTE — Telephone Encounter (Signed)
Pt was advised to go to ER. No answer . Closing encounter.

## 2019-04-01 ENCOUNTER — Telehealth: Payer: Self-pay | Admitting: Family

## 2019-04-01 NOTE — Telephone Encounter (Signed)
Received call from pt sister to cxl appt 4/13. They did nt wish to resch at this time

## 2019-04-03 ENCOUNTER — Other Ambulatory Visit: Payer: Medicare Other

## 2019-04-03 ENCOUNTER — Ambulatory Visit: Payer: Medicare Other | Admitting: Family

## 2019-04-08 ENCOUNTER — Telehealth: Payer: Self-pay | Admitting: Family Medicine

## 2019-04-08 NOTE — Telephone Encounter (Signed)
Called and could not leave a vm for patient. Calling to schedule an virtual visit with Wendy Edwards.

## 2019-05-01 ENCOUNTER — Other Ambulatory Visit: Payer: Self-pay | Admitting: *Deleted

## 2019-05-01 MED ORDER — ALPRAZOLAM 1 MG PO TABS: 1.0000 mg | ORAL_TABLET | Freq: Three times a day (TID) | ORAL | 2 refills | Status: DC | PRN

## 2019-07-21 ENCOUNTER — Other Ambulatory Visit: Payer: Self-pay | Admitting: Family

## 2019-07-26 ENCOUNTER — Telehealth: Payer: Self-pay | Admitting: *Deleted

## 2019-07-26 ENCOUNTER — Telehealth: Payer: Self-pay | Admitting: Hematology & Oncology

## 2019-07-26 ENCOUNTER — Encounter: Payer: Self-pay | Admitting: *Deleted

## 2019-07-26 NOTE — Telephone Encounter (Signed)
lmom for patient or care taker to call office and resch missed appts in April 202 per 8/7 sch msg

## 2019-07-26 NOTE — Progress Notes (Unsigned)
Message received from on call center stating that patient called for a refill of Xanax. Refill not sent d/t pt has not been seen since 07/2018.  Message sent to scheduling for patient to come in for lab and NP visit.

## 2019-07-26 NOTE — Telephone Encounter (Signed)
Message received from patient requesting a Xanax refill.  Call placed back to patient and patient notified that she has not been seen at this office since 07/2018 and will need to make an appt to obtain refill.  Patient states that she will call office back next week to schedule appointment.

## 2019-08-02 ENCOUNTER — Other Ambulatory Visit (HOSPITAL_BASED_OUTPATIENT_CLINIC_OR_DEPARTMENT_OTHER): Payer: Self-pay | Admitting: Family Medicine

## 2019-08-02 ENCOUNTER — Inpatient Hospital Stay (HOSPITAL_BASED_OUTPATIENT_CLINIC_OR_DEPARTMENT_OTHER): Payer: Medicare Other | Admitting: Hematology & Oncology

## 2019-08-02 ENCOUNTER — Other Ambulatory Visit: Payer: Self-pay

## 2019-08-02 ENCOUNTER — Inpatient Hospital Stay: Payer: Medicare Other | Attending: Hematology & Oncology

## 2019-08-02 ENCOUNTER — Ambulatory Visit (HOSPITAL_BASED_OUTPATIENT_CLINIC_OR_DEPARTMENT_OTHER)
Admission: RE | Admit: 2019-08-02 | Discharge: 2019-08-02 | Disposition: A | Discharge status: Home / Self Care | Payer: Medicare Other | Source: Ambulatory Visit | Attending: Hematology & Oncology | Admitting: Hematology & Oncology

## 2019-08-02 ENCOUNTER — Encounter: Payer: Self-pay | Admitting: Hematology & Oncology

## 2019-08-02 VITALS — BP 160/91 | HR 114 | Temp 98.0°F | Resp 18 | Wt 99.0 lb

## 2019-08-02 DIAGNOSIS — R05 Cough: Secondary | ICD-10-CM | POA: Diagnosis not present

## 2019-08-02 DIAGNOSIS — Z1231 Encounter for screening mammogram for malignant neoplasm of breast: Secondary | ICD-10-CM

## 2019-08-02 DIAGNOSIS — Z862 Personal history of diseases of the blood and blood-forming organs and certain disorders involving the immune mechanism: Secondary | ICD-10-CM

## 2019-08-02 DIAGNOSIS — D619 Aplastic anemia, unspecified: Secondary | ICD-10-CM | POA: Insufficient documentation

## 2019-08-02 LAB — CBC WITH DIFFERENTIAL (CANCER CENTER ONLY)
Abs Immature Granulocytes: 0.02 10*3/uL (ref 0.00–0.07)
Basophils Absolute: 0.1 10*3/uL (ref 0.0–0.1)
Basophils Relative: 1 %
Eosinophils Absolute: 0.1 10*3/uL (ref 0.0–0.5)
Eosinophils Relative: 1 %
HCT: 44.8 % (ref 36.0–46.0)
Hemoglobin: 14.9 g/dL (ref 12.0–15.0)
Immature Granulocytes: 0 %
Lymphocytes Relative: 36 %
Lymphs Abs: 2.6 10*3/uL (ref 0.7–4.0)
MCH: 31.7 pg (ref 26.0–34.0)
MCHC: 33.3 g/dL (ref 30.0–36.0)
MCV: 95.3 fL (ref 80.0–100.0)
Monocytes Absolute: 0.8 10*3/uL (ref 0.1–1.0)
Monocytes Relative: 10 %
Neutro Abs: 3.7 10*3/uL (ref 1.7–7.7)
Neutrophils Relative %: 52 %
Platelet Count: 151 10*3/uL (ref 150–400)
RBC: 4.7 MIL/uL (ref 3.87–5.11)
RDW: 13.8 % (ref 11.5–15.5)
WBC Count: 7.2 10*3/uL (ref 4.0–10.5)
nRBC: 0 % (ref 0.0–0.2)

## 2019-08-02 LAB — CMP (CANCER CENTER ONLY)
ALT: 8 U/L (ref 0–44)
AST: 13 U/L — ABNORMAL LOW (ref 15–41)
Albumin: 4.1 g/dL (ref 3.5–5.0)
Alkaline Phosphatase: 77 U/L (ref 38–126)
Anion gap: 10 (ref 5–15)
BUN: 8 mg/dL (ref 6–20)
CO2: 25 mmol/L (ref 22–32)
Calcium: 9.3 mg/dL (ref 8.9–10.3)
Chloride: 103 mmol/L (ref 98–111)
Creatinine: 0.47 mg/dL (ref 0.44–1.00)
GFR, Est AFR Am: >60 mL/min
GFR, Estimated: >60 mL/min
Glucose, Bld: 82 mg/dL (ref 70–99)
Potassium: 4.6 mmol/L (ref 3.5–5.1)
Sodium: 138 mmol/L (ref 135–145)
Total Bilirubin: 0.3 mg/dL (ref 0.3–1.2)
Total Protein: 6.6 g/dL (ref 6.5–8.1)

## 2019-08-02 MED ORDER — ALPRAZOLAM 1 MG PO TABS: 1.0000 mg | ORAL_TABLET | Freq: Three times a day (TID) | ORAL | 0 refills | Status: DC | PRN

## 2019-08-02 NOTE — Progress Notes (Signed)
Hematology and Oncology Follow Up Visit  Wendy Edwards [id number] [birth year] 53 y.o. 08/02/2019   Principle Diagnosis:  1. Aplastic anemia - clinical remission. 2. Chronic anxiety. 3. Hypertension.  Current Therapy:    Observation     Interim History:  Ms.  Edwards is for followup.  I am quite surprised as to see how Wendy Edwards looks.  She has lost an incredible amount of weight.  She seems to have some element of dementia now.  She was hospitalized back in October 2019.  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  The MRI was done on 09/27/2018.  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  She comes in with a daughter.  She is not been taking her depression medication.  She apparently ran out of this a week or so ago.  She basically has not been taking any of her medications because she ran out of them.  She will see her family doctor on Tuesday.  I just am surprised as to how different she looks.  A year ago she weighed 157 pounds.  Now she weighs 99 pounds.  She has swelling in the left hand.  I will get a Doppler of her left arm just to make sure that there is no thromboembolic disease.  She had a chest x-ray today.  She is still smoking.  Thankfully the chest x-ray did not show any lung nodules.  There is no evidence of congestive heart failure.  I am not sure how much she is really eating.  There is been no bleeding.  She has had no fever.  I think she stays home most of the time.  She is not we suggest the coronavirus.  I think if she did just see coronavirus, she would really be in bad shape.  Currently, I would say her performance status is ECOG 3.    Medications:  Current Outpatient Medications:  .  albuterol (ACCUNEB) 1.25 MG/3ML nebulizer solution, TAKE 3 ML(1 VIAL) BY NEBULIZATION 3 TIMES DAILY AS NEEDED FOR WHEEZING. (Patient taking differently:  Take 1 ampule by nebulization 3 (three) times daily as needed for wheezing or shortness of breath. ), Disp: 75 mL, Rfl: 1 .  ALPRAZolam (XANAX) 1 MG tablet, Take 1 tablet (1 mg total) by mouth 3 (three) times daily as needed., Disp: 90 tablet, Rfl: 0 .  amLODipine (NORVASC) 5 MG tablet, Take 1 tablet (5 mg total) by mouth daily., Disp: 90 tablet, Rfl: 3 .  PROAIR HFA 108 (90 Base) MCG/ACT inhaler, INHALE 2 PUFFS BY MOUTH EVERY 6 HOURS AS NEEDED (Patient taking differently: Inhale 2 puffs into the lungs every 6 (six) hours as needed for wheezing or shortness of breath. ), Disp: 8.5 Inhaler, Rfl: 0  Allergies:  Allergies  Allergen Reactions  . Bee Venom Hives    Past Medical History, Surgical history, Social history, and Family History were reviewed and updated.  Review of Systems: Review of Systems  Constitutional: Positive for malaise/fatigue and weight loss.  HENT: Negative.   Eyes: Negative.   Respiratory: Positive for wheezing.   Cardiovascular: Negative.   Gastrointestinal: Positive for heartburn and nausea.  Genitourinary: Negative.   Musculoskeletal: Positive for back pain.  Skin: Negative.   Neurological: Positive for weakness.  Endo/Heme/Allergies: Negative.   Psychiatric/Behavioral: The patient is nervous/anxious.      Physical Exam:  weight is 99 lb (  44.9 kg). Her temporal temperature is 98 F (36.7 C). Her blood pressure is 160/91 (abnormal) and her pulse is 114 (abnormal). Her respiration is 18 and oxygen saturation is 100%.   Physical Exam Vitals signs reviewed.  Constitutional:      Comments: Very thin white female.  She is bordering on cachexia.  She is a little bit disheveled.  HENT:     Head: Normocephalic and atraumatic.  Eyes:     Pupils: Pupils are equal, round, and reactive to light.  Neck:     Musculoskeletal: Normal range of motion.  Cardiovascular:     Rate and Rhythm: Normal rate and regular rhythm.     Heart sounds: Normal heart sounds.   Pulmonary:     Effort: Pulmonary effort is normal.     Breath sounds: Normal breath sounds.  Abdominal:     General: Bowel sounds are normal.     Palpations: Abdomen is soft.  Musculoskeletal: Normal range of motion.        General: No tenderness or deformity.  Lymphadenopathy:     Cervical: No cervical adenopathy.  Skin:    General: Skin is warm and dry.     Findings: No erythema or rash.  Neurological:     Mental Status: She is oriented to person, place, and time.  Psychiatric:        Behavior: Behavior normal.        Thought Content: Thought content normal.        Judgment: Judgment normal.     Lab Results  Component Value Date   WBC 7.2 08/02/2019   HGB 14.9 08/02/2019   HCT 44.8 08/02/2019   MCV 95.3 08/02/2019   PLT 151 08/02/2019     Chemistry      Component Value Date/Time   NA 138 08/02/2019 1008   NA 142 12/22/2017 1436   NA 137 03/24/2017 1113   K 4.6 08/02/2019 1008   K 3.9 12/22/2017 1436   K 4.1 03/24/2017 1113   CL 103 08/02/2019 1008   CL 105 12/22/2017 1436   CO2 25 08/02/2019 1008   CO2 27 12/22/2017 1436   CO2 23 03/24/2017 1113   BUN 8 08/02/2019 1008   BUN 6 (L) 12/22/2017 1436   BUN 11.2 03/24/2017 1113   CREATININE 0.47 08/02/2019 1008   CREATININE 0.6 12/22/2017 1436   CREATININE 0.7 03/24/2017 1113      Component Value Date/Time   CALCIUM 9.3 08/02/2019 1008   CALCIUM 9.7 12/22/2017 1436   CALCIUM 9.8 03/24/2017 1113   ALKPHOS 77 08/02/2019 1008   ALKPHOS 80 12/22/2017 1436   ALKPHOS 96 03/24/2017 1113   AST 13 (L) 08/02/2019 1008   AST 13 03/24/2017 1113   ALT 8 08/02/2019 1008   ALT 24 12/22/2017 1436   ALT 11 03/24/2017 1113   BILITOT 0.3 08/02/2019 1008   BILITOT 0.39 03/24/2017 1113         Impression and Plan: Wendy Edwards is 53 year old white female. She has a remote history of aplastic anemia. She was treated with immunosuppression therapy with anti-thrombocyte globulin. This probably was 18 years ago.  Again, I  am absolutely surprised as to see her overall status right now.  I think that her social "vices" are catching up to her.  I know that she is still drinking and smoking.  I know she does not watch her blood pressure take her medications.  I just feel bad that she has declined significantly.  I  really doubt that she is going to be able to recover to the point that she is all that independent.  Hopefully, she will be more diligent in taking her medications.  At least, the aplastic anemia is not an issue.  I cannot see any hematologic problem that we have to deal with right now.  I will like to see her back in 3 months for follow-up.  I had to spend about 45 minutes with her today.  Again I just was not aware as to her significant decline in status.  Marland KitchenVolanda Napoleon, MD 8/14/20204:50 PM

## 2019-08-05 ENCOUNTER — Telehealth: Payer: Self-pay | Admitting: *Deleted

## 2019-08-05 NOTE — Telephone Encounter (Signed)
Notified pt of results. No concerns at this time. 

## 2019-08-05 NOTE — Telephone Encounter (Signed)
-----   Message from [name], MD sent at 08/02/2019  3:08 PM EDT ----- Call - the CXR is ok.  No pneumonia or heart failure or cancer!!  Laurey Arrow

## 2019-09-02 ENCOUNTER — Other Ambulatory Visit: Payer: Self-pay | Admitting: Hematology & Oncology

## 2019-09-30 ENCOUNTER — Other Ambulatory Visit: Payer: Self-pay | Admitting: Hematology & Oncology

## 2019-10-28 ENCOUNTER — Other Ambulatory Visit: Payer: Self-pay | Admitting: Hematology & Oncology

## 2019-11-01 ENCOUNTER — Inpatient Hospital Stay: Payer: Medicare Other | Admitting: Hematology & Oncology

## 2019-11-01 ENCOUNTER — Ambulatory Visit (HOSPITAL_BASED_OUTPATIENT_CLINIC_OR_DEPARTMENT_OTHER): Payer: Medicare Other

## 2019-11-01 ENCOUNTER — Inpatient Hospital Stay: Payer: Medicare Other | Attending: Hematology & Oncology

## 2019-11-24 ENCOUNTER — Other Ambulatory Visit: Payer: Self-pay | Admitting: Hematology & Oncology

## 2019-11-25 ENCOUNTER — Other Ambulatory Visit: Payer: Self-pay | Admitting: *Deleted

## 2019-11-25 MED ORDER — ALPRAZOLAM 1 MG PO TABS: 1.0000 mg | ORAL_TABLET | Freq: Three times a day (TID) | ORAL | 0 refills | Status: DC | PRN

## 2019-12-23 ENCOUNTER — Other Ambulatory Visit: Payer: Self-pay | Admitting: Hematology & Oncology

## 2020-01-20 ENCOUNTER — Other Ambulatory Visit: Payer: Self-pay | Admitting: Hematology & Oncology

## 2020-02-16 ENCOUNTER — Other Ambulatory Visit: Payer: Self-pay | Admitting: Hematology & Oncology

## 2020-03-11 ENCOUNTER — Other Ambulatory Visit: Payer: Self-pay | Admitting: Hematology & Oncology

## 2020-04-07 ENCOUNTER — Other Ambulatory Visit: Payer: Self-pay | Admitting: Hematology & Oncology

## 2020-05-12 ENCOUNTER — Other Ambulatory Visit: Payer: Self-pay | Admitting: Hematology & Oncology

## 2020-06-09 ENCOUNTER — Other Ambulatory Visit: Payer: Self-pay | Admitting: Hematology & Oncology

## 2020-06-25 ENCOUNTER — Encounter (HOSPITAL_COMMUNITY): Payer: Self-pay | Admitting: Internal Medicine

## 2020-06-25 ENCOUNTER — Other Ambulatory Visit: Payer: Self-pay

## 2020-06-25 ENCOUNTER — Emergency Department (HOSPITAL_COMMUNITY): Payer: Medicare Other

## 2020-06-25 ENCOUNTER — Inpatient Hospital Stay (HOSPITAL_COMMUNITY): Payer: Medicare Other

## 2020-06-25 ENCOUNTER — Inpatient Hospital Stay (HOSPITAL_COMMUNITY)
Admission: EM | Admit: 2020-06-25 | Discharge: 2020-06-30 | DRG: 812 | Disposition: A | Discharge status: Home / Self Care | Payer: Medicare Other | Attending: Internal Medicine | Admitting: Internal Medicine

## 2020-06-25 DIAGNOSIS — S0511XA Contusion of eyeball and orbital tissues, right eye, initial encounter: Secondary | ICD-10-CM | POA: Diagnosis not present

## 2020-06-25 DIAGNOSIS — R269 Unspecified abnormalities of gait and mobility: Secondary | ICD-10-CM | POA: Diagnosis present

## 2020-06-25 DIAGNOSIS — Z96651 Presence of right artificial knee joint: Secondary | ICD-10-CM | POA: Diagnosis not present

## 2020-06-25 DIAGNOSIS — I10 Essential (primary) hypertension: Secondary | ICD-10-CM | POA: Diagnosis not present

## 2020-06-25 DIAGNOSIS — F419 Anxiety disorder, unspecified: Secondary | ICD-10-CM | POA: Diagnosis present

## 2020-06-25 DIAGNOSIS — Z6822 Body mass index (BMI) 22.0-22.9, adult: Secondary | ICD-10-CM | POA: Diagnosis not present

## 2020-06-25 DIAGNOSIS — K7460 Unspecified cirrhosis of liver: Secondary | ICD-10-CM

## 2020-06-25 DIAGNOSIS — F17200 Nicotine dependence, unspecified, uncomplicated: Secondary | ICD-10-CM | POA: Diagnosis present

## 2020-06-25 DIAGNOSIS — D61818 Other pancytopenia: Secondary | ICD-10-CM | POA: Diagnosis present

## 2020-06-25 DIAGNOSIS — S6992XA Unspecified injury of left wrist, hand and finger(s), initial encounter: Secondary | ICD-10-CM | POA: Diagnosis not present

## 2020-06-25 DIAGNOSIS — D510 Vitamin B12 deficiency anemia due to intrinsic factor deficiency: Principal | ICD-10-CM

## 2020-06-25 DIAGNOSIS — Z743 Need for continuous supervision: Secondary | ICD-10-CM | POA: Diagnosis not present

## 2020-06-25 DIAGNOSIS — Z8249 Family history of ischemic heart disease and other diseases of the circulatory system: Secondary | ICD-10-CM

## 2020-06-25 DIAGNOSIS — Z91030 Bee allergy status: Secondary | ICD-10-CM | POA: Diagnosis not present

## 2020-06-25 DIAGNOSIS — Z888 Allergy status to other drugs, medicaments and biological substances status: Secondary | ICD-10-CM

## 2020-06-25 DIAGNOSIS — D619 Aplastic anemia, unspecified: Secondary | ICD-10-CM

## 2020-06-25 DIAGNOSIS — Z86718 Personal history of other venous thrombosis and embolism: Secondary | ICD-10-CM | POA: Diagnosis not present

## 2020-06-25 DIAGNOSIS — E538 Deficiency of other specified B group vitamins: Secondary | ICD-10-CM | POA: Diagnosis present

## 2020-06-25 DIAGNOSIS — S199XXA Unspecified injury of neck, initial encounter: Secondary | ICD-10-CM | POA: Diagnosis not present

## 2020-06-25 DIAGNOSIS — K219 Gastro-esophageal reflux disease without esophagitis: Secondary | ICD-10-CM | POA: Diagnosis present

## 2020-06-25 DIAGNOSIS — Z87442 Personal history of urinary calculi: Secondary | ICD-10-CM | POA: Diagnosis not present

## 2020-06-25 DIAGNOSIS — I499 Cardiac arrhythmia, unspecified: Secondary | ICD-10-CM | POA: Diagnosis not present

## 2020-06-25 DIAGNOSIS — E46 Unspecified protein-calorie malnutrition: Secondary | ICD-10-CM | POA: Diagnosis present

## 2020-06-25 DIAGNOSIS — W19XXXA Unspecified fall, initial encounter: Secondary | ICD-10-CM

## 2020-06-25 DIAGNOSIS — S0091XA Abrasion of unspecified part of head, initial encounter: Secondary | ICD-10-CM | POA: Diagnosis not present

## 2020-06-25 DIAGNOSIS — K7030 Alcoholic cirrhosis of liver without ascites: Secondary | ICD-10-CM | POA: Diagnosis present

## 2020-06-25 DIAGNOSIS — M79642 Pain in left hand: Secondary | ICD-10-CM | POA: Diagnosis not present

## 2020-06-25 DIAGNOSIS — J449 Chronic obstructive pulmonary disease, unspecified: Secondary | ICD-10-CM | POA: Diagnosis present

## 2020-06-25 DIAGNOSIS — H547 Unspecified visual loss: Secondary | ICD-10-CM | POA: Diagnosis present

## 2020-06-25 DIAGNOSIS — R0902 Hypoxemia: Secondary | ICD-10-CM | POA: Diagnosis not present

## 2020-06-25 DIAGNOSIS — R6889 Other general symptoms and signs: Secondary | ICD-10-CM | POA: Diagnosis not present

## 2020-06-25 DIAGNOSIS — S0990XA Unspecified injury of head, initial encounter: Secondary | ICD-10-CM | POA: Diagnosis not present

## 2020-06-25 DIAGNOSIS — Z20822 Contact with and (suspected) exposure to covid-19: Secondary | ICD-10-CM | POA: Diagnosis not present

## 2020-06-25 DIAGNOSIS — Z79899 Other long term (current) drug therapy: Secondary | ICD-10-CM | POA: Diagnosis not present

## 2020-06-25 DIAGNOSIS — F1010 Alcohol abuse, uncomplicated: Secondary | ICD-10-CM | POA: Diagnosis present

## 2020-06-25 DIAGNOSIS — M25539 Pain in unspecified wrist: Secondary | ICD-10-CM | POA: Diagnosis not present

## 2020-06-25 DIAGNOSIS — K7689 Other specified diseases of liver: Secondary | ICD-10-CM | POA: Diagnosis not present

## 2020-06-25 DIAGNOSIS — D696 Thrombocytopenia, unspecified: Secondary | ICD-10-CM | POA: Diagnosis present

## 2020-06-25 LAB — RETICULOCYTES
Immature Retic Fract: 12.8 % (ref 2.3–15.9)
RBC.: 2.21 MIL/uL — ABNORMAL LOW (ref 3.87–5.11)
Retic Count, Absolute: 61.9 10*3/uL (ref 19.0–186.0)
Retic Ct Pct: 2.8 % (ref 0.4–3.1)

## 2020-06-25 LAB — CBC WITH DIFFERENTIAL/PLATELET
Abs Immature Granulocytes: 0.02 10*3/uL (ref 0.00–0.07)
Basophils Absolute: 0 10*3/uL (ref 0.0–0.1)
Basophils Relative: 0 %
Eosinophils Absolute: 0 10*3/uL (ref 0.0–0.5)
Eosinophils Relative: 0 %
HCT: 25.7 % — ABNORMAL LOW (ref 36.0–46.0)
Hemoglobin: 8.2 g/dL — ABNORMAL LOW (ref 12.0–15.0)
Immature Granulocytes: 1 %
Lymphocytes Relative: 53 %
Lymphs Abs: 1.2 10*3/uL (ref 0.7–4.0)
MCH: 36.6 pg — ABNORMAL HIGH (ref 26.0–34.0)
MCHC: 31.9 g/dL (ref 30.0–36.0)
MCV: 114.7 fL — ABNORMAL HIGH (ref 80.0–100.0)
Monocytes Absolute: 0.5 10*3/uL (ref 0.1–1.0)
Monocytes Relative: 22 %
Neutro Abs: 0.5 10*3/uL — ABNORMAL LOW (ref 1.7–7.7)
Neutrophils Relative %: 24 %
Platelets: 10 10*3/uL — CL (ref 150–400)
RBC: 2.24 MIL/uL — ABNORMAL LOW (ref 3.87–5.11)
RDW: 14.8 % (ref 11.5–15.5)
WBC: 2.3 10*3/uL — ABNORMAL LOW (ref 4.0–10.5)
nRBC: 0 % (ref 0.0–0.2)

## 2020-06-25 LAB — FIBRINOGEN: Fibrinogen: 383 mg/dL (ref 210–475)

## 2020-06-25 LAB — HEPATIC FUNCTION PANEL
ALT: 13 U/L (ref 0–44)
AST: 15 U/L (ref 15–41)
Albumin: 4 g/dL (ref 3.5–5.0)
Alkaline Phosphatase: 113 U/L (ref 38–126)
Bilirubin, Direct: 0.1 mg/dL (ref 0.0–0.2)
Indirect Bilirubin: 0.4 mg/dL (ref 0.3–0.9)
Total Bilirubin: 0.5 mg/dL (ref 0.3–1.2)
Total Protein: 7 g/dL (ref 6.5–8.1)

## 2020-06-25 LAB — BASIC METABOLIC PANEL
Anion gap: 15 (ref 5–15)
BUN: 23 mg/dL — ABNORMAL HIGH (ref 6–20)
CO2: 19 mmol/L — ABNORMAL LOW (ref 22–32)
Calcium: 8.9 mg/dL (ref 8.9–10.3)
Chloride: 104 mmol/L (ref 98–111)
Creatinine, Ser: 0.58 mg/dL (ref 0.44–1.00)
GFR calc Af Amer: >60 mL/min (ref >60)
GFR calc non Af Amer: >60 mL/min (ref >60)
Glucose, Bld: 91 mg/dL (ref 70–99)
Potassium: 5 mmol/L (ref 3.5–5.1)
Sodium: 138 mmol/L (ref 135–145)

## 2020-06-25 LAB — IRON AND TIBC
Iron: 136 ug/dL (ref 28–170)
Saturation Ratios: 32 % — ABNORMAL HIGH (ref 10.4–31.8)
TIBC: 423 ug/dL (ref 250–450)
UIBC: 287 ug/dL

## 2020-06-25 LAB — PROTIME-INR
INR: 1.2 (ref 0.8–1.2)
Prothrombin Time: 14.4 seconds (ref 11.4–15.2)

## 2020-06-25 LAB — VITAMIN B12: Vitamin B-12: 83 pg/mL — ABNORMAL LOW (ref 180–914)

## 2020-06-25 LAB — LACTATE DEHYDROGENASE: LDH: 156 U/L (ref 98–192)

## 2020-06-25 LAB — OCCULT BLOOD X 1 CARD TO LAB, STOOL: Fecal Occult Bld: NEGATIVE

## 2020-06-25 LAB — HIV ANTIBODY (ROUTINE TESTING W REFLEX): HIV Screen 4th Generation wRfx: NONREACTIVE

## 2020-06-25 LAB — FERRITIN: Ferritin: 46 ng/mL (ref 11–307)

## 2020-06-25 LAB — APTT: aPTT: 27 seconds (ref 24–36)

## 2020-06-25 LAB — SARS CORONAVIRUS 2 BY RT PCR (HOSPITAL ORDER, PERFORMED IN ~~LOC~~ HOSPITAL LAB): SARS Coronavirus 2: NEGATIVE

## 2020-06-25 MED ORDER — SODIUM CHLORIDE 0.9% IV SOLUTION: Freq: Once | INTRAVENOUS | Status: DC

## 2020-06-25 MED ORDER — FOLIC ACID 1 MG PO TABS
1.0000 mg | ORAL_TABLET | Freq: Every day | ORAL | Status: DC
  Administered 2020-06-25 – 2020-06-30 (×6): 1 mg via ORAL
  Filled 2020-06-25 (×6): qty 1

## 2020-06-25 MED ORDER — ADULT MULTIVITAMIN W/MINERALS CH
1.0000 | ORAL_TABLET | Freq: Every day | ORAL | Status: DC
  Administered 2020-06-25 – 2020-06-30 (×6): 1 via ORAL
  Filled 2020-06-25 (×6): qty 1

## 2020-06-25 MED ORDER — ALPRAZOLAM 1 MG PO TABS
1.0000 mg | ORAL_TABLET | Freq: Three times a day (TID) | ORAL | Status: DC | PRN
  Administered 2020-06-26 – 2020-06-30 (×6): 1 mg via ORAL
  Filled 2020-06-25 (×3): qty 2
  Filled 2020-06-25: qty 1
  Filled 2020-06-25: qty 2
  Filled 2020-06-25: qty 1

## 2020-06-25 MED ORDER — LORAZEPAM 1 MG PO TABS: 1.0000 mg | ORAL_TABLET | ORAL | Status: AC | PRN

## 2020-06-25 MED ORDER — LORAZEPAM 2 MG/ML IJ SOLN: 1.0000 mg | INTRAMUSCULAR | Status: AC | PRN

## 2020-06-25 MED ORDER — THIAMINE HCL 100 MG PO TABS
100.0000 mg | ORAL_TABLET | Freq: Every day | ORAL | Status: DC
  Administered 2020-06-25 – 2020-06-30 (×6): 100 mg via ORAL
  Filled 2020-06-25 (×6): qty 1

## 2020-06-25 MED ORDER — THIAMINE HCL 100 MG/ML IJ SOLN
100.0000 mg | Freq: Every day | INTRAMUSCULAR | Status: DC
  Filled 2020-06-25: qty 2

## 2020-06-25 NOTE — Consult Note (Signed)
Referral MD  Reason for Referral: Pancytopenia  Chief Complaint  Patient presents with  . Fall  : I fell on my face.  HPI: Wendy Edwards is very well-known to me.  She is a very nice 54 year old white female.  She has a past history of aplastic anemia.  She was diagnosed back in 2001.  Ultimately, she was seen at [facility] and treated with intensive amino suppressive therapy with ATG.  She got to remission.  She has been in remission since.  We last saw her in the office back in August 2020.  She had lost weight.  She was at home.  She was still smoking.  She apparently fell while using her walker on Saturday.  She sustained facial trauma.  She has a lot of ecchymoses around both eyes.  She was subsequently brought to the emergency room at [facility] today.  She had CAT scans of her face and neck.  Thankfully there were no breaks.  However, she had a CBC done.  This was quite different than when we last saw her.  Her white cell count was 2.3.  Hemoglobin 8.2.  Platelet count 10,000.  Her corrected reticulocyte count was about 1%.  She had a normal PT and PTT.  She had a relatively normal metabolic panel.  I looked at her blood smear under the microscope.  I did not see any obvious leukemic cells.  She had lymphocytes which appeared mature.  Platelets were very few and far between.  Platelets were small.  She had no nucleated red blood cells.  There is no teardrop red blood cells.  She had a mild anisocytosis and poikilocytosis.  We were asked to see her for recommendations for management.  She has not traveled anywhere.  She is still smoking.  She is still drinking, probably much more than what she says.  She is drinking beer.  She does not do recreational drugs.  She has not been around anybody with Covid.  She has not had her Covid vaccine.  I am amazed at the weight loss that she has.  She probably has lost 40 or 50 pounds in 2 years.  I think we saw her back in August 2020 her  weight was 100 pounds.  Overall, her performance status is ECOG 3.    Past Medical History:  Diagnosis Date  . 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 Aplastic anemia ([place])    hx/notes 05/09/2007  . Arthritis    "right knee" (02/21/2017)  . Asthma   . Chronic anxiety    Archie Endo 02/04/2016  . COPD (chronic obstructive pulmonary disease) ([place])    hx/notes 05/09/2007  . Daily headache   . Depression   . DVT (deep venous thrombosis) ([facility])    hx upper and lower extremeties/notes 05/09/2007  . GERD (gastroesophageal reflux disease)   . Heart murmur 1974  . History of blood transfusion    "related to my aplastic anemia"  . History of kidney stones   . Hypertension   . Migraine    "a few/year" (02/21/2017)  . Pneumonia 1974   hospitalized "w/double pneumonia"  . Serum sickness    hx/notes 05/09/2007  . Transaminitis    asymptomatic hx/notes 05/09/2007  :  Past Surgical History:  Procedure Laterality Date  . CYSTOSCOPY W/ URETEROSCOPY W/ LITHOTRIPSY    . EXTREMITY CYST EXCISION Left 2017   upper, inner arm  . KNEE ARTHROSCOPY  Right 1985  . MEDIAL PARTIAL KNEE REPLACEMENT Right 2005  . TOTAL ABDOMINAL HYSTERECTOMY  2003   hx/notes 05/09/2007  . TOTAL KNEE REVISION Right 02/20/2017   Procedure: TOTAL KNEE REVISION;  Surgeon: Frederik Pear, MD;  Location: [place];  Service: Orthopedics;  Laterality: Right;  . TUBAL LIGATION    . TUNNELED VENOUS CATHETER PLACEMENT Right    hx/notes 05/09/2007; "took it out ~ 1 wk later"  :   Current Facility-Administered Medications:  .  0.9 %  sodium chloride infusion (Manually program via Guardrails IV Fluids), , Intravenous, Once, Alvy Bimler, Ni, MD .  ALPRAZolam Duanne Moron) tablet 1 mg, 1 mg, Oral, TID PRN, Lequita Halt, MD .  folic acid (FOLVITE) tablet 1 mg, 1 mg, Oral, Daily, Zhang, Ping T, MD .  LORazepam (ATIVAN) tablet 1-4 mg, 1-4 mg, Oral, Q1H PRN **OR** LORazepam (ATIVAN)  injection 1-4 mg, 1-4 mg, Intravenous, Q1H PRN, Wynetta Fines T, MD .  multivitamin with minerals tablet 1 tablet, 1 tablet, Oral, Daily, Zhang, Pearletha Forge T, MD .  thiamine tablet 100 mg, 100 mg, Oral, Daily **OR** thiamine (B-1) injection 100 mg, 100 mg, Intravenous, Daily, Wynetta Fines T, MD  Current Outpatient Medications:  .  acetaminophen (TYLENOL) 500 MG tablet, Take 1,000 mg by mouth every 6 (six) hours as needed for mild pain., Disp: , Rfl:  .  ALPRAZolam (XANAX) 1 MG tablet, TAKE 1 TABLET BY MOUTH THREE TIMES A DAY AS NEEDED (Patient taking differently: Take 1 mg by mouth 3 (three) times daily as needed. ), Disp: 90 tablet, Rfl: 0 .  albuterol (ACCUNEB) 1.25 MG/3ML nebulizer solution, TAKE 3 ML(1 VIAL) BY NEBULIZATION 3 TIMES DAILY AS NEEDED FOR WHEEZING. (Patient not taking: Reported on 06/25/2020), Disp: 75 mL, Rfl: 1 .  PROAIR HFA 108 (90 Base) MCG/ACT inhaler, INHALE 2 PUFFS BY MOUTH EVERY 6 HOURS AS NEEDED (Patient not taking: INHALE 2 PUFFS BY MOUTH EVERY 6 HOURS AS NEEDED), Disp: 8.5 Inhaler, Rfl: 0:  . sodium chloride   Intravenous Once  . folic acid  1 mg Oral Daily  . multivitamin with minerals  1 tablet Oral Daily  . thiamine  100 mg Oral Daily   Or  . thiamine  100 mg Intravenous Daily  :  Allergies  Allergen Reactions  . Bee Venom Hives  . Phenergan [Promethazine] Other (See Comments)    "made me shaky"  :  Family History  Problem Relation Age of Onset  . Coronary artery disease Father 71       CABG  . Cancer Father   . Hypertension Mother   . Cancer Mother   :  Social History   Socioeconomic History  . Marital status: Legally Separated    Spouse name: Not on file  . Number of children: 3  . Years of education: Not on file  . Highest education level: Not on file  Occupational History  . Occupation: disabled    Fish farm manager: UNEMPLOYED  Tobacco Use  . Smoking status: Current Every Day Smoker    Packs/day: 0.50    Years: 34.00    Pack years: 17.00    Types:  Cigarettes  . Smokeless tobacco: Never Used  Vaping Use  . Vaping Use: Some days  Substance and Sexual Activity  . Alcohol use: Yes    Alcohol/week: 12.0 standard drinks    Types: 12 Cans of beer per week    Comment: previously a heavy drinker - 6-7 beers daily; last drank maybe a week ago  .  Drug use: No  . Sexual activity: Not Currently  Other Topics Concern  . Not on file  Social History Narrative   Lives with brother.   Social Determinants of Health   Financial Resource Strain:   . Difficulty of Paying Living Expenses:   Food Insecurity:   . Worried About Charity fundraiser in the Last Year:   . Arboriculturist in the Last Year:   Transportation Needs:   . Film/video editor (Medical):   Marland Kitchen Lack of Transportation (Non-Medical):   Physical Activity:   . Days of Exercise per Week:   . Minutes of Exercise per Session:   Stress:   . Feeling of Stress :   Social Connections:   . Frequency of Communication with Friends and Family:   . Frequency of Social Gatherings with Friends and Family:   . Attends Religious Services:   . Active Member of Clubs or Organizations:   . Attends Archivist Meetings:   Marland Kitchen Marital Status:   Intimate Partner Violence:   . Fear of Current or Ex-Partner:   . Emotionally Abused:   Marland Kitchen Physically Abused:   . Sexually Abused:   :  Review of Systems  Constitutional: Positive for malaise/fatigue and weight loss.  HENT: Negative.   Eyes: Positive for blurred vision.  Respiratory: Positive for shortness of breath.   Cardiovascular: Negative.   Gastrointestinal: Positive for nausea.  Genitourinary: Negative.   Musculoskeletal: Positive for myalgias.  Neurological: Positive for weakness.  Endo/Heme/Allergies: Bruises/bleeds easily.  Psychiatric/Behavioral: Negative.      Exam:  This is a chronically ill-appearing white female.  She is in no obvious distress.  Vital signs show temperature of 98.  Pulse 105.  Blood pressure  117/79.  Her head neck exam shows obvious facial trauma.  She has a large eschar on the left forehead.  She has large ecchymosis around both eyes.  She has some ecchymoses on her cheeks.  There is no intraoral trauma.  There is no fractured teeth.  There is no adenopathy in the neck   Lungs are clear.  Cardiac exam tachycardic but regular.  There are no murmurs, rubs or bruits.  Abdomen is soft.  There is no fluid wave.  There is decreased bowel sounds.  There is no palpable liver or spleen tip.  Extremities shows muscle atrophy in upper and lower extremities.  Skin exam shows petechia on her arms and legs.  Neurological exam shows no obvious focal neurological deficits.   Patient Vitals for the past 24 hrs:  BP Temp Temp src Pulse Resp SpO2  06/25/20 1744 117/79 -- -- (!) 105 (!) 22 99 %  06/25/20 1707 139/75 -- -- -- 19 100 %  06/25/20 1430 (!) 141/89 -- -- (!) 112 20 96 %  06/25/20 1234 138/79 -- -- (!) 111 18 97 %  06/25/20 1031 (!) 163/92 98.6 F (37 C) Oral (!) 112 13 100 %  06/25/20 1024 -- -- -- -- -- 98 %     Recent Labs    06/25/20 1224  WBC 2.3*  HGB 8.2*  HCT 25.7*  PLT 10*   Recent Labs    06/25/20 1157  NA 138  K 5.0  CL 104  CO2 19*  GLUCOSE 91  BUN 23*  CREATININE 0.58  CALCIUM 8.9    Blood smear review: See above  Pathology: None    Assessment and Plan: Ms. Gunnoe is a very nice 54 year old white female.  She has a remote history of aplastic anemia.  She was treated with aggressive immunosuppressive therapy.  She got into remission.  Now, I have to really worry about a relapse of the aplastic anemia.  I did not see any obvious leukemic cells on the blood smear.  However, acute leukemia is a possibility.  She could also have myelodysplasia from past therapy.  The only way we will know what is going on is with a bone marrow biopsy.  She clearly needs to have 1.  I hope 1 can be done tomorrow but I am not sure.  I think she got some platelets.  We will  need to be judicious with giving her blood products.  If she does have aplastic anemia, we will have to transfer her to a [facility] where they have their facilities to treat her.  I hate to say that she is just in poor shape overall.  She really has declined since we saw her a year ago.  I am not sure why she has this big decline.  I suppose she could have excessive alcohol use that could be causing marrow toxicity.  I would get a vitamin B12 level on her.  Again I think this would be an usual since I do not see hypersegmented polys on the blood smear.  I just have a bad feeling that we are going to find something that would be very difficult to treat.  Hopefully, she will be able to heal from these wounds from falling.  We will follow her along.  Again the key will be the bone marrow biopsy.  I appreciate everybody's help in taking care of Wendy Edwards.  I have known her for 20 years.   Lattie Haw, MD  Darlyn Chamber 17:14

## 2020-06-25 NOTE — ED Notes (Signed)
Electronic consent to receive blood products obtained and witnessed by this RN.

## 2020-06-25 NOTE — Progress Notes (Signed)
I received a consult from emergency room about this patient I saw her and ordered a unit of platelets due to her extensive bruises I notify her primary oncologist, Dr. Marin Olp about her situation Review of peripheral smear show severe pancytopenia, suspicious for recurrent aplastic anemia Dr. Marin Olp will see her tonight

## 2020-06-25 NOTE — H&P (Addendum)
History and Physical    Wendy Edwards ZDG:[id number] DOB: 31-Oct-1966 DOA: 06/25/2020  PCP: Luetta Nutting, DO (Confirm with patient/family/NH records and if not entered, this has to be entered at [facility] point of entry) Patient coming from: [place]  I have personally briefly reviewed patient's old medical records in [place]  Chief Complaint: I fell  HPI: Wendy Edwards is a 54 y.o. female with medical history significant of remote Hx of aplastic anemia, chronic ambulation dysfunction using walker, anxiety/depression, binge alcohol drinking, presented with mechanical fall and severe abuse.  Patient reported 4 days ago, she tripped over Stairs at home, fell forward and hit her face.  She sustained some previous on her forehead and both eyes.  She has some headache initially denied any blurred vision lightheadedness.  Over the last 3 days she has noticed the post has not improved, she also reported she developed a rash over her bilateral shin area for more than 2 to 3 weeks.  She admits to drinking beer almost every other day, but never more than 12 ounces.  And she was never in alcohol withdrawal before.  She further denied any easy bruising, no hematuria, no bloody stool or dark-colored stool no abdominal pain.  She further denied any chest pain no short of breath. ED Course: Blood work shows pancytopenia, platelet 10, hemoglobin 8.2 from 14.9 last year, WBC 2.3  Review of Systems: As per HPI otherwise 10 point review of systems negative.    Past Medical History:  Diagnosis Date  . 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 Aplastic anemia ([place])    hx/notes 05/09/2007  . Arthritis    "right knee" (02/21/2017)  . Asthma   . Chronic anxiety    Archie Endo 02/04/2016  . COPD (chronic obstructive pulmonary disease) ([place])    hx/notes 05/09/2007  . Daily headache   . Depression   . DVT (deep venous thrombosis) (H[facility])    hx  upper and lower extremeties/notes 05/09/2007  . GERD (gastroesophageal reflux disease)   . Heart murmur 1974  . History of blood transfusion    "related to my aplastic anemia"  . History of kidney stones   . Hypertension   . Migraine    "a few/year" (02/21/2017)  . Pneumonia 1974   hospitalized "w/double pneumonia"  . Serum sickness    hx/notes 05/09/2007  . Transaminitis    asymptomatic hx/notes 05/09/2007    Past Surgical History:  Procedure Laterality Date  . CYSTOSCOPY W/ URETEROSCOPY W/ LITHOTRIPSY    . EXTREMITY CYST EXCISION Left 2017   upper, inner arm  . KNEE ARTHROSCOPY Right 1985  . MEDIAL PARTIAL KNEE REPLACEMENT Right 2005  . TOTAL ABDOMINAL HYSTERECTOMY  2003   hx/notes 05/09/2007  . TOTAL KNEE REVISION Right 02/20/2017   Procedure: TOTAL KNEE REVISION;  Surgeon: Frederik Pear, MD;  Location: [facility];  Service: Orthopedics;  Laterality: Right;  . TUBAL LIGATION    . TUNNELED VENOUS CATHETER PLACEMENT Right    hx/notes 05/09/2007; "took it out ~ 1 wk later"     reports that she has been smoking cigarettes. She has a 17.00 pack-year smoking history. She has never used smokeless tobacco. She reports current alcohol use of about 12.0 standard drinks of alcohol per week. She reports that she does not use drugs.  Allergies  Allergen Reactions  . Bee Venom Hives  . Phenergan [Promethazine] Other (See Comments)    "made me  shaky"    Family History  Problem Relation Age of Onset  . Coronary artery disease Father 27       CABG  . Cancer Father   . Hypertension Mother   . Cancer Mother      Prior to Admission medications   Medication Sig Start Date End Date Taking? Authorizing Provider  acetaminophen (TYLENOL) 500 MG tablet Take 1,000 mg by mouth every 6 (six) hours as needed for mild pain.   Yes [provider]  ALPRAZolam (XANAX) 1 MG tablet TAKE 1 TABLET BY MOUTH THREE TIMES A DAY AS NEEDED Patient taking differently: Take 1 mg by mouth 3 (three) times daily  as needed.  06/09/20  Yes [place], Rudell Cobb, MD  albuterol (ACCUNEB) 1.25 MG/3ML nebulizer solution TAKE 3 ML(1 VIAL) BY NEBULIZATION 3 TIMES DAILY AS NEEDED FOR WHEEZING. Patient not taking: Reported on 06/25/2020 10/01/18   [place], Holli Humbles, NP  PROAIR HFA 108 (90 Base) MCG/ACT inhaler INHALE 2 PUFFS BY MOUTH EVERY 6 HOURS AS NEEDED Patient not taking: INHALE 2 PUFFS BY MOUTH EVERY 6 HOURS AS NEEDED 12/24/18   [place], Holli Humbles, NP    Physical Exam: Vitals:   06/25/20 1024 06/25/20 1031 06/25/20 1234 06/25/20 1430  BP:  (!) 163/92 138/79 (!) 141/89  Pulse:  (!) 112 (!) 111 (!) 112  Resp:  13 18 20   Temp:  98.6 F (37 C)    TempSrc:  Oral    SpO2: 98% 100% 97% 96%    Constitutional: NAD, calm, comfortable Vitals:   06/25/20 1024 06/25/20 1031 06/25/20 1234 06/25/20 1430  BP:  (!) 163/92 138/79 (!) 141/89  Pulse:  (!) 112 (!) 111 (!) 112  Resp:  13 18 20   Temp:  98.6 F (37 C)    TempSrc:  Oral    SpO2: 98% 100% 97% 96%   Eyes: PERRL, lids and conjunctivae normal, heavy bruises on B/L eye brows and left forehead  ENMT: Mucous membranes are moist. Posterior pharynx clear of any exudate or lesions.Normal dentition.  Neck: normal, supple, no masses, no thyromegaly Respiratory: clear to auscultation bilaterally, no wheezing, no crackles. Normal respiratory effort. No accessory muscle use.  Cardiovascular: Regular rate and rhythm, no murmurs / rubs / gallops. No extremity edema. 2+ pedal pulses. No carotid bruits.  Abdomen: no tenderness, no masses palpated. No hepatosplenomegaly. Bowel sounds positive.  Musculoskeletal: no clubbing / cyanosis. No joint deformity upper and lower extremities. Good ROM, no contractures. Normal muscle tone.  Skin: no rashes, lesions, ulcers. No induration, petechia on bilateral shin area Neurologic: CN 2-12 grossly intact. Sensation intact, DTR normal. Strength 5/5 in all 4.  Psychiatric: Normal judgment and insight. Alert and oriented x 3. Normal mood.      Labs on Admission: I have personally reviewed following labs and imaging studies  CBC: Recent Labs  Lab 06/25/20 1224  WBC 2.3*  NEUTROABS 0.5*  HGB 8.2*  HCT 25.7*  MCV 114.7*  PLT 10*   Basic Metabolic Panel: Recent Labs  Lab 06/25/20 1157  NA 138  K 5.0  CL 104  CO2 19*  GLUCOSE 91  BUN 23*  CREATININE 0.58  CALCIUM 8.9   GFR: CrCl cannot be calculated (Unknown ideal weight.). Liver Function Tests: No results for input(s): AST, ALT, ALKPHOS, BILITOT, PROT, ALBUMIN in the last 168 hours. No results for input(s): LIPASE, AMYLASE in the last 168 hours. No results for input(s): AMMONIA in the last 168 hours. Coagulation Profile: No results for  input(s): INR, PROTIME in the last 168 hours. Cardiac Enzymes: No results for input(s): CKTOTAL, CKMB, CKMBINDEX, TROPONINI in the last 168 hours. BNP (last 3 results) No results for input(s): PROBNP in the last 8760 hours. HbA1C: No results for input(s): HGBA1C in the last 72 hours. CBG: No results for input(s): GLUCAP in the last 168 hours. Lipid Profile: No results for input(s): CHOL, HDL, LDLCALC, TRIG, CHOLHDL, LDLDIRECT in the last 72 hours. Thyroid Function Tests: No results for input(s): TSH, T4TOTAL, FREET4, T3FREE, THYROIDAB in the last 72 hours. Anemia Panel: No results for input(s): VITAMINB12, FOLATE, FERRITIN, TIBC, IRON, RETICCTPCT in the last 72 hours. Urine analysis:    Component Value Date/Time   COLORURINE YELLOW 01/13/2019 1822   APPEARANCEUR CLEAR 01/13/2019 1822   LABSPEC 1.020 01/13/2019 1822   PHURINE 6.0 01/13/2019 1822   GLUCOSEU NEGATIVE 01/13/2019 1822   HGBUR NEGATIVE 01/13/2019 1822   BILIRUBINUR NEGATIVE 01/13/2019 1822   KETONESUR 80 (A) 01/13/2019 1822   PROTEINUR 30 (A) 01/13/2019 1822   UROBILINOGEN 0.2 11/11/2010 0327   NITRITE POSITIVE (A) 01/13/2019 1822   LEUKOCYTESUR NEGATIVE 01/13/2019 1822    Radiological Exams on Admission: CT Head Wo Contrast  Result Date:  06/25/2020 CLINICAL DATA:  Fall on Saturday. Altered vision in both eyes. Bilateral periorbital hematomas and facial abrasions. EXAM: CT HEAD WITHOUT CONTRAST CT MAXILLOFACIAL WITHOUT CONTRAST CT CERVICAL SPINE WITHOUT CONTRAST TECHNIQUE: Multidetector CT imaging of the head, cervical spine, and maxillofacial structures were performed using the standard protocol without intravenous contrast. Multiplanar CT image reconstructions of the cervical spine and maxillofacial structures were also generated. COMPARISON:  MRI brain from 09/27/2018 FINDINGS: CT HEAD FINDINGS Brain: Small peripheral linear hypodensities in the left cerebellum, not changed from prior MRI, suggesting small remote left cerebellar infarcts. The brainstem, thalami, and basal ganglia appear unremarkable. The ventricular system and basilar cisterns appear normal. No intracranial hemorrhage, mass lesion, or acute CVA. Vascular: There is atherosclerotic calcification of the cavernous carotid arteries bilaterally. Skull: Unremarkable Other: No supplemental non-categorized findings. CT MAXILLOFACIAL FINDINGS Osseous: No facial fracture is identified. Orbits: No intraorbital abnormality is observed. Sinuses: Unremarkable Soft tissues: Large bilateral periorbital hematomas. Left frontal scalp hematoma along the forehead. CT CERVICAL SPINE FINDINGS Alignment: No vertebral subluxation is observed. Skull base and vertebrae: Small bilateral C7 cervical ribs. No cervical spine fracture or acute subluxation. Soft tissues and spinal canal: Bilateral common carotid atherosclerotic calcification. Disc levels:  No impingement identified. Upper chest: Unremarkable Other: No supplemental non-categorized findings. IMPRESSION: 1. No acute intracranial findings, acute cervical spine findings, or facial fracture. 2. Large bilateral periorbital hematomas. Left frontal scalp hematoma along the forehead. 3. Small remote left cerebellar infarcts. 4. Bilateral common carotid  atherosclerotic calcification. 5. Small bilateral C7 cervical ribs. Electronically Signed   By: Van Clines M.D.   On: 06/25/2020 12:40   CT Cervical Spine Wo Contrast  Result Date: 06/25/2020 CLINICAL DATA:  Fall on Saturday. Altered vision in both eyes. Bilateral periorbital hematomas and facial abrasions. EXAM: CT HEAD WITHOUT CONTRAST CT MAXILLOFACIAL WITHOUT CONTRAST CT CERVICAL SPINE WITHOUT CONTRAST TECHNIQUE: Multidetector CT imaging of the head, cervical spine, and maxillofacial structures were performed using the standard protocol without intravenous contrast. Multiplanar CT image reconstructions of the cervical spine and maxillofacial structures were also generated. COMPARISON:  MRI brain from 09/27/2018 FINDINGS: CT HEAD FINDINGS Brain: Small peripheral linear hypodensities in the left cerebellum, not changed from prior MRI, suggesting small remote left cerebellar infarcts. The brainstem, thalami, and basal ganglia appear unremarkable.  The ventricular system and basilar cisterns appear normal. No intracranial hemorrhage, mass lesion, or acute CVA. Vascular: There is atherosclerotic calcification of the cavernous carotid arteries bilaterally. Skull: Unremarkable Other: No supplemental non-categorized findings. CT MAXILLOFACIAL FINDINGS Osseous: No facial fracture is identified. Orbits: No intraorbital abnormality is observed. Sinuses: Unremarkable Soft tissues: Large bilateral periorbital hematomas. Left frontal scalp hematoma along the forehead. CT CERVICAL SPINE FINDINGS Alignment: No vertebral subluxation is observed. Skull base and vertebrae: Small bilateral C7 cervical ribs. No cervical spine fracture or acute subluxation. Soft tissues and spinal canal: Bilateral common carotid atherosclerotic calcification. Disc levels:  No impingement identified. Upper chest: Unremarkable Other: No supplemental non-categorized findings. IMPRESSION: 1. No acute intracranial findings, acute cervical spine  findings, or facial fracture. 2. Large bilateral periorbital hematomas. Left frontal scalp hematoma along the forehead. 3. Small remote left cerebellar infarcts. 4. Bilateral common carotid atherosclerotic calcification. 5. Small bilateral C7 cervical ribs. Electronically Signed   By: Van Clines M.D.   On: 06/25/2020 12:40   DG Hand Complete Left  Result Date: 06/25/2020 CLINICAL DATA:  Fall on Saturday and tried to catch self with left hand, complains of generalized lt hand pain EXAM: LEFT HAND - COMPLETE 3+ VIEW COMPARISON:  None. FINDINGS: Osteopenia. There is no evidence of fracture or dislocation. Scattered degenerative change. No focal bone lesion. Soft tissues are unremarkable. IMPRESSION: No acute osseous abnormality in the left hand. Electronically Signed   By: Audie Pinto M.D.   On: 06/25/2020 13:23   CT Maxillofacial Wo Contrast  Result Date: 06/25/2020 CLINICAL DATA:  Fall on Saturday. Altered vision in both eyes. Bilateral periorbital hematomas and facial abrasions. EXAM: CT HEAD WITHOUT CONTRAST CT MAXILLOFACIAL WITHOUT CONTRAST CT CERVICAL SPINE WITHOUT CONTRAST TECHNIQUE: Multidetector CT imaging of the head, cervical spine, and maxillofacial structures were performed using the standard protocol without intravenous contrast. Multiplanar CT image reconstructions of the cervical spine and maxillofacial structures were also generated. COMPARISON:  MRI brain from 09/27/2018 FINDINGS: CT HEAD FINDINGS Brain: Small peripheral linear hypodensities in the left cerebellum, not changed from prior MRI, suggesting small remote left cerebellar infarcts. The brainstem, thalami, and basal ganglia appear unremarkable. The ventricular system and basilar cisterns appear normal. No intracranial hemorrhage, mass lesion, or acute CVA. Vascular: There is atherosclerotic calcification of the cavernous carotid arteries bilaterally. Skull: Unremarkable Other: No supplemental non-categorized findings. CT  MAXILLOFACIAL FINDINGS Osseous: No facial fracture is identified. Orbits: No intraorbital abnormality is observed. Sinuses: Unremarkable Soft tissues: Large bilateral periorbital hematomas. Left frontal scalp hematoma along the forehead. CT CERVICAL SPINE FINDINGS Alignment: No vertebral subluxation is observed. Skull base and vertebrae: Small bilateral C7 cervical ribs. No cervical spine fracture or acute subluxation. Soft tissues and spinal canal: Bilateral common carotid atherosclerotic calcification. Disc levels:  No impingement identified. Upper chest: Unremarkable Other: No supplemental non-categorized findings. IMPRESSION: 1. No acute intracranial findings, acute cervical spine findings, or facial fracture. 2. Large bilateral periorbital hematomas. Left frontal scalp hematoma along the forehead. 3. Small remote left cerebellar infarcts. 4. Bilateral common carotid atherosclerotic calcification. 5. Small bilateral C7 cervical ribs. Electronically Signed   By: Van Clines M.D.   On: 06/25/2020 12:40    EKG: Not available  Assessment/Plan Active Problems:   Thrombocytopenia ([place])  (please populate well all problems here in Problem List. (For example, if patient is on BP meds at home and you resume or decide to hold them, it is a problem that needs to be her. Same for CAD, COPD, HLD and so  on)  Pancytopenia -Discussed with on-call oncology, with initial impression this is a relapse of aplastic anemia.  Oncology plans to come in to see the patient emergently this evening to decide diagnosis and treatment.  Right now there is no signs of active bleeding, will repeat CBC tomorrow.  Check FOBT, iron study reticulocyte count and LDH.  Oncology is also concerned about the patient has cirrhosis from prolonged alcohol abuse.  Ordered a RUQ ultrasound INR, LFT. -Avoid chemical DVT prophylaxis  Alcohol abuse -Since binge drinking only, CIWA protocol, as of now there is no symptoms signs of active  withdrawal  Anxiety -Continue Xanax as needed  Fall and chronic ambulatory dysfunction -PT evaluation  DVT prophylaxis: SCD Code Status: Full code Family Communication: Called Patient son x2, not picking up Disposition Plan: Patient has significant hematological abnormalities, likely will need more than 2 midnight hospital stay to stabilize. Consults called: Hematology Admission status: MedSurg admission  Lequita Halt MD [facility] Pager [phone]   06/25/2020, 4:46 PM

## 2020-06-25 NOTE — ED Notes (Signed)
Pt provided with [place] sandwich, chicken noodle soup.

## 2020-06-25 NOTE — TOC Initial Note (Signed)
Transition of Care [facility]) - Initial/Assessment Note    Patient Details  Name: Wendy Edwards MRN: [id number] Date of Birth: 09/11/1966  [facility]) CM/SW Contact:    Erenest Rasher, RN Phone Number: [phone]  06/25/2020, 6:51 PM  Clinical Narrative:                   TOC CM spoke pt at bedside. States she lives at home with adult son but he works 3rd shift. States she uses RW at home. She is agreeable to SNF and gave permission to create FL2 and fax to SNF rehab. Waiting PT evaluation.     Expected Discharge Plan: Skilled Nursing Facility Barriers to Discharge: Continued Medical Work up   Patient Goals and CMS Choice Patient states their goals for this hospitalization and ongoing recovery are:: will go to SNF rehab CMS Medicare.gov Compare Post Acute Care list provided to:: Patient Choice offered to / list presented to : Patient  Expected Discharge Plan and Services Expected Discharge Plan: [place] In-house Referral: Clinical Social Work Discharge Planning Services: CM Consult Post Acute Care Choice: [place] Living arrangements for the past 2 months: Apartment                                      Prior Living Arrangements/Services Living arrangements for the past 2 months: Apartment Lives with:: Adult Children Patient language and need for interpreter reviewed:: Yes Do you feel safe going back to the place where you live?: Yes      Need for Family Participation in Patient Care: Yes (Comment) Care giver support system in place?: Yes (comment) Current home services: DME (rolling walker, bedside commode, cane) Criminal Activity/Legal Involvement Pertinent to Current Situation/Hospitalization: No - Comment as needed  Activities of Daily Living      Permission Sought/Granted Permission sought to share information with : Case Manager, Facility Sport and exercise psychologist, PCP, Family Supports Permission granted to  share information with : Yes, Verbal Permission Granted  Share Information with NAME: Lucie Leather  Permission granted to share info w AGENCY: SNF, Home Health  Permission granted to share info w Relationship: son  Permission granted to share info w Contact Information: [phone]  Emotional Assessment Appearance:: Appears stated age Attitude/Demeanor/Rapport: Engaged Affect (typically observed): Accepting Orientation: : Oriented to Self, Oriented to Place, Oriented to  Time, Oriented to Situation      Admission diagnosis:  Thrombocytopenia [facility]) [D69.6] Patient Active Problem List   Diagnosis Date Noted  . Thrombocytopenia ([place]) 06/25/2020  . Fall   . Aplastic anemia ([place])   . Altered mental status 09/25/2018  . Metabolic acidosis 24/23/5361  . Malnutrition ([place]) 09/25/2018  . ETOH abuse 09/25/2018  . Sepsis ([place]) 09/25/2018  . COPD (chronic obstructive pulmonary disease) ([place]) 09/25/2018  . Chronic bilateral low back pain without sciatica 07/11/2018  . Screening for breast cancer 07/11/2018  . 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 Pain due to knee joint prosthesis ([place]), right 02/16/2017  . Hx of aplastic anemia 10/31/2012  . HTN (hypertension) 06/19/2012  . Tobacco abuse 05/18/2012   PCP:  Luetta Nutting, DO Pharmacy:   [id number] - [place], [place] [address] Phone: [phone] Fax: [phone]     Social Determinants of Health (SDOH) Interventions  Readmission Risk Interventions No flowsheet data found.

## 2020-06-25 NOTE — ED Triage Notes (Signed)
Pt BIBA from home-   Per EMS- Pt reports falling Saturday, called today d/t inability to see today ("Only see black X's' in left eye; r eye 'just blurs') Pt reports using walker, fell forward on sidewalk. Arrives with bilateral black eyes, hematoma to left eyebrow, abrasions to face, possible sprain to left.   AOx4.

## 2020-06-25 NOTE — ED Provider Notes (Signed)
[facility] DEPT Provider Note   CSN: [id number] Arrival date & time: 06/25/20  1014     History Chief Complaint  Patient presents with  . Fall    Wendy Edwards is a 54 y.o. female with PMH of aplastic anemia, DVT, HTN, and COPD who presents to the ED via EMS for vision loss subsequent to mechanical fall.  Patient reports that she was ambulating with a walker on 06/20/2020 when she fell forward and landed on her face.  Patient states that her fall was entirely mechanical in nature and she denies any LOC or memory disturbance.  She is not on any blood thinners.  She endorses reading glasses, but no corrective lenses for distance acuity.  She is also complaining of significant left hand pain, immediately inferior to her wrist.  She is endorsing diminished grip strength in the hand due to pain symptoms.  She reports that she did initially come to the ED because she "heals well", but given her progression in her swelling and visual changes, she wanted to come to the ED for assessment.  Patient is endorsing mild intermittent numbness in her hands, but denies any gait abnormality.  She is still able to ambulate with her walker.  She denies chest pain, difficulty breathing, abdominal pain, back pain, neck pain, dental injury, or other symptoms.  HPI     Past Medical History:  Diagnosis Date  . 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 Aplastic anemia ([place])    hx/notes 05/09/2007  . Arthritis    "right knee" (02/21/2017)  . Asthma   . Chronic anxiety    Archie Endo 02/04/2016  . COPD (chronic obstructive pulmonary disease) ([place])    hx/notes 05/09/2007  . Daily headache   . Depression   . DVT (deep venous thrombosis) ([facility])    hx upper and lower extremeties/notes 05/09/2007  . GERD (gastroesophageal reflux disease)   . Heart murmur 1974  . History of blood transfusion    "related to my  aplastic anemia"  . History of kidney stones   . Hypertension   . Migraine    "a few/year" (02/21/2017)  . Pneumonia 1974   hospitalized "w/double pneumonia"  . Serum sickness    hx/notes 05/09/2007  . Transaminitis    asymptomatic hx/notes 05/09/2007    Patient Active Problem List   Diagnosis Date Noted  . Altered mental status 09/25/2018  . Metabolic acidosis 78/29/5621  . Malnutrition ([place]) 09/25/2018  . ETOH abuse 09/25/2018  . Sepsis ([place]) 09/25/2018  . COPD (chronic obstructive pulmonary disease) ([place]) 09/25/2018  . Chronic bilateral low back pain without sciatica 07/11/2018  . Screening for breast cancer 07/11/2018  . 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 Pain due to knee joint prosthesis ([place]), right 02/16/2017  . Hx of aplastic anemia 10/31/2012  . HTN (hypertension) 06/19/2012  . Tobacco abuse 05/18/2012    Past Surgical History:  Procedure Laterality Date  . CYSTOSCOPY W/ URETEROSCOPY W/ LITHOTRIPSY    . EXTREMITY CYST EXCISION Left 2017   upper, inner arm  . KNEE ARTHROSCOPY Right 1985  . MEDIAL PARTIAL KNEE REPLACEMENT Right 2005  . TOTAL ABDOMINAL HYSTERECTOMY  2003   hx/notes 05/09/2007  . TOTAL KNEE REVISION Right 02/20/2017   Procedure: TOTAL KNEE REVISION;  Surgeon: Frederik Pear, MD;  Location: [facility];  Service: Orthopedics;  Laterality: Right;  . TUBAL LIGATION    . TUNNELED  VENOUS CATHETER PLACEMENT Right    hx/notes 05/09/2007; "took it out ~ 1 wk later"     OB History   No obstetric history on file.     Family History  Problem Relation Age of Onset  . Coronary artery disease Father 51       CABG  . Cancer Father   . Hypertension Mother   . Cancer Mother     Social History   Tobacco Use  . Smoking status: Current Every Day Smoker    Packs/day: 0.50    Years: 34.00    Pack years: 17.00    Types: Cigarettes  . Smokeless tobacco: Never Used  Vaping Use  . Vaping Use: Some days  Substance Use Topics  . Alcohol use: Yes     Alcohol/week: 12.0 standard drinks    Types: 12 Cans of beer per week    Comment: previously a heavy drinker - 6-7 beers daily; last drank maybe a week ago  . Drug use: No    Home Medications Prior to Admission medications   Medication Sig Start Date End Date Taking? Authorizing Provider  acetaminophen (TYLENOL) 500 MG tablet Take 1,000 mg by mouth every 6 (six) hours as needed for mild pain.   Yes [provider]  ALPRAZolam (XANAX) 1 MG tablet TAKE 1 TABLET BY MOUTH THREE TIMES A DAY AS NEEDED Patient taking differently: Take 1 mg by mouth 3 (three) times daily as needed.  06/09/20  Yes Ennever, Rudell Cobb, MD  albuterol (ACCUNEB) 1.25 MG/3ML nebulizer solution TAKE 3 ML(1 VIAL) BY NEBULIZATION 3 TIMES DAILY AS NEEDED FOR WHEEZING. Patient not taking: Reported on 06/25/2020 10/01/18   [place], Holli Humbles, NP  PROAIR HFA 108 (90 Base) MCG/ACT inhaler INHALE 2 PUFFS BY MOUTH EVERY 6 HOURS AS NEEDED Patient not taking: INHALE 2 PUFFS BY MOUTH EVERY 6 HOURS AS NEEDED 12/24/18   [place], Holli Humbles, NP    Allergies    Bee venom and Phenergan [promethazine]  Review of Systems   Review of Systems  All other systems reviewed and are negative.   Physical Exam Updated Vital Signs BP (!) 141/89   Pulse (!) 112   Temp 98.6 F (37 C) (Oral)   Resp 20   SpO2 96%   Physical Exam Vitals and nursing note reviewed. Exam conducted with a chaperone present.  Constitutional:      Appearance: Normal appearance.  HENT:     Head: Normocephalic.     Comments: Abrasion to left side of her forehead with associated hematoma.    Nose: Nose normal.     Mouth/Throat:     Comments: Patent oropharynx. Eyes:     Comments: Eyes swollen shut bilaterally, worse on left side.  Patient endorsing diminished acuity in right eye.  No hyphema.  No conjunctival injection.  No pupillary defect.  PERRL and EOM intact bilaterally.  Significant periorbital ecchymoses and swelling.  No pain with EOMs.  No  entrapment.  Neck:     Comments: ROM fully intact.  No midline cervical tenderness. Cardiovascular:     Rate and Rhythm: Regular rhythm. Tachycardia present.     Pulses: Normal pulses.     Heart sounds: Normal heart sounds.  Pulmonary:     Effort: Pulmonary effort is normal.  Abdominal:     Comments: No abdominal tenderness appreciated.  No bruising anywhere.  Nondistended, soft.  Musculoskeletal:     Cervical back: Normal range of motion. No rigidity.     Right  lower leg: No edema.     Left lower leg: No edema.  Skin:    General: Skin is dry.  Neurological:     Mental Status: She is alert.     GCS: GCS eye subscore is 4. GCS verbal subscore is 5. GCS motor subscore is 6.  Psychiatric:        Mood and Affect: Mood normal.        Behavior: Behavior normal.        Thought Content: Thought content normal.       ED Results / Procedures / Treatments   Labs (all labs ordered are listed, but only abnormal results are displayed) Labs Reviewed  BASIC METABOLIC PANEL - Abnormal; Notable for the following components:      Result Value   CO2 19 (*)    BUN 23 (*)    All other components within normal limits  CBC WITH DIFFERENTIAL/PLATELET - Abnormal; Notable for the following components:   WBC 2.3 (*)    RBC 2.24 (*)    Hemoglobin 8.2 (*)    HCT 25.7 (*)    MCV 114.7 (*)    MCH 36.6 (*)    Platelets 10 (*)    Neutro Abs 0.5 (*)    All other components within normal limits  SARS CORONAVIRUS 2 BY RT PCR (HOSPITAL ORDER, [facility])  CBC WITH DIFFERENTIAL/PLATELET    EKG None  Radiology CT Head Wo Contrast  Result Date: 06/25/2020 CLINICAL DATA:  Fall on Saturday. Altered vision in both eyes. Bilateral periorbital hematomas and facial abrasions. EXAM: CT HEAD WITHOUT CONTRAST CT MAXILLOFACIAL WITHOUT CONTRAST CT CERVICAL SPINE WITHOUT CONTRAST TECHNIQUE: Multidetector CT imaging of the head, cervical spine, and maxillofacial structures were  performed using the standard protocol without intravenous contrast. Multiplanar CT image reconstructions of the cervical spine and maxillofacial structures were also generated. COMPARISON:  MRI brain from 09/27/2018 FINDINGS: CT HEAD FINDINGS Brain: Small peripheral linear hypodensities in the left cerebellum, not changed from prior MRI, suggesting small remote left cerebellar infarcts. The brainstem, thalami, and basal ganglia appear unremarkable. The ventricular system and basilar cisterns appear normal. No intracranial hemorrhage, mass lesion, or acute CVA. Vascular: There is atherosclerotic calcification of the cavernous carotid arteries bilaterally. Skull: Unremarkable Other: No supplemental non-categorized findings. CT MAXILLOFACIAL FINDINGS Osseous: No facial fracture is identified. Orbits: No intraorbital abnormality is observed. Sinuses: Unremarkable Soft tissues: Large bilateral periorbital hematomas. Left frontal scalp hematoma along the forehead. CT CERVICAL SPINE FINDINGS Alignment: No vertebral subluxation is observed. Skull base and vertebrae: Small bilateral C7 cervical ribs. No cervical spine fracture or acute subluxation. Soft tissues and spinal canal: Bilateral common carotid atherosclerotic calcification. Disc levels:  No impingement identified. Upper chest: Unremarkable Other: No supplemental non-categorized findings. IMPRESSION: 1. No acute intracranial findings, acute cervical spine findings, or facial fracture. 2. Large bilateral periorbital hematomas. Left frontal scalp hematoma along the forehead. 3. Small remote left cerebellar infarcts. 4. Bilateral common carotid atherosclerotic calcification. 5. Small bilateral C7 cervical ribs. Electronically Signed   By: Van Clines M.D.   On: 06/25/2020 12:40   CT Cervical Spine Wo Contrast  Result Date: 06/25/2020 CLINICAL DATA:  Fall on Saturday. Altered vision in both eyes. Bilateral periorbital hematomas and facial abrasions. EXAM: CT  HEAD WITHOUT CONTRAST CT MAXILLOFACIAL WITHOUT CONTRAST CT CERVICAL SPINE WITHOUT CONTRAST TECHNIQUE: Multidetector CT imaging of the head, cervical spine, and maxillofacial structures were performed using the standard protocol without intravenous contrast. Multiplanar CT image  reconstructions of the cervical spine and maxillofacial structures were also generated. COMPARISON:  MRI brain from 09/27/2018 FINDINGS: CT HEAD FINDINGS Brain: Small peripheral linear hypodensities in the left cerebellum, not changed from prior MRI, suggesting small remote left cerebellar infarcts. The brainstem, thalami, and basal ganglia appear unremarkable. The ventricular system and basilar cisterns appear normal. No intracranial hemorrhage, mass lesion, or acute CVA. Vascular: There is atherosclerotic calcification of the cavernous carotid arteries bilaterally. Skull: Unremarkable Other: No supplemental non-categorized findings. CT MAXILLOFACIAL FINDINGS Osseous: No facial fracture is identified. Orbits: No intraorbital abnormality is observed. Sinuses: Unremarkable Soft tissues: Large bilateral periorbital hematomas. Left frontal scalp hematoma along the forehead. CT CERVICAL SPINE FINDINGS Alignment: No vertebral subluxation is observed. Skull base and vertebrae: Small bilateral C7 cervical ribs. No cervical spine fracture or acute subluxation. Soft tissues and spinal canal: Bilateral common carotid atherosclerotic calcification. Disc levels:  No impingement identified. Upper chest: Unremarkable Other: No supplemental non-categorized findings. IMPRESSION: 1. No acute intracranial findings, acute cervical spine findings, or facial fracture. 2. Large bilateral periorbital hematomas. Left frontal scalp hematoma along the forehead. 3. Small remote left cerebellar infarcts. 4. Bilateral common carotid atherosclerotic calcification. 5. Small bilateral C7 cervical ribs. Electronically Signed   By: Van Clines M.D.   On: 06/25/2020  12:40   DG Hand Complete Left  Result Date: 06/25/2020 CLINICAL DATA:  Fall on Saturday and tried to catch self with left hand, complains of generalized lt hand pain EXAM: LEFT HAND - COMPLETE 3+ VIEW COMPARISON:  None. FINDINGS: Osteopenia. There is no evidence of fracture or dislocation. Scattered degenerative change. No focal bone lesion. Soft tissues are unremarkable. IMPRESSION: No acute osseous abnormality in the left hand. Electronically Signed   By: Audie Pinto M.D.   On: 06/25/2020 13:23   CT Maxillofacial Wo Contrast  Result Date: 06/25/2020 CLINICAL DATA:  Fall on Saturday. Altered vision in both eyes. Bilateral periorbital hematomas and facial abrasions. EXAM: CT HEAD WITHOUT CONTRAST CT MAXILLOFACIAL WITHOUT CONTRAST CT CERVICAL SPINE WITHOUT CONTRAST TECHNIQUE: Multidetector CT imaging of the head, cervical spine, and maxillofacial structures were performed using the standard protocol without intravenous contrast. Multiplanar CT image reconstructions of the cervical spine and maxillofacial structures were also generated. COMPARISON:  MRI brain from 09/27/2018 FINDINGS: CT HEAD FINDINGS Brain: Small peripheral linear hypodensities in the left cerebellum, not changed from prior MRI, suggesting small remote left cerebellar infarcts. The brainstem, thalami, and basal ganglia appear unremarkable. The ventricular system and basilar cisterns appear normal. No intracranial hemorrhage, mass lesion, or acute CVA. Vascular: There is atherosclerotic calcification of the cavernous carotid arteries bilaterally. Skull: Unremarkable Other: No supplemental non-categorized findings. CT MAXILLOFACIAL FINDINGS Osseous: No facial fracture is identified. Orbits: No intraorbital abnormality is observed. Sinuses: Unremarkable Soft tissues: Large bilateral periorbital hematomas. Left frontal scalp hematoma along the forehead. CT CERVICAL SPINE FINDINGS Alignment: No vertebral subluxation is observed. Skull base and  vertebrae: Small bilateral C7 cervical ribs. No cervical spine fracture or acute subluxation. Soft tissues and spinal canal: Bilateral common carotid atherosclerotic calcification. Disc levels:  No impingement identified. Upper chest: Unremarkable Other: No supplemental non-categorized findings. IMPRESSION: 1. No acute intracranial findings, acute cervical spine findings, or facial fracture. 2. Large bilateral periorbital hematomas. Left frontal scalp hematoma along the forehead. 3. Small remote left cerebellar infarcts. 4. Bilateral common carotid atherosclerotic calcification. 5. Small bilateral C7 cervical ribs. Electronically Signed   By: Van Clines M.D.   On: 06/25/2020 12:40    Procedures Procedures (including critical care time)  Medications Ordered in ED Medications - No data to display  ED Course  I have reviewed the triage vital signs and the nursing notes.  Pertinent labs & imaging results that were available during my care of the patient were reviewed by me and considered in my medical decision making (see chart for details).  Clinical Course as of Jun 26 1551  Thu Jun 25, 2020  1550 Spoke with Dr. Roosevelt Locks who will admit patient in context of her thrombocytopenia for serial blood testing and will consult with hematology.   [GG]    Clinical Course User Index [GG] Corena Herter, PA-C   MDM Rules/Calculators/A&P                          Patient's presents to the ED with complaints of periorbital swelling and blurred vision in the setting of facial trauma.  She has raccoon eyes on physical exam concerning for basilar skull fracture.  Will obtain CT imaging of head, cervical spine, and maxillofacial.  She is also complaining of significant left hand discomfort is overlying ecchymoses.  Will obtain imaging of left hand to assess for osseous abnormalities.  Labs CBC: Leukopenia to 2.3.  Anemia with hemoglobin 8.2.  Elevated MCV reflective of macrocytic anemia.  Critical  thrombocytopenia to 10,000 platelets. BMP: Unremarkable.  Imaging CT imaging of head, cervical spine, and maxillofacial were personally reviewed and largely unremarkable.  There are large bilateral periorbital hematomas noted, but no evidence of fracture or other acute abnormalities. DG hand complete left is personally reviewed and demonstrates no acute osseous abnormalities.    I reviewed patient's medical record and she has been persistently tachycardic in her last couple of encounters, as well.  She is declining chest pain, shortness of breath, palpitations, unilateral extremity edema, abdominal discomfort, cough, pleuritic symptoms, nausea, or other symptoms that might otherwise be concerning in setting of tachycardia.  Patient's CBC is notable for a new leukopenia to 2.3, anemia to 8.2 hemoglobin, macrocytosis of 114.7, and most notably a new thrombocytopenia to 10,000 platelets compared to most recent labs obtained 10 months ago.  This is consistent with her history of aplastic anemia.  Patient does not have excellent outpatient support.  She states that she used to have a primary care provider, Dr. Zigmund Daniel, but that he has since moved.  She had not seen him in several years.  Given these laboratory findings, feel as though patient should be admitted for serial testing in the context of recent trauma and severe thrombocytopenia as she may ultimately require transfusion.  I think that she would benefit from serial CBCs.  She has been persistently tachycardic here in the ED, otherwise stable.  Discussed with attending, Dr. Ron Parker, who agrees with assessment and plan.    Spoke with Dr. Roosevelt Locks who will admit patient in context of her thrombocytopenia for serial blood testing and will consult with hematology.  Patient may ultimately require transfusion.   Final Clinical Impression(s) / ED Diagnoses Final diagnoses:  Fall, initial encounter  Aplastic anemia [facility])    Rx / DC Orders ED Discharge  Orders    None       Corena Herter, PA-C 06/25/20 1552    Breck Coons, MD 06/26/20 [phone]

## 2020-06-25 NOTE — ED Notes (Signed)
Pure wick has been placed. Suction set to 45mmHg.  

## 2020-06-26 DIAGNOSIS — D510 Vitamin B12 deficiency anemia due to intrinsic factor deficiency: Principal | ICD-10-CM

## 2020-06-26 DIAGNOSIS — D61818 Other pancytopenia: Secondary | ICD-10-CM | POA: Diagnosis present

## 2020-06-26 DIAGNOSIS — F1010 Alcohol abuse, uncomplicated: Secondary | ICD-10-CM

## 2020-06-26 DIAGNOSIS — K219 Gastro-esophageal reflux disease without esophagitis: Secondary | ICD-10-CM | POA: Diagnosis present

## 2020-06-26 DIAGNOSIS — E538 Deficiency of other specified B group vitamins: Secondary | ICD-10-CM

## 2020-06-26 DIAGNOSIS — J449 Chronic obstructive pulmonary disease, unspecified: Secondary | ICD-10-CM

## 2020-06-26 DIAGNOSIS — F419 Anxiety disorder, unspecified: Secondary | ICD-10-CM | POA: Diagnosis present

## 2020-06-26 LAB — PREPARE PLATELET PHERESIS: Unit division: 0

## 2020-06-26 LAB — COMPREHENSIVE METABOLIC PANEL
ALT: 11 U/L (ref 0–44)
AST: 13 U/L — ABNORMAL LOW (ref 15–41)
Albumin: 3.5 g/dL (ref 3.5–5.0)
Alkaline Phosphatase: 86 U/L (ref 38–126)
Anion gap: 10 (ref 5–15)
BUN: 17 mg/dL (ref 6–20)
CO2: 23 mmol/L (ref 22–32)
Calcium: 8.6 mg/dL — ABNORMAL LOW (ref 8.9–10.3)
Chloride: 106 mmol/L (ref 98–111)
Creatinine, Ser: 0.47 mg/dL (ref 0.44–1.00)
GFR calc Af Amer: >60 mL/min (ref >60)
GFR calc non Af Amer: >60 mL/min (ref >60)
Glucose, Bld: 76 mg/dL (ref 70–99)
Potassium: 3.8 mmol/L (ref 3.5–5.1)
Sodium: 139 mmol/L (ref 135–145)
Total Bilirubin: 0.8 mg/dL (ref 0.3–1.2)
Total Protein: 6.2 g/dL — ABNORMAL LOW (ref 6.5–8.1)

## 2020-06-26 LAB — LACTATE DEHYDROGENASE: LDH: 143 U/L (ref 98–192)

## 2020-06-26 LAB — BPAM PLATELET PHERESIS
Blood Product Expiration Date: 202107112359
ISSUE DATE / TIME: 202107082003
Unit Type and Rh: 6200

## 2020-06-26 LAB — CBC
HCT: 22.1 % — ABNORMAL LOW (ref 36.0–46.0)
Hemoglobin: 7.3 g/dL — ABNORMAL LOW (ref 12.0–15.0)
MCH: 38.2 pg — ABNORMAL HIGH (ref 26.0–34.0)
MCHC: 33 g/dL (ref 30.0–36.0)
MCV: 115.7 fL — ABNORMAL HIGH (ref 80.0–100.0)
Platelets: 15 10*3/uL — CL (ref 150–400)
RBC: 1.91 MIL/uL — ABNORMAL LOW (ref 3.87–5.11)
RDW: 14.6 % (ref 11.5–15.5)
WBC: 2.2 10*3/uL — ABNORMAL LOW (ref 4.0–10.5)
nRBC: 0.9 % — ABNORMAL HIGH (ref 0.0–0.2)

## 2020-06-26 MED ORDER — CYANOCOBALAMIN 1000 MCG/ML IJ SOLN
1000.0000 ug | Freq: Every day | INTRAMUSCULAR | Status: DC
  Filled 2020-06-26: qty 1

## 2020-06-26 MED ORDER — IPRATROPIUM-ALBUTEROL 0.5-2.5 (3) MG/3ML IN SOLN: 3.0000 mL | RESPIRATORY_TRACT | Status: DC | PRN

## 2020-06-26 MED ORDER — MOMETASONE FURO-FORMOTEROL FUM 100-5 MCG/ACT IN AERO
2.0000 | INHALATION_SPRAY | Freq: Two times a day (BID) | RESPIRATORY_TRACT | Status: DC
  Administered 2020-06-26 – 2020-06-30 (×9): 2 via RESPIRATORY_TRACT
  Filled 2020-06-26: qty 8.8

## 2020-06-26 MED ORDER — CYANOCOBALAMIN 1000 MCG/ML IJ SOLN
1000.0000 ug | Freq: Every day | INTRAMUSCULAR | Status: DC
  Administered 2020-06-26 – 2020-06-30 (×5): 1000 ug via INTRAMUSCULAR
  Filled 2020-06-26 (×5): qty 1

## 2020-06-26 MED ORDER — PANTOPRAZOLE SODIUM 40 MG PO TBEC
40.0000 mg | DELAYED_RELEASE_TABLET | Freq: Every day | ORAL | Status: DC
  Administered 2020-06-26 – 2020-06-30 (×5): 40 mg via ORAL
  Filled 2020-06-26 (×6): qty 1

## 2020-06-26 MED ORDER — UMECLIDINIUM BROMIDE 62.5 MCG/INH IN AEPB
1.0000 | INHALATION_SPRAY | Freq: Every day | RESPIRATORY_TRACT | Status: DC
  Administered 2020-06-27 – 2020-06-30 (×5): 1 via RESPIRATORY_TRACT
  Filled 2020-06-26: qty 7

## 2020-06-26 MED ORDER — ACETAMINOPHEN 325 MG PO TABS
650.0000 mg | ORAL_TABLET | ORAL | Status: DC | PRN
  Administered 2020-06-26 – 2020-06-30 (×10): 650 mg via ORAL
  Filled 2020-06-26 (×10): qty 2

## 2020-06-26 NOTE — Progress Notes (Addendum)
I think we have a diagnosis now.  Her vitamin B12 level is incredibly low.  Her vitamin B12 level is only 83.  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  I will cancel the bone marrow biopsy.  We will give her vitamin B12 supplementation.  I will send off some studies to confirm B12 deficiency.  We will send off methylmalonic acid.  We will send off intrinsic factor antibodies and parietal cell antibodies.  Again I am a little surprised that I really do not see anything with her blood smear that looks like B12 deficiency.  She does have a high MCV.  Her LDH is normal.  Her hemoglobin today is 7.3.  Platelet count 15,000.  White cell count 2.2.  MCV is 116.  I do not think we have to transfuse her today.  Her vital signs were all stable.  Blood pressure is 121/79.  There really is no change in her physical examination.  She still has the ecchymoses about her eyes.  Hopefully, we will be able to see her blood count start going up.  I probably would have to keep her in the hospital for a few days just to make sure that we are seeing a response to the B12 supplementation.  Again, I do not see that we have to do a bone marrow biopsy on her right now.  Certainly, pernicious anemia would be the easiest thing to treat.  I told her that she MUST stop drinking.  I think that her alcohol intake is playing a role with all of this.  She understands this.  He says that she will stop.  I appreciate the great care that she is getting from the staff on 6 E.   Lattie Haw, MD  2 Corinthians 10:17

## 2020-06-26 NOTE — Progress Notes (Signed)
PROGRESS NOTE    Wendy Edwards  [name] DOB: [birth year] DOA: 06/25/2020 PCP: No primary care provider on file.    Chief Complaint  Patient presents with  . Fall    Brief Narrative:  HPI per Dr. [name] Wendy FRANQUI is a 54 y.o. female with medical history significant of remote Hx of aplastic anemia, chronic ambulation dysfunction using walker, anxiety/depression, binge alcohol drinking, presented with mechanical fall and severe abuse.  Patient reported 4 days ago, she tripped over Stairs at home, fell forward and hit her face.  She sustained some previous on her forehead and both eyes.  She has some headache initially denied any blurred vision lightheadedness.  Over the last 3 days she has noticed the post has not improved, she also reported she developed a rash over her bilateral shin area for more than 2 to 3 weeks.  She admits to drinking beer almost every other day, but never more than 12 ounces.  And she was never in alcohol withdrawal before.  She further denied any easy bruising, no hematuria, no bloody stool or dark-colored stool no abdominal pain.  She further denied any chest pain no short of breath.  ED Course: Blood work shows pancytopenia, platelet 10, hemoglobin 8.2 from 14.9 last year, WBC 2.3  Assessment & Plan:   Principal Problem:   Pancytopenia ([place]) Active Problems:   ETOH abuse   COPD (chronic obstructive pulmonary disease) (HCC)   Thrombocytopenia (HCC)   Anxiety   GERD (gastroesophageal reflux disease)   Vitamin B12 deficiency  1 pancytopenia Questionable etiology.  Concern likely secondary to pernicious anemia as patient with a significant vitamin B12 deficiency.  Patient status post 1 unit platelet transfusion as patient had presented with a fall and noted to have ecchymosis around her eyes.  Patient with prior history of aplastic anemia and initial concern was a relapse of aplastic anemia.  Patient seen in consultation by oncology who looked at  patient's blood smear and did not notice any leukemic cells.  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  Methylmalonic acid, intrinsic factor antibodies, parietal cell antibodies have been ordered and currently pending.  LDH within normal limits.  Patient with no noted bleeding.  CBC today with a white count of 2.2, hemoglobin of 7.3, platelet count of 15.  Transfusion threshold hemoglobin < 7, platelet count < 10, or platelet count <20 with active bleeding.  Patient has been started on vitamin B12 IM injections daily and folic acid per hematology/oncology.  Per hematology oncology no need for bone marrow biopsy at this time and as such bone marrow biopsy order has been canceled.  Appreciate hematology/oncology input and recommendations.  Follow.  2.  Vitamin B12 deficiency/pernicious anemia See problem #1.  Patient started on vitamin B12 IM injections daily and folic acid.  3.  Alcohol abuse Alcohol cessation stressed to patient.  Patient with no signs of DTs.  Continue the Ativan withdrawal protocol.  Continue thiamine, folic acid, multivitamin.  Supportive care.  4.  Anxiety Xanax as needed.  5.  Chronic ambulatory dysfunction/fall PT/OT.  6.  Gastroesophageal reflux disease PPI.  7.  COPD Stable.  Place on [place], [place].  Duo nebs as needed.   DVT prophylaxis: SCDs Code Status: Full Family Communication: Updated patient.  No family at bedside. Disposition:   Status  is: Inpatient    Dispo: The patient is from: Home              Anticipated d/c is to: Home              Anticipated d/c date is: 4 to 5 days.              Patient currently not medically stable for discharge.  Patient with a severe pancytopenia.  Oncology following.       Consultants:   Oncology: Dr. Marin Olp  06/25/2020  Procedures:   Transfusion 1 unit platelets 06/25/2020  CT head/CT C-spine/CT maxillofacial 06/25/2020  Plain films of the left hand 06/25/2020  Right upper quadrant ultrasound 06/25/2020  Antimicrobials:   None   Subjective: Patient laying in bed.  Denies any chest pain.  No shortness of breath.  Objective: Vitals:   06/26/20 0104 06/26/20 0544 06/26/20 0932 06/26/20 1321  BP: 124/78 121/79 (!) 143/71 127/68  Pulse: 74 90 (!) 106 97  Resp: '14 14 15   ' Temp: 98.2 F (36.8 C) 98.4 F (36.9 C) 98.3 F (36.8 C) 99 F (37.2 C)  TempSrc: Oral Oral Oral Oral  SpO2: 98% 98% 98% 100%  Weight:      Height:       No intake or output data in the 24 hours ending 06/26/20 1834 Filed Weights   06/25/20 2307  Weight: 55.4 kg    Examination:  General exam: NAD.  Ecchymosis around eyes. Respiratory system: Clear to auscultation. Respiratory effort normal. Cardiovascular system: S1 & S2 heard, RRR. No JVD, murmurs, rubs, gallops or clicks. No pedal edema. Gastrointestinal system: Abdomen is nondistended, soft and nontender. No organomegaly or masses felt. Normal bowel sounds heard. Central nervous system: Alert and oriented. No focal neurological deficits. Extremities: Symmetric 5 x 5 power. Skin: No rashes, lesions or ulcers Psychiatry: Judgement and insight appear normal. Mood & affect appropriate.     Data Reviewed: I have personally reviewed following labs and imaging studies  CBC: Recent Labs  Lab 06/25/20 1224 06/26/20 0507  WBC 2.3* 2.2*  NEUTROABS 0.5*  --   HGB 8.2* 7.3*  HCT 25.7* 22.1*  MCV 114.7* 115.7*  PLT 10* 15*    Basic Metabolic Panel: Recent Labs  Lab 06/25/20 1157 06/26/20 0507  NA 138 139  K 5.0 3.8  CL 104 106  CO2 19* 23  GLUCOSE 91 76  BUN 23* 17  CREATININE 0.58 0.47  CALCIUM 8.9 8.6*    GFR: Estimated Creatinine Clearance: 65.1 mL/min (by C-G formula based on SCr of 0.47 mg/dL).  Liver Function Tests: Recent Labs  Lab  06/25/20 1657 06/26/20 0507  AST 15 13*  ALT 13 11  ALKPHOS 113 86  BILITOT 0.5 0.8  PROT 7.0 6.2*  ALBUMIN 4.0 3.5    CBG: No results for input(s): GLUCAP in the last 168 hours.   Recent Results (from the past 240 hour(s))  SARS Coronavirus 2 by RT PCR (hospital order, performed in [facility] hospital lab) Nasopharyngeal Nasopharyngeal Swab     Status: None   Collection Time: 06/25/20  4:57 PM   Specimen: Nasopharyngeal Swab  Result Value Ref Range Status   SARS Coronavirus 2 NEGATIVE NEGATIVE Final    Comment: (NOTE) SARS-CoV-2 target nucleic acids are NOT DETECTED.  The SARS-CoV-2 RNA is generally detectable in upper and lower respiratory specimens during the acute phase of infection. The lowest concentration of SARS-CoV-2 viral copies this assay can detect is 250 copies /  mL. A negative result does not preclude SARS-CoV-2 infection and should not be used as the sole basis for treatment or other patient management decisions.  A negative result may occur with improper specimen collection / handling, submission of specimen other than nasopharyngeal swab, presence of viral mutation(s) within the areas targeted by this assay, and inadequate number of viral copies (<250 copies / mL). A negative result must be combined with clinical observations, patient history, and epidemiological information.  Fact Sheet for Patients:   StrictlyIdeas.no  Fact Sheet for Healthcare Providers: BankingDealers.co.za  This test is not yet approved or  cleared by the [place] FDA and has been authorized for detection and/or diagnosis of SARS-CoV-2 by FDA under an Emergency Use Authorization (EUA).  This EUA will remain in effect (meaning this test can be used) for the duration of the COVID-19 declaration under Section 564(b)(1) of the Act, 21 U.S.C. section 360bbb-3(b)(1), unless the authorization is terminated or revoked  sooner.  Performed at [facility], [place] [address], [place], [place] [address]          Radiology Studies: CT Head Wo Contrast  Result Date: 06/25/2020 CLINICAL DATA:  Fall on Saturday. Altered vision in both eyes. Bilateral periorbital hematomas and facial abrasions. EXAM: CT HEAD WITHOUT CONTRAST CT MAXILLOFACIAL WITHOUT CONTRAST CT CERVICAL SPINE WITHOUT CONTRAST TECHNIQUE: Multidetector CT imaging of the head, cervical spine, and maxillofacial structures were performed using the standard protocol without intravenous contrast. Multiplanar CT image reconstructions of the cervical spine and maxillofacial structures were also generated. COMPARISON:  MRI brain from 09/27/2018 FINDINGS: CT HEAD FINDINGS Brain: Small peripheral linear hypodensities in the left cerebellum, not changed from prior MRI, suggesting small remote left cerebellar infarcts. The brainstem, thalami, and basal ganglia appear unremarkable. The ventricular system and basilar cisterns appear normal. No intracranial hemorrhage, mass lesion, or acute CVA. Vascular: There is atherosclerotic calcification of the cavernous carotid arteries bilaterally. Skull: Unremarkable Other: No supplemental non-categorized findings. CT MAXILLOFACIAL FINDINGS Osseous: No facial fracture is identified. Orbits: No intraorbital abnormality is observed. Sinuses: Unremarkable Soft tissues: Large bilateral periorbital hematomas. Left frontal scalp hematoma along the forehead. CT CERVICAL SPINE FINDINGS Alignment: No vertebral subluxation is observed. Skull base and vertebrae: Small bilateral C7 cervical ribs. No cervical spine fracture or acute subluxation. Soft tissues and spinal canal: Bilateral common carotid atherosclerotic calcification. Disc levels:  No impingement identified. Upper chest: Unremarkable Other: No supplemental non-categorized findings. IMPRESSION: 1. No acute intracranial findings, acute cervical spine findings, or facial  fracture. 2. Large bilateral periorbital hematomas. Left frontal scalp hematoma along the forehead. 3. Small remote left cerebellar infarcts. 4. Bilateral common carotid atherosclerotic calcification. 5. Small bilateral C7 cervical ribs. Electronically Signed   By: Van Clines M.D.   On: 06/25/2020 12:40   CT Cervical Spine Wo Contrast  Result Date: 06/25/2020 CLINICAL DATA:  Fall on Saturday. Altered vision in both eyes. Bilateral periorbital hematomas and facial abrasions. EXAM: CT HEAD WITHOUT CONTRAST CT MAXILLOFACIAL WITHOUT CONTRAST CT CERVICAL SPINE WITHOUT CONTRAST TECHNIQUE: Multidetector CT imaging of the head, cervical spine, and maxillofacial structures were performed using the standard protocol without intravenous contrast. Multiplanar CT image reconstructions of the cervical spine and maxillofacial structures were also generated. COMPARISON:  MRI brain from 09/27/2018 FINDINGS: CT HEAD FINDINGS Brain: Small peripheral linear hypodensities in the left cerebellum, not changed from prior MRI, suggesting small remote left cerebellar infarcts. The brainstem, thalami, and basal ganglia appear unremarkable. The ventricular system and basilar cisterns appear normal. No intracranial  hemorrhage, mass lesion, or acute CVA. Vascular: There is atherosclerotic calcification of the cavernous carotid arteries bilaterally. Skull: Unremarkable Other: No supplemental non-categorized findings. CT MAXILLOFACIAL FINDINGS Osseous: No facial fracture is identified. Orbits: No intraorbital abnormality is observed. Sinuses: Unremarkable Soft tissues: Large bilateral periorbital hematomas. Left frontal scalp hematoma along the forehead. CT CERVICAL SPINE FINDINGS Alignment: No vertebral subluxation is observed. Skull base and vertebrae: Small bilateral C7 cervical ribs. No cervical spine fracture or acute subluxation. Soft tissues and spinal canal: Bilateral common carotid atherosclerotic calcification. Disc levels:   No impingement identified. Upper chest: Unremarkable Other: No supplemental non-categorized findings. IMPRESSION: 1. No acute intracranial findings, acute cervical spine findings, or facial fracture. 2. Large bilateral periorbital hematomas. Left frontal scalp hematoma along the forehead. 3. Small remote left cerebellar infarcts. 4. Bilateral common carotid atherosclerotic calcification. 5. Small bilateral C7 cervical ribs. Electronically Signed   By: Van Clines M.D.   On: 06/25/2020 12:40   DG Hand Complete Left  Result Date: 06/25/2020 CLINICAL DATA:  Fall on Saturday and tried to catch self with left hand, complains of generalized lt hand pain EXAM: LEFT HAND - COMPLETE 3+ VIEW COMPARISON:  None. FINDINGS: Osteopenia. There is no evidence of fracture or dislocation. Scattered degenerative change. No focal bone lesion. Soft tissues are unremarkable. IMPRESSION: No acute osseous abnormality in the left hand. Electronically Signed   By: Audie Pinto M.D.   On: 06/25/2020 13:23   CT Maxillofacial Wo Contrast  Result Date: 06/25/2020 CLINICAL DATA:  Fall on Saturday. Altered vision in both eyes. Bilateral periorbital hematomas and facial abrasions. EXAM: CT HEAD WITHOUT CONTRAST CT MAXILLOFACIAL WITHOUT CONTRAST CT CERVICAL SPINE WITHOUT CONTRAST TECHNIQUE: Multidetector CT imaging of the head, cervical spine, and maxillofacial structures were performed using the standard protocol without intravenous contrast. Multiplanar CT image reconstructions of the cervical spine and maxillofacial structures were also generated. COMPARISON:  MRI brain from 09/27/2018 FINDINGS: CT HEAD FINDINGS Brain: Small peripheral linear hypodensities in the left cerebellum, not changed from prior MRI, suggesting small remote left cerebellar infarcts. The brainstem, thalami, and basal ganglia appear unremarkable. The ventricular system and basilar cisterns appear normal. No intracranial hemorrhage, mass lesion, or acute CVA.  Vascular: There is atherosclerotic calcification of the cavernous carotid arteries bilaterally. Skull: Unremarkable Other: No supplemental non-categorized findings. CT MAXILLOFACIAL FINDINGS Osseous: No facial fracture is identified. Orbits: No intraorbital abnormality is observed. Sinuses: Unremarkable Soft tissues: Large bilateral periorbital hematomas. Left frontal scalp hematoma along the forehead. CT CERVICAL SPINE FINDINGS Alignment: No vertebral subluxation is observed. Skull base and vertebrae: Small bilateral C7 cervical ribs. No cervical spine fracture or acute subluxation. Soft tissues and spinal canal: Bilateral common carotid atherosclerotic calcification. Disc levels:  No impingement identified. Upper chest: Unremarkable Other: No supplemental non-categorized findings. IMPRESSION: 1. No acute intracranial findings, acute cervical spine findings, or facial fracture. 2. Large bilateral periorbital hematomas. Left frontal scalp hematoma along the forehead. 3. Small remote left cerebellar infarcts. 4. Bilateral common carotid atherosclerotic calcification. 5. Small bilateral C7 cervical ribs. Electronically Signed   By: Van Clines M.D.   On: 06/25/2020 12:40   US Abdomen Limited RUQ  Result Date: 06/25/2020 CLINICAL DATA:  54 year old female with cirrhosis.  Recent fall. EXAM: ULTRASOUND ABDOMEN LIMITED RIGHT UPPER QUADRANT COMPARISON:  CT of the abdomen pelvis dated 01/13/2019. FINDINGS: Gallbladder: No gallstones or wall thickening visualized. No sonographic Murphy sign noted by sonographer. Common bile duct: Diameter: 4 mm Liver: There is coarsened liver echotexture with increased echogenicity and slight  surface nodularity in keeping with history of cirrhosis. Portal vein is patent on color Doppler imaging with normal direction of blood flow towards the liver. Other: None. IMPRESSION: 1. Morphologic changes of of the liver in keeping with provided history of cirrhosis. 2. Patent main portal  vein with hepatopetal flow. 3. No ascites. Electronically Signed   By: Anner Crete M.D.   On: 06/25/2020 19:02        Scheduled Meds: . sodium chloride   Intravenous Once  . cyanocobalamin  1,000 mcg Intramuscular Daily  . folic acid  1 mg Oral Daily  . multivitamin with minerals  1 tablet Oral Daily  . pantoprazole  40 mg Oral Q0600  . thiamine  100 mg Oral Daily   Or  . thiamine  100 mg Intravenous Daily   Continuous Infusions:   LOS: 1 day    Time spent: 40 minutes    Irine Seal, MD [facility]   To contact the attending provider between 7A-7P or the covering provider during after hours 7P-7A, please log into the web site [email or web address] and access using universal [place] password for that web site. If you do not have the password, please call the hospital operator.  06/26/2020, 6:34 PM

## 2020-06-26 NOTE — Evaluation (Signed)
Physical Therapy Evaluation Patient Details Name: Wendy Edwards MRN: [id number] DOB: 12/23/1965 Today's Date: 06/26/2020   History of Present Illness  Pt admitted with Thrombocytopenia, Pancytopenia and is s/p fall with extensive facial bruising and sprained wrist.  Pt with hx of R TKR, COPD, aplatic anemia and ETOH abuse  Clinical Impression  Pt admitted as above and presenting with functional mobility limitations 2* generalized weakness, ambulatory balance deficits, limited endurance and ac/o mild dizziness with OOB activity.  Pt hopes to progress to dc home with son.    Follow Up Recommendations Home health PT    Equipment Recommendations  None recommended by PT    Recommendations for Other Services OT consult     Precautions / Restrictions Precautions Precautions: Fall Restrictions Weight Bearing Restrictions: No      Mobility  Bed Mobility Overal bed mobility: Modified Independent             General bed mobility comments: Pt unassisted supine to sit  Transfers Overall transfer level: Needs assistance Equipment used: Rolling walker (2 wheeled) Transfers: Sit to/from Stand Sit to Stand: Min assist;Min guard         General transfer comment: Assist to bring wt up and fwd and steady assist to initially balance  Ambulation/Gait Ambulation/Gait assistance: Min assist;Min guard Gait Distance (Feet): 27 Feet Assistive device: Rolling walker (2 wheeled) Gait Pattern/deviations: Step-to pattern;Decreased step length - right;Decreased step length - left;Shuffle;Trunk flexed Gait velocity: decr   General Gait Details: cues for posture and position from RW; Steady assist; distance ltd by c/o mild dizziness  Stairs            Wheelchair Mobility    Modified Rankin (Stroke Patients Only)       Balance Overall balance assessment: Needs assistance Sitting-balance support: No upper extremity supported;Feet supported Sitting balance-Leahy Scale: Good      Standing balance support: Bilateral upper extremity supported Standing balance-Leahy Scale: Poor                               Pertinent Vitals/Pain Pain Assessment: No/denies pain    Home Living Family/patient expects to be discharged to:: Private residence Living Arrangements: Children Available Help at Discharge: Family;Available PRN/intermittently Type of Home: Apartment Home Access: Level entry     Home Layout: One level Home Equipment: [place] - 2 wheels;Cane - single point;Bedside commode;Shower seat;Grab bars - tub/shower Additional Comments: Pt lives with son who works late shift    Prior Function Level of Independence: Independent with assistive device(s)         Comments: Pt very sedentary and largely stays in bed     Hand Dominance   Dominant Hand: Right    Extremity/Trunk Assessment   Upper Extremity Assessment Upper Extremity Assessment: Generalized weakness;LUE deficits/detail LUE Deficits / Details: Wrist splint in place 2* sprain suffered in fall per pt    Lower Extremity Assessment Lower Extremity Assessment: Generalized weakness       Communication   Communication: No difficulties  Cognition Arousal/Alertness: Awake/alert Behavior During Therapy: WFL for tasks assessed/performed Overall Cognitive Status: Within Functional Limits for tasks assessed                                        General Comments      Exercises     Assessment/Plan    PT  Assessment Patient needs continued PT services  PT Problem List Decreased strength;Decreased activity tolerance;Decreased balance;Decreased mobility;Decreased knowledge of use of DME;Decreased safety awareness       PT Treatment Interventions DME instruction;Gait training;Functional mobility training;Therapeutic activities;Therapeutic exercise;Balance training;Patient/family education    PT Goals (Current goals can be found in the Care Plan section)  Acute Rehab  PT Goals Patient Stated Goal: Regain IND PT Goal Formulation: With patient Time For Goal Achievement: 07/10/20 Potential to Achieve Goals: Fair    Frequency Min 3X/week   Barriers to discharge Decreased caregiver support does not have full 24/7 assist; son works third shift; pt has a friend close by that assists intermittently    Co-evaluation               AM-PAC PT "6 Clicks" Mobility  Outcome Measure Help needed turning from your back to your side while in a flat bed without using bedrails?: None Help needed moving from lying on your back to sitting on the side of a flat bed without using bedrails?: None Help needed moving to and from a bed to a chair (including a wheelchair)?: A Little Help needed standing up from a chair using your arms (e.g., wheelchair or bedside chair)?: A Little Help needed to walk in hospital room?: A Little Help needed climbing 3-5 steps with a railing? : A Lot 6 Click Score: 19    End of Session Equipment Utilized During Treatment: Gait belt Activity Tolerance: Patient tolerated treatment well Patient left: in chair;with call bell/phone within reach;with chair alarm set Nurse Communication: Mobility status PT Visit Diagnosis: History of falling (Z91.81);Difficulty in walking, not elsewhere classified (R26.2);Muscle weakness (generalized) (M62.81)    Time: 5009-3818 PT Time Calculation (min) (ACUTE ONLY): 21 min   Charges:   PT Evaluation $PT Eval Low Complexity: 1 Low          [place] Pager [phone] Office [phone]   Darrelle Wiberg 06/26/2020, 11:37 AM

## 2020-06-27 DIAGNOSIS — K7030 Alcoholic cirrhosis of liver without ascites: Secondary | ICD-10-CM

## 2020-06-27 LAB — CBC WITH DIFFERENTIAL/PLATELET
Abs Immature Granulocytes: 0 10*3/uL (ref 0.00–0.07)
Basophils Absolute: 0 10*3/uL (ref 0.0–0.1)
Basophils Relative: 1 %
Eosinophils Absolute: 0 10*3/uL (ref 0.0–0.5)
Eosinophils Relative: 0 %
HCT: 21.7 % — ABNORMAL LOW (ref 36.0–46.0)
Hemoglobin: 7.2 g/dL — ABNORMAL LOW (ref 12.0–15.0)
Immature Granulocytes: 0 %
Lymphocytes Relative: 74 %
Lymphs Abs: 1.6 10*3/uL (ref 0.7–4.0)
MCH: 38.9 pg — ABNORMAL HIGH (ref 26.0–34.0)
MCHC: 33.2 g/dL (ref 30.0–36.0)
MCV: 117.3 fL — ABNORMAL HIGH (ref 80.0–100.0)
Monocytes Absolute: 0.4 10*3/uL (ref 0.1–1.0)
Monocytes Relative: 16 %
Neutro Abs: 0.2 10*3/uL — ABNORMAL LOW (ref 1.7–7.7)
Neutrophils Relative %: 9 %
Platelets: 16 10*3/uL — CL (ref 150–400)
RBC: 1.85 MIL/uL — ABNORMAL LOW (ref 3.87–5.11)
RDW: 14.6 % (ref 11.5–15.5)
WBC: 2.1 10*3/uL — ABNORMAL LOW (ref 4.0–10.5)
nRBC: 0.9 % — ABNORMAL HIGH (ref 0.0–0.2)

## 2020-06-27 LAB — LACTATE DEHYDROGENASE: LDH: 127 U/L (ref 98–192)

## 2020-06-27 LAB — HOMOCYSTEINE: Homocysteine: 12.1 umol/L (ref 0.0–14.5)

## 2020-06-27 LAB — COMPREHENSIVE METABOLIC PANEL
ALT: 10 U/L (ref 0–44)
AST: 13 U/L — ABNORMAL LOW (ref 15–41)
Albumin: 3.3 g/dL — ABNORMAL LOW (ref 3.5–5.0)
Alkaline Phosphatase: 91 U/L (ref 38–126)
Anion gap: 10 (ref 5–15)
BUN: 16 mg/dL (ref 6–20)
CO2: 22 mmol/L (ref 22–32)
Calcium: 8.6 mg/dL — ABNORMAL LOW (ref 8.9–10.3)
Chloride: 105 mmol/L (ref 98–111)
Creatinine, Ser: 0.51 mg/dL (ref 0.44–1.00)
GFR calc Af Amer: >60 mL/min (ref >60)
GFR calc non Af Amer: >60 mL/min (ref >60)
Glucose, Bld: 118 mg/dL — ABNORMAL HIGH (ref 70–99)
Potassium: 4.3 mmol/L (ref 3.5–5.1)
Sodium: 137 mmol/L (ref 135–145)
Total Bilirubin: 0.3 mg/dL (ref 0.3–1.2)
Total Protein: 6.1 g/dL — ABNORMAL LOW (ref 6.5–8.1)

## 2020-06-27 LAB — MAGNESIUM: Magnesium: 2.1 mg/dL (ref 1.7–2.4)

## 2020-06-27 LAB — ANTI-PARIETAL ANTIBODY: Parietal Cell Antibody-IgG: 4.1 Units (ref 0.0–20.0)

## 2020-06-27 NOTE — Progress Notes (Signed)
Physical Therapy Treatment Patient Details Name: Wendy Edwards MRN: [id number] DOB: 1966-06-27 Today's Date: 06/27/2020    History of Present Illness Pt admitted with Thrombocytopenia, Pancytopenia and is s/p fall with extensive facial bruising and sprained wrist.  Pt with hx of R TKR, COPD, aplatic anemia and ETOH abuse    PT Comments    Pt with slightly improved ambulation distance this day, requiring assist for RW use and hallway navigation. PT reporting LEs feel "heavy" today secondary to weakness, pt tolerated very limited LE exercise before fatiguing. PT continuing to recommend HHPT post-acutely.    Follow Up Recommendations  Home health PT     Equipment Recommendations  None recommended by PT    Recommendations for Other Services       Precautions / Restrictions Precautions Precautions: Fall Restrictions Weight Bearing Restrictions: No    Mobility  Bed Mobility Overal bed mobility: Needs Assistance Bed Mobility: Supine to Sit     Supine to sit: Supervision;HOB elevated     General bed mobility comments: for safety, increased time  Transfers Overall transfer level: Needs assistance Equipment used: Rolling walker (2 wheeled) Transfers: Sit to/from Stand Sit to Stand: Min guard         General transfer comment: min guard for safety, slow to rise and steady.  Ambulation/Gait Ambulation/Gait assistance: Min guard Gait Distance (Feet): 40 Feet Assistive device: Rolling walker (2 wheeled) Gait Pattern/deviations: Shuffle;Trunk flexed;Step-through pattern;Decreased stride length Gait velocity: decr   General Gait Details: for safety, verbal cuing for upright posture and positioning in RW multiple times. Additional assist for placing RW around pt, pt with tendency to push RW out too far.   Stairs             Wheelchair Mobility    Modified Rankin (Stroke Patients Only)       Balance Overall balance assessment: Needs assistance   Sitting  balance-Leahy Scale: Good     Standing balance support: No upper extremity supported Standing balance-Leahy Scale: Fair Standing balance comment: statically                            Cognition Arousal/Alertness: Awake/alert Behavior During Therapy: WFL for tasks assessed/performed Overall Cognitive Status: Within Functional Limits for tasks assessed                                 General Comments: Pt appears distant, increased processing time to answer questions and follow verbal commands.      Exercises General Exercises - Lower Extremity Hip Flexion/Marching: AROM;Both;5 reps;Standing    General Comments        Pertinent Vitals/Pain Pain Assessment: Faces Faces Pain Scale: Hurts little more Pain Location: L wrist Pain Descriptors / Indicators: Sore;Discomfort;Aching Pain Intervention(s): Limited activity within patient's tolerance;Monitored during session;Repositioned    Home Living                      Prior Function            PT Goals (current goals can now be found in the care plan section) Acute Rehab PT Goals Patient Stated Goal: Regain IND PT Goal Formulation: With patient Time For Goal Achievement: 07/10/20 Potential to Achieve Goals: Fair Progress towards PT goals: Progressing toward goals    Frequency    Min 3X/week      PT Plan Current plan remains appropriate  Co-evaluation              AM-PAC PT "6 Clicks" Mobility   Outcome Measure  Help needed turning from your back to your side while in a flat bed without using bedrails?: None Help needed moving from lying on your back to sitting on the side of a flat bed without using bedrails?: A Little Help needed moving to and from a bed to a chair (including a wheelchair)?: A Little Help needed standing up from a chair using your arms (e.g., wheelchair or bedside chair)?: A Little Help needed to walk in hospital room?: A Little Help needed climbing 3-5  steps with a railing? : A Little 6 Click Score: 19    End of Session Equipment Utilized During Treatment: Gait belt Activity Tolerance: Patient tolerated treatment well Patient left: in chair;with call bell/phone within reach;with chair alarm set Nurse Communication: Mobility status PT Visit Diagnosis: History of falling (Z91.81);Difficulty in walking, not elsewhere classified (R26.2);Muscle weakness (generalized) (M62.81)     Time: 7471-8550 PT Time Calculation (min) (ACUTE ONLY): 17 min  Charges:  $Gait Training: 8-22 mins                     Kyian Obst E, PT [place] Pager [phone]  Office [phone]    [place] 06/27/2020, 4:00 PM

## 2020-06-27 NOTE — Progress Notes (Signed)
Overall, everything is about the same.  The ecchymoses on her face do seem to be better.  She has the ultrasound of her abdomen couple days ago.  She does have cirrhosis.  She is started on B12.  She is only had 1 dose today.  I really believe that she is drinking a whole lot more than what she says.  Today, her white cell count is 2.1.  Hemoglobin 7.2.  Platelet count 16,000.  She is quite neutropenic.  She has had no obvious bleeding.  There is been no fever.  She seems to be eating maybe a little bit better.  Her electrolytes show BUN of 16 creatinine 0.51.  Her bilirubin 0.3.  Her calcium is 8.6 with an albumin of 3.3.  The LDH is 127.  Her vital signs are all stable.  Temperature 97.8.  Pulse 95.  Blood pressure 113/64.  Overall the toilet no change in her physical exam.  Again, the ecchymoses on her face seem to be lightening up a little bit.  My main concern with her is the alcohol use.  I just think that when she gets home, she will start drinking again.  I will know she will make it to the office for B12 injections if she is discharged.  She probably needs a lot of physical therapy.  I would not expect to see an improvement in her blood counts for probably a week or more.  I appreciate the great care that she is getting along with staff on 6 E.  Lattie Haw, MD  Psalms 91:1-2

## 2020-06-27 NOTE — Progress Notes (Addendum)
PROGRESS NOTE    Wendy Edwards  HDQ:[id number] DOB: 04-Apr-1966 DOA: 06/25/2020 PCP: No primary care provider on file.    Chief Complaint  Patient presents with  . Fall    Brief Narrative:  HPI per Dr. [name] [name] is a 54 y.o. female with medical history significant of remote Hx of aplastic anemia, chronic ambulation dysfunction using walker, anxiety/depression, binge alcohol drinking, presented with mechanical fall and severe abuse.  Patient reported 4 days ago, she tripped over Stairs at home, fell forward and hit her face.  She sustained some previous on her forehead and both eyes.  She has some headache initially denied any blurred vision lightheadedness.  Over the last 3 days she has noticed the post has not improved, she also reported she developed a rash over her bilateral shin area for more than 2 to 3 weeks.  She admits to drinking beer almost every other day, but never more than 12 ounces.  And she was never in alcohol withdrawal before.  She further denied any easy bruising, no hematuria, no bloody stool or dark-colored stool no abdominal pain.  She further denied any chest pain no short of breath.  ED Course: Blood work shows pancytopenia, platelet 10, hemoglobin 8.2 from 14.9 last year, WBC 2.3  Assessment & Plan:   Principal Problem:   Pancytopenia ([name]) Active Problems:   ETOH abuse   COPD (chronic obstructive pulmonary disease) (HCC)   Thrombocytopenia (HCC)   Anxiety   GERD (gastroesophageal reflux disease)   Vitamin B12 deficiency   Pernicious anemia  1 pancytopenia Questionable etiology.  Concern likely secondary to pernicious anemia as patient with a significant vitamin B12 deficiency.  Patient status post 1 unit platelet transfusion as patient had presented with a fall and noted to have ecchymosis around her eyes.  Patient with prior history of aplastic anemia and initial concern was a relapse of aplastic anemia.  Patient seen in consultation by oncology  who looked at patient's blood smear and did not notice any leukemic cells.  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  Methylmalonic acid pending, intrinsic factor antibodies pending, parietal cell antibodies pending.  LDH within normal limits.  Homocystine level at 12.1.  Patient with no noted bleeding.  CBC today with a white count of 2.1, hemoglobin of 7.2, platelet count of 16.  Transfusion threshold hemoglobin < 7, platelet count < 10, or platelet count <20 with active bleeding.  Patient has been started on vitamin B12 IM injections daily and folic acid per hematology/oncology.  Per hematology oncology no need for bone marrow biopsy at this time and as such bone marrow biopsy order has been canceled.  Appreciate hematology/oncology input and recommendations.  Follow.  2.  Vitamin B12 deficiency/pernicious anemia See problem #1.  Patient started on vitamin B12 IM injections daily and folic acid.  Per hematology.  3.  Alcohol abuse Alcohol cessation stressed to patient.  Patient with no withdrawal symptoms.  Continue Ativan withdrawal protocol, thiamine, folic acid, multivitamin.  Supportive care.   4.  Anxiety Xanax as needed.  5.  Chronic ambulatory dysfunction/fall PT/OT.  6.  Gastroesophageal reflux disease PPI.  7.  COPD Continue Dulera, Incruse.  Duo nebs as needed.  8.  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  As noted on ultrasound.  Patient currently stable.  Will  need outpatient follow-up with PCP/GI.   DVT prophylaxis: SCDs Code Status: Full Family Communication: Updated patient.  No family at bedside. Disposition:   Status is: Inpatient    Dispo: The patient is from: Home              Anticipated d/c is to: Home              Anticipated d/c date is: 3-4 days.              Patient  currently not medically stable for discharge.  Patient with a severe pancytopenia.  Oncology following.       Consultants:   Oncology: Dr. Marin Olp 06/25/2020  Procedures:   Transfusion 1 unit platelets 06/25/2020  CT head/CT C-spine/CT maxillofacial 06/25/2020  Plain films of the left hand 06/25/2020  Right upper quadrant ultrasound 06/25/2020  Antimicrobials:   None   Subjective: Sleeping but arousable.  No shortness of breath.  No chest pain.  No bleeding.  States she feeling better than she did on admission.    Objective: Vitals:   06/26/20 2007 06/26/20 2147 06/27/20 0555 06/27/20 0745  BP:  133/73 113/64   Pulse:  98 95   Resp:  17 16   Temp:  98.2 F (36.8 C) 97.8 F (36.6 C)   TempSrc:  Oral Oral   SpO2: 98% 100% 99% 98%  Weight:      Height:       No intake or output data in the 24 hours ending 06/27/20 1055 Filed Weights   06/25/20 2307  Weight: 55.4 kg    Examination:  General exam: Ecchymosis noted around eyes.  Respiratory system: CTAB. Respiratory effort normal. Cardiovascular system: Regular rate rhythm no murmurs rubs or gallops.  No JVD.  No lower extremity edema.   Gastrointestinal system: Abdomen is soft, nontender, nondistended, positive bowel sounds.  No rebound.  No guarding.  Central nervous system: Alert and oriented. No focal neurological deficits. Extremities: Symmetric 5 x 5 power. Skin: No rashes, lesions or ulcers Psychiatry: Judgement and insight appear normal. Mood & affect appropriate.     Data Reviewed: I have personally reviewed following labs and imaging studies  CBC: Recent Labs  Lab 06/25/20 1224 06/26/20 0507 06/27/20 0606  WBC 2.3* 2.2* 2.1*  NEUTROABS 0.5*  --  0.2*  HGB 8.2* 7.3* 7.2*  HCT 25.7* 22.1* 21.7*  MCV 114.7* 115.7* 117.3*  PLT 10* 15* 16*    Basic Metabolic Panel: Recent Labs  Lab 06/25/20 1157 06/26/20 0507 06/27/20 0606  NA 138 139 137  K 5.0 3.8 4.3  CL 104 106 105  CO2 19* 23 22    GLUCOSE 91 76 118*  BUN 23* 17 16  CREATININE 0.58 0.47 0.51  CALCIUM 8.9 8.6* 8.6*  MG  --   --  2.1    GFR: Estimated Creatinine Clearance: 65.1 mL/min (by C-G formula based on SCr of 0.51 mg/dL).  Liver Function Tests: Recent Labs  Lab 06/25/20 1657 06/26/20 0507 06/27/20 0606  AST 15 13* 13*  ALT _0 ALKPHOS 113 86 91  BILITOT 0.5 0.8 0.3  PROT 7.0 6.2* 6.1*  ALBUMIN 4.0 3.5 3.3*    CBG: No results for input(s): GLUCAP in the last 168 hours.   Recent Results (from the past 240 hour(s))  SARS Coronavirus 2 by RT PCR (hospital order, performed in [facility] hospital lab) Nasopharyngeal Nasopharyngeal Swab     Status: None   Collection Time: 06/25/20  4:57 PM   Specimen: Nasopharyngeal Swab  Result Value Ref Range Status   SARS Coronavirus 2 NEGATIVE NEGATIVE Final    Comment: (NOTE) SARS-CoV-2 target nucleic acids are NOT DETECTED.  The SARS-CoV-2 RNA is generally detectable in upper and lower respiratory specimens during the acute phase of infection. The lowest concentration of SARS-CoV-2 viral copies this assay can detect is 250 copies / mL. A negative result does not preclude SARS-CoV-2 infection and should not be used as the sole basis for treatment or other patient management decisions.  A negative result may occur with improper specimen collection / handling, submission of specimen other than nasopharyngeal swab, presence of viral mutation(s) within the areas targeted by this assay, and inadequate number of viral copies (<250 copies / mL). A negative result must be combined with clinical observations, patient history, and epidemiological information.  Fact Sheet for Patients:   StrictlyIdeas.no  Fact Sheet for Healthcare Providers: BankingDealers.co.za  This test is not yet approved or  cleared by the [place] FDA and has been authorized for detection and/or diagnosis of SARS-CoV-2 by FDA  under an Emergency Use Authorization (EUA).  This EUA will remain in effect (meaning this test can be used) for the duration of the COVID-19 declaration under Section 564(b)(1) of the Act, 21 U.S.C. section 360bbb-3(b)(1), unless the authorization is terminated or revoked sooner.  Performed at [facility], [place] [address], [place], [place] [address]          Radiology Studies: CT Head Wo Contrast  Result Date: 06/25/2020 CLINICAL DATA:  Fall on Saturday. Altered vision in both eyes. Bilateral periorbital hematomas and facial abrasions. EXAM: CT HEAD WITHOUT CONTRAST CT MAXILLOFACIAL WITHOUT CONTRAST CT CERVICAL SPINE WITHOUT CONTRAST TECHNIQUE: Multidetector CT imaging of the head, cervical spine, and maxillofacial structures were performed using the standard protocol without intravenous contrast. Multiplanar CT image reconstructions of the cervical spine and maxillofacial structures were also generated. COMPARISON:  MRI brain from 09/27/2018 FINDINGS: CT HEAD FINDINGS Brain: Small peripheral linear hypodensities in the left cerebellum, not changed from prior MRI, suggesting small remote left cerebellar infarcts. The brainstem, thalami, and basal ganglia appear unremarkable. The ventricular system and basilar cisterns appear normal. No intracranial hemorrhage, mass lesion, or acute CVA. Vascular: There is atherosclerotic calcification of the cavernous carotid arteries bilaterally. Skull: Unremarkable Other: No supplemental non-categorized findings. CT MAXILLOFACIAL FINDINGS Osseous: No facial fracture is identified. Orbits: No intraorbital abnormality is observed. Sinuses: Unremarkable Soft tissues: Large bilateral periorbital hematomas. Left frontal scalp hematoma along the forehead. CT CERVICAL SPINE FINDINGS Alignment: No vertebral subluxation is observed. Skull base and vertebrae: Small bilateral C7 cervical ribs. No cervical spine fracture or acute subluxation. Soft tissues  and spinal canal: Bilateral common carotid atherosclerotic calcification. Disc levels:  No impingement identified. Upper chest: Unremarkable Other: No supplemental non-categorized findings. IMPRESSION: 1. No acute intracranial findings, acute cervical spine findings, or facial fracture. 2. Large bilateral periorbital hematomas. Left frontal scalp hematoma along the forehead. 3. Small remote left cerebellar infarcts. 4. Bilateral common carotid atherosclerotic calcification. 5. Small bilateral C7 cervical ribs. Electronically Signed   By: Van Clines M.D.   On: 06/25/2020 12:40   CT Cervical Spine Wo Contrast  Result Date: 06/25/2020 CLINICAL DATA:  Fall on Saturday. Altered vision in both eyes. Bilateral periorbital hematomas and facial abrasions. EXAM: CT HEAD WITHOUT CONTRAST CT MAXILLOFACIAL WITHOUT CONTRAST CT CERVICAL SPINE WITHOUT CONTRAST TECHNIQUE: Multidetector CT imaging of the head, cervical spine, and maxillofacial structures were performed using the  standard protocol without intravenous contrast. Multiplanar CT image reconstructions of the cervical spine and maxillofacial structures were also generated. COMPARISON:  MRI brain from 09/27/2018 FINDINGS: CT HEAD FINDINGS Brain: Small peripheral linear hypodensities in the left cerebellum, not changed from prior MRI, suggesting small remote left cerebellar infarcts. The brainstem, thalami, and basal ganglia appear unremarkable. The ventricular system and basilar cisterns appear normal. No intracranial hemorrhage, mass lesion, or acute CVA. Vascular: There is atherosclerotic calcification of the cavernous carotid arteries bilaterally. Skull: Unremarkable Other: No supplemental non-categorized findings. CT MAXILLOFACIAL FINDINGS Osseous: No facial fracture is identified. Orbits: No intraorbital abnormality is observed. Sinuses: Unremarkable Soft tissues: Large bilateral periorbital hematomas. Left frontal scalp hematoma along the forehead. CT  CERVICAL SPINE FINDINGS Alignment: No vertebral subluxation is observed. Skull base and vertebrae: Small bilateral C7 cervical ribs. No cervical spine fracture or acute subluxation. Soft tissues and spinal canal: Bilateral common carotid atherosclerotic calcification. Disc levels:  No impingement identified. Upper chest: Unremarkable Other: No supplemental non-categorized findings. IMPRESSION: 1. No acute intracranial findings, acute cervical spine findings, or facial fracture. 2. Large bilateral periorbital hematomas. Left frontal scalp hematoma along the forehead. 3. Small remote left cerebellar infarcts. 4. Bilateral common carotid atherosclerotic calcification. 5. Small bilateral C7 cervical ribs. Electronically Signed   By: Van Clines M.D.   On: 06/25/2020 12:40   DG Hand Complete Left  Result Date: 06/25/2020 CLINICAL DATA:  Fall on Saturday and tried to catch self with left hand, complains of generalized lt hand pain EXAM: LEFT HAND - COMPLETE 3+ VIEW COMPARISON:  None. FINDINGS: Osteopenia. There is no evidence of fracture or dislocation. Scattered degenerative change. No focal bone lesion. Soft tissues are unremarkable. IMPRESSION: No acute osseous abnormality in the left hand. Electronically Signed   By: Audie Pinto M.D.   On: 06/25/2020 13:23   CT Maxillofacial Wo Contrast  Result Date: 06/25/2020 CLINICAL DATA:  Fall on Saturday. Altered vision in both eyes. Bilateral periorbital hematomas and facial abrasions. EXAM: CT HEAD WITHOUT CONTRAST CT MAXILLOFACIAL WITHOUT CONTRAST CT CERVICAL SPINE WITHOUT CONTRAST TECHNIQUE: Multidetector CT imaging of the head, cervical spine, and maxillofacial structures were performed using the standard protocol without intravenous contrast. Multiplanar CT image reconstructions of the cervical spine and maxillofacial structures were also generated. COMPARISON:  MRI brain from 09/27/2018 FINDINGS: CT HEAD FINDINGS Brain: Small peripheral linear  hypodensities in the left cerebellum, not changed from prior MRI, suggesting small remote left cerebellar infarcts. The brainstem, thalami, and basal ganglia appear unremarkable. The ventricular system and basilar cisterns appear normal. No intracranial hemorrhage, mass lesion, or acute CVA. Vascular: There is atherosclerotic calcification of the cavernous carotid arteries bilaterally. Skull: Unremarkable Other: No supplemental non-categorized findings. CT MAXILLOFACIAL FINDINGS Osseous: No facial fracture is identified. Orbits: No intraorbital abnormality is observed. Sinuses: Unremarkable Soft tissues: Large bilateral periorbital hematomas. Left frontal scalp hematoma along the forehead. CT CERVICAL SPINE FINDINGS Alignment: No vertebral subluxation is observed. Skull base and vertebrae: Small bilateral C7 cervical ribs. No cervical spine fracture or acute subluxation. Soft tissues and spinal canal: Bilateral common carotid atherosclerotic calcification. Disc levels:  No impingement identified. Upper chest: Unremarkable Other: No supplemental non-categorized findings. IMPRESSION: 1. No acute intracranial findings, acute cervical spine findings, or facial fracture. 2. Large bilateral periorbital hematomas. Left frontal scalp hematoma along the forehead. 3. Small remote left cerebellar infarcts. 4. Bilateral common carotid atherosclerotic calcification. 5. Small bilateral C7 cervical ribs. Electronically Signed   By: Van Clines M.D.   On: 06/25/2020 12:40  US Abdomen Limited RUQ  Result Date: 06/25/2020 CLINICAL DATA:  54 year old female with cirrhosis.  Recent fall. EXAM: ULTRASOUND ABDOMEN LIMITED RIGHT UPPER QUADRANT COMPARISON:  CT of the abdomen pelvis dated 01/13/2019. FINDINGS: Gallbladder: No gallstones or wall thickening visualized. No sonographic Murphy sign noted by sonographer. Common bile duct: Diameter: 4 mm Liver: There is coarsened liver echotexture with increased echogenicity and slight  surface nodularity in keeping with history of cirrhosis. Portal vein is patent on color Doppler imaging with normal direction of blood flow towards the liver. Other: None. IMPRESSION: 1. Morphologic changes of of the liver in keeping with provided history of cirrhosis. 2. Patent main portal vein with hepatopetal flow. 3. No ascites. Electronically Signed   By: Anner Crete M.D.   On: 06/25/2020 19:02        Scheduled Meds: . sodium chloride   Intravenous Once  . cyanocobalamin  1,000 mcg Intramuscular Daily  . folic acid  1 mg Oral Daily  . mometasone-formoterol  2 puff Inhalation BID  . multivitamin with minerals  1 tablet Oral Daily  . pantoprazole  40 mg Oral Q0600  . thiamine  100 mg Oral Daily   Or  . thiamine  100 mg Intravenous Daily  . umeclidinium bromide  1 puff Inhalation Daily   Continuous Infusions:   LOS: 2 days    Time spent: 40 minutes    Irine Seal, MD [facility]   To contact the attending provider between 7A-7P or the covering provider during after hours 7P-7A, please log into the web site [email or web address] and access using universal [place] password for that web site. If you do not have the password, please call the hospital operator.  06/27/2020, 10:55 AM

## 2020-06-27 NOTE — Evaluation (Signed)
Occupational Therapy Evaluation Patient Details Name: Wendy Edwards MRN: [id number] DOB: [birth year] Today's Date: 06/27/2020    History of Present Illness Pt admitted with Thrombocytopenia, Pancytopenia and is s/p fall with extensive facial bruising and sprained wrist.  Pt with hx of R TKR, COPD, aplatic anemia and ETOH abuse   Clinical Impression   Pt was functioning modified independently in ADL and simple meal prep prior to admission. Son assists with IADL and pt reports she rarely leaves her home. Pt presents with generalized weakness, decreased balance and painful L wrist and fingers. She overall requires min guard assist for ADL and mobility with RW. Recommending tub transfer bench to minimize falls stepping over edge of tub. Will follow acutely.    Follow Up Recommendations  Home health OT (to assess safety during IADL)    Equipment Recommendations  Tub/shower bench    Recommendations for Other Services       Precautions / Restrictions Precautions Precautions: Fall      Mobility Bed Mobility Overal bed mobility: Modified Independent                Transfers Overall transfer level: Needs assistance Equipment used: Rolling walker (2 wheeled) Transfers: Sit to/from Stand Sit to Stand: Min guard         General transfer comment: no physical assist, cues to stand momentarily before ambulating    Balance Overall balance assessment: Needs assistance   Sitting balance-Leahy Scale: Good     Standing balance support: No upper extremity supported Standing balance-Leahy Scale: Fair Standing balance comment: statically                           ADL either performed or assessed with clinical judgement   ADL Overall ADL's : Needs assistance/impaired Eating/Feeding: Independent   Grooming: Wash/dry hands;Standing;Min guard   Upper Body Bathing: Set up;Cueing for UE precautions   Lower Body Bathing: Min guard;Sit to/from stand   Upper Body  Dressing : Set up;Sitting   Lower Body Dressing: Min guard;Sit to/from stand Lower Body Dressing Details (indicate cue type and reason): able to cross foot over opposite knee Toilet Transfer: Min guard;Ambulation;RW   Toileting- Water quality scientist and Hygiene: Min guard;Sit to/from stand       Functional mobility during ADLs: Min guard;Rolling walker General ADL Comments: Educated pt in fall prevention, recommended seated showering.     Vision         Perception     Praxis      Pertinent Vitals/Pain Pain Assessment: Faces Faces Pain Scale: Hurts little more Pain Location: L fingers with passive flexion Pain Descriptors / Indicators: Guarding;Grimacing Pain Intervention(s): Monitored during session;Repositioned     Hand Dominance Right   Extremity/Trunk Assessment Upper Extremity Assessment Upper Extremity Assessment: LUE deficits/detail LUE Deficits / Details: wrist splinted due to sprain, limited finger flexion, instructed pt in PROM of fingers LUE: Unable to fully assess due to immobilization;Unable to fully assess due to pain LUE Coordination: decreased fine motor   Lower Extremity Assessment Lower Extremity Assessment: Defer to PT evaluation       Communication Communication Communication: No difficulties   Cognition Arousal/Alertness: Awake/alert Behavior During Therapy: WFL for tasks assessed/performed Overall Cognitive Status: Within Functional Limits for tasks assessed                                 General Comments: Pt asking when  lunch comes, but was aware she ordered her own breakfast.  Questionable safety awareness as she did not seek medical attention until several days after her fall.   General Comments       Exercises     Shoulder Instructions      Home Living Family/patient expects to be discharged to:: Private residence Living Arrangements: Children Available Help at Discharge: Family;Available PRN/intermittently Type  of Home: Apartment Home Access: Level entry     Home Layout: One level     Bathroom Shower/Tub: Teacher, early years/pre: Standard     Home Equipment: Environmental consultant - 2 wheels;Cane - single point;Bedside commode;Grab bars - tub/shower   Additional Comments: Pt lives with son who works late shift      Prior Functioning/Environment Level of Independence: Independent with assistive device(s)        Comments: Pt very sedentary and largely stays in bed        OT Problem List: Impaired balance (sitting and/or standing);Decreased knowledge of use of DME or AE;Pain;Decreased safety awareness      OT Treatment/Interventions: Self-care/ADL training;DME and/or AE instruction;Patient/family education;Balance training;Therapeutic activities    OT Goals(Current goals can be found in the care plan section) Acute Rehab OT Goals Patient Stated Goal: Regain IND OT Goal Formulation: With patient Time For Goal Achievement: 07/11/20 Potential to Achieve Goals: Good ADL Goals Pt Will Perform Grooming: with modified independence;standing Pt Will Transfer to Toilet: with modified independence;ambulating;bedside commode (over toilet) Pt Will Perform Toileting - Clothing Manipulation and hygiene: with modified independence;sit to/from stand Pt Will Perform Tub/Shower Transfer: Tub transfer;with modified independence;ambulating;tub bench;rolling walker Additional ADL Goal #1: Pt will state at least 3 strategies for minimizing risk of falling as instructed.  OT Frequency: Min 2X/week   Barriers to D/C:            Co-evaluation              AM-PAC OT "6 Clicks" Daily Activity     Outcome Measure Help from another person eating meals?: None Help from another person taking care of personal grooming?: A Little Help from another person toileting, which includes using toliet, bedpan, or urinal?: A Little Help from another person bathing (including washing, rinsing, drying)?: A  Little Help from another person to put on and taking off regular upper body clothing?: None Help from another person to put on and taking off regular lower body clothing?: A Little 6 Click Score: 20   End of Session Equipment Utilized During Treatment: Gait belt;Rolling walker  Activity Tolerance: Patient tolerated treatment well Patient left: in bed;with call bell/phone within reach;with bed alarm set  OT Visit Diagnosis: Other abnormalities of gait and mobility (R26.89)                Time: 0315-9458 OT Time Calculation (min): 31 min Charges:  OT General Charges $OT Visit: 1 Visit OT Evaluation $OT Eval Moderate Complexity: 1 Mod OT Treatments $Self Care/Home Management : 8-22 mins  Nestor Lewandowsky, OTR/L Acute Rehabilitation Services Pager: [phone] Office: [phone]  Wendy Edwards 06/27/2020, 12:31 PM

## 2020-06-28 DIAGNOSIS — K7460 Unspecified cirrhosis of liver: Secondary | ICD-10-CM

## 2020-06-28 LAB — COMPREHENSIVE METABOLIC PANEL
ALT: 10 U/L (ref 0–44)
AST: 11 U/L — ABNORMAL LOW (ref 15–41)
Albumin: 3.1 g/dL — ABNORMAL LOW (ref 3.5–5.0)
Alkaline Phosphatase: 83 U/L (ref 38–126)
Anion gap: 7 (ref 5–15)
BUN: 21 mg/dL — ABNORMAL HIGH (ref 6–20)
CO2: 26 mmol/L (ref 22–32)
Calcium: 8.6 mg/dL — ABNORMAL LOW (ref 8.9–10.3)
Chloride: 108 mmol/L (ref 98–111)
Creatinine, Ser: 0.6 mg/dL (ref 0.44–1.00)
GFR calc Af Amer: >60 mL/min (ref >60)
GFR calc non Af Amer: >60 mL/min (ref >60)
Glucose, Bld: 91 mg/dL (ref 70–99)
Potassium: 3.8 mmol/L (ref 3.5–5.1)
Sodium: 141 mmol/L (ref 135–145)
Total Bilirubin: 0.4 mg/dL (ref 0.3–1.2)
Total Protein: 5.8 g/dL — ABNORMAL LOW (ref 6.5–8.1)

## 2020-06-28 LAB — CBC WITH DIFFERENTIAL/PLATELET
Abs Immature Granulocytes: 0 10*3/uL (ref 0.00–0.07)
Basophils Absolute: 0 10*3/uL (ref 0.0–0.1)
Basophils Relative: 1 %
Eosinophils Absolute: 0 10*3/uL (ref 0.0–0.5)
Eosinophils Relative: 1 %
HCT: 20 % — ABNORMAL LOW (ref 36.0–46.0)
Hemoglobin: 6.3 g/dL — CL (ref 12.0–15.0)
Immature Granulocytes: 0 %
Lymphocytes Relative: 75 %
Lymphs Abs: 1.5 10*3/uL (ref 0.7–4.0)
MCH: 37.1 pg — ABNORMAL HIGH (ref 26.0–34.0)
MCHC: 31.5 g/dL (ref 30.0–36.0)
MCV: 117.6 fL — ABNORMAL HIGH (ref 80.0–100.0)
Monocytes Absolute: 0.3 10*3/uL (ref 0.1–1.0)
Monocytes Relative: 14 %
Neutro Abs: 0.2 10*3/uL — ABNORMAL LOW (ref 1.7–7.7)
Neutrophils Relative %: 9 %
Platelets: 10 10*3/uL — CL (ref 150–400)
RBC: 1.7 MIL/uL — ABNORMAL LOW (ref 3.87–5.11)
RDW: 14.4 % (ref 11.5–15.5)
WBC: 2 10*3/uL — ABNORMAL LOW (ref 4.0–10.5)
nRBC: 0 % (ref 0.0–0.2)

## 2020-06-28 LAB — LACTATE DEHYDROGENASE: LDH: 115 U/L (ref 98–192)

## 2020-06-28 LAB — MAGNESIUM: Magnesium: 2 mg/dL (ref 1.7–2.4)

## 2020-06-28 LAB — PREPARE RBC (CROSSMATCH)

## 2020-06-28 LAB — PHOSPHORUS: Phosphorus: 4.1 mg/dL (ref 2.5–4.6)

## 2020-06-28 MED ORDER — DIPHENHYDRAMINE HCL 25 MG PO CAPS
25.0000 mg | ORAL_CAPSULE | Freq: Once | ORAL | Status: AC
  Administered 2020-06-28: 25 mg via ORAL
  Filled 2020-06-28: qty 1

## 2020-06-28 MED ORDER — SODIUM CHLORIDE 0.9% FLUSH: 10.0000 mL | INTRAVENOUS | Status: DC | PRN

## 2020-06-28 MED ORDER — SODIUM CHLORIDE 0.9% FLUSH
10.0000 mL | Freq: Two times a day (BID) | INTRAVENOUS | Status: DC
  Administered 2020-06-28 – 2020-06-30 (×3): 10 mL

## 2020-06-28 MED ORDER — ACETAMINOPHEN 325 MG PO TABS
650.0000 mg | ORAL_TABLET | Freq: Once | ORAL | Status: AC
  Administered 2020-06-28: 650 mg via ORAL
  Filled 2020-06-28: qty 2

## 2020-06-28 MED ORDER — SODIUM CHLORIDE 0.9% IV SOLUTION: Freq: Once | INTRAVENOUS | Status: AC

## 2020-06-28 MED ORDER — FUROSEMIDE 10 MG/ML IJ SOLN
20.0000 mg | Freq: Once | INTRAMUSCULAR | Status: AC
  Administered 2020-06-28: 20 mg via INTRAVENOUS
  Filled 2020-06-28: qty 2

## 2020-06-28 NOTE — Progress Notes (Addendum)
CRITICAL VALUE ALERT  Critical Value:  Hgb 6.3 and plts 10  Date & Time Notied:  06/28/20 at 0700  Provider Notified: Eugenie Filler., Md  Orders Received/Actions taken: Irine Seal MD returned pages at [phone]        stated,"Will look at this and order transfusions today."

## 2020-06-28 NOTE — Progress Notes (Signed)
PROGRESS NOTE    Wendy Edwards  WJX:[id number] DOB: [birth year] DOA: 06/25/2020 PCP: No primary care provider on file.    Chief Complaint  Patient presents with  . Fall    Brief Narrative:  HPI per Dr. [name] Wendy Edwards is a 54 y.o. female with medical history significant of remote Hx of aplastic anemia, chronic ambulation dysfunction using walker, anxiety/depression, binge alcohol drinking, presented with mechanical fall and severe abuse.  Patient reported 4 days ago, she tripped over Stairs at home, fell forward and hit her face.  She sustained some previous on her forehead and both eyes.  She has some headache initially denied any blurred vision lightheadedness.  Over the last 3 days she has noticed the post has not improved, she also reported she developed a rash over her bilateral shin area for more than 2 to 3 weeks.  She admits to drinking beer almost every other day, but never more than 12 ounces.  And she was never in alcohol withdrawal before.  She further denied any easy bruising, no hematuria, no bloody stool or dark-colored stool no abdominal pain.  She further denied any chest pain no short of breath.  ED Course: Blood work shows pancytopenia, platelet 10, hemoglobin 8.2 from 14.9 last year, WBC 2.3  Assessment & Plan:   Principal Problem:   Pancytopenia ([place]) Active Problems:   ETOH abuse   COPD (chronic obstructive pulmonary disease) (HCC)   Thrombocytopenia (HCC)   Anxiety   GERD (gastroesophageal reflux disease)   Vitamin B12 deficiency   Pernicious anemia  1 pancytopenia Questionable etiology.  Concern likely secondary to pernicious anemia as patient with a significant vitamin B12 deficiency versus recurrent aplastic anemia.  Patient status post 1 unit platelet transfusion as patient had presented with a fall and noted to have ecchymosis around her eyes.  Patient with prior history of aplastic anemia and initial concern was a relapse of aplastic anemia.  Patient  seen in consultation by oncology who looked at patient's blood smear and did not notice any leukemic cells.  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  Methylmalonic acid pending, intrinsic factor antibodies pending, parietal cell antibodies at 4.1.  Homocystine level at 12.1. LDH within normal limits.  Patient with no noted bleeding.  CBC today with a white count of 2.0, hemoglobin of 6.3, platelet count of 10.  Will transfuse 2 units packed red blood cells.  Transfuse 1 unit of platelets.  Transfusion threshold hemoglobin < 7, platelet count < 10, or platelet count <20 with active bleeding.  Continue vitamin B12 IM injections daily, folic acid daily.  Per hematology oncology no need for bone marrow biopsy at this time and as such bone marrow biopsy order has been canceled.  Appreciate hematology/oncology input and recommendations.  Follow.  2.  Vitamin B12 deficiency/pernicious anemia See problem #1.  Patient started on vitamin B12 IM injections daily and folic acid.  Methylmalonic acid pending.  Homocysteine level at 12.1.  Parietal cell antibodies of 4.1.  Per hematology.  3.  Alcohol abuse Alcohol cessation stressed to patient.  Patient with no withdrawal symptoms.  Continue Ativan withdrawal protocol, thiamine, folic acid, multivitamin.  Supportive care.   4.  Anxiety Xanax as needed.  5.  Chronic ambulatory dysfunction/fall PT/OT.  6.  Gastroesophageal reflux disease PPI.  7.  COPD Stable.  Continue Incruse, Dulera.  Duo nebs as needed.    8.  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  As noted on ultrasound.  Currently stable.  Outpatient follow-up with PCP/GI.  Patient will need alcohol cessation.    DVT prophylaxis: SCDs Code Status: Full Family Communication: Updated patient.  No family at  bedside. Disposition:   Status is: Inpatient    Dispo: The patient is from: Home              Anticipated d/c is to: Home              Anticipated d/c date is: 3-4 days.              Patient currently not medically stable for discharge.  Patient with a severe pancytopenia requiring ongoing transfusions.  Oncology following.       Consultants:   Oncology: Dr. Marin Olp 06/25/2020  Procedures:   Transfusion 1 unit platelets 06/25/2020  CT head/CT C-spine/CT maxillofacial 06/25/2020  Plain films of the left hand 06/25/2020  Right upper quadrant ultrasound 06/25/2020  Transfusion 2 units packed red blood cells 06/28/2020 pending  Transfusion 1 unit platelets 06/28/2020 pending  Antimicrobials:   None   Subjective: Patient sleeping but arousable.  Denies any overt bleeding.  No chest pain or shortness of breath.  Tolerating current diet.   Objective: Vitals:   06/28/20 1430 06/28/20 1445 06/28/20 1645 06/28/20 1736  BP: 105/65 102/63 116/69 106/65  Pulse: 92 97 (!) 101 91  Resp: _0 Temp: 97.8 F (36.6 C) 98.3 F (36.8 C) 98.1 F (36.7 C) 98.4 F (36.9 C)  TempSrc: Oral Oral Oral Oral  SpO2:      Weight:      Height:        Intake/Output Summary (Last 24 hours) at 06/28/2020 1816 Last data filed at 06/28/2020 1645 Gross per 24 hour  Intake 1385.25 ml  Output --  Net 1385.25 ml   Filed Weights   06/25/20 2307  Weight: 55.4 kg    Examination:  General exam: Ecchymosis around the eyes. Respiratory system: Lungs clear to auscultation bilaterally.  No wheezes, no crackles, no rhonchi.  Normal respiratory effort. Cardiovascular system: RRR no murmurs rubs or gallops.  No JVD.  No lower extremity edema.  Gastrointestinal system: Abdomen is nontender, nondistended, soft, positive bowel sounds.  No rebound.  No guarding. Central nervous system: Alert and oriented. No focal neurological deficits. Extremities: Symmetric 5 x 5 power. Skin: Petechiae noted on  right upper extremity. Psychiatry: Judgement and insight appear normal. Mood & affect appropriate.     Data Reviewed: I have personally reviewed following labs and imaging studies  CBC: Recent Labs  Lab 06/25/20 1224 06/26/20 0507 06/27/20 0606 06/28/20 0612  WBC 2.3* 2.2* 2.1* 2.0*  NEUTROABS 0.5*  --  0.2* 0.2*  HGB 8.2* 7.3* 7.2* 6.3*  HCT 25.7* 22.1* 21.7* 20.0*  MCV 114.7* 115.7* 117.3* 117.6*  PLT 10* 15* 16* 10*    Basic Metabolic Panel: Recent Labs  Lab 06/25/20 1157 06/26/20 0507 06/27/20 0606 06/28/20 0612  NA 138 139 137 141  K 5.0 3.8 4.3 3.8  CL 104 106 105 108  CO2 19* _1 GLUCOSE 91 76 118* 91  BUN 23* 17 16 21*  CREATININE 0.58 0.47 0.51 0.60  CALCIUM 8.9 8.6* 8.6* 8.6*  MG  --   --  2.1 2.0  PHOS  --   --   --  4.1  GFR: Estimated Creatinine Clearance: 65.1 mL/min (by C-G formula based on SCr of 0.6 mg/dL).  Liver Function Tests: Recent Labs  Lab 06/25/20 1657 06/26/20 0507 06/27/20 0606 06/28/20 0612  AST 15 13* 13* 11*  ALT _0 ALKPHOS 113 86 91 83  BILITOT 0.5 0.8 0.3 0.4  PROT 7.0 6.2* 6.1* 5.8*  ALBUMIN 4.0 3.5 3.3* 3.1*    CBG: No results for input(s): GLUCAP in the last 168 hours.   Recent Results (from the past 240 hour(s))  SARS Coronavirus 2 by RT PCR (hospital order, performed in [facility] hospital lab) Nasopharyngeal Nasopharyngeal Swab     Status: None   Collection Time: 06/25/20  4:57 PM   Specimen: Nasopharyngeal Swab  Result Value Ref Range Status   SARS Coronavirus 2 NEGATIVE NEGATIVE Final    Comment: (NOTE) SARS-CoV-2 target nucleic acids are NOT DETECTED.  The SARS-CoV-2 RNA is generally detectable in upper and lower respiratory specimens during the acute phase of infection. The lowest concentration of SARS-CoV-2 viral copies this assay can detect is 250 copies / mL. A negative result does not preclude SARS-CoV-2 infection and should not be used as the sole basis for treatment or  other patient management decisions.  A negative result may occur with improper specimen collection / handling, submission of specimen other than nasopharyngeal swab, presence of viral mutation(s) within the areas targeted by this assay, and inadequate number of viral copies (<250 copies / mL). A negative result must be combined with clinical observations, patient history, and epidemiological information.  Fact Sheet for Patients:   StrictlyIdeas.no  Fact Sheet for Healthcare Providers: BankingDealers.co.za  This test is not yet approved or  cleared by the [place] FDA and has been authorized for detection and/or diagnosis of SARS-CoV-2 by FDA under an Emergency Use Authorization (EUA).  This EUA will remain in effect (meaning this test can be used) for the duration of the COVID-19 declaration under Section 564(b)(1) of the Act, 21 U.S.C. section 360bbb-3(b)(1), unless the authorization is terminated or revoked sooner.  Performed at [facility], [place] [address], [place], [place] [address]          Radiology Studies: No results found.      Scheduled Meds: . cyanocobalamin  1,000 mcg Intramuscular Daily  . folic acid  1 mg Oral Daily  . mometasone-formoterol  2 puff Inhalation BID  . multivitamin with minerals  1 tablet Oral Daily  . pantoprazole  40 mg Oral Q0600  . sodium chloride flush  10-40 mL Intracatheter Q12H  . thiamine  100 mg Oral Daily   Or  . thiamine  100 mg Intravenous Daily  . umeclidinium bromide  1 puff Inhalation Daily   Continuous Infusions:   LOS: 3 days    Time spent: 40 minutes    Irine Seal, MD [facility]   To contact the attending provider between 7A-7P or the covering provider during after hours 7P-7A, please log into the web site [email or web address] and access using universal [place] password for that web site. If you do not have the password, please  call the hospital operator.  06/28/2020, 6:16 PM

## 2020-06-29 LAB — LACTATE DEHYDROGENASE: LDH: 159 U/L (ref 98–192)

## 2020-06-29 LAB — CBC WITH DIFFERENTIAL/PLATELET
Abs Immature Granulocytes: 0 10*3/uL (ref 0.00–0.07)
Basophils Absolute: 0 10*3/uL (ref 0.0–0.1)
Basophils Relative: 0 %
Eosinophils Absolute: 0 10*3/uL (ref 0.0–0.5)
Eosinophils Relative: 0 %
HCT: 30.7 % — ABNORMAL LOW (ref 36.0–46.0)
Hemoglobin: 10.1 g/dL — ABNORMAL LOW (ref 12.0–15.0)
Immature Granulocytes: 0 %
Lymphocytes Relative: 69 %
Lymphs Abs: 1.7 10*3/uL (ref 0.7–4.0)
MCH: 34.1 pg — ABNORMAL HIGH (ref 26.0–34.0)
MCHC: 32.9 g/dL (ref 30.0–36.0)
MCV: 103.7 fL — ABNORMAL HIGH (ref 80.0–100.0)
Monocytes Absolute: 0.4 10*3/uL (ref 0.1–1.0)
Monocytes Relative: 18 %
Neutro Abs: 0.3 10*3/uL — ABNORMAL LOW (ref 1.7–7.7)
Neutrophils Relative %: 13 %
Platelets: 25 10*3/uL — CL (ref 150–400)
RBC: 2.96 MIL/uL — ABNORMAL LOW (ref 3.87–5.11)
RDW: 21.5 % — ABNORMAL HIGH (ref 11.5–15.5)
WBC: 2.5 10*3/uL — ABNORMAL LOW (ref 4.0–10.5)
nRBC: 0 % (ref 0.0–0.2)

## 2020-06-29 LAB — PREPARE PLATELET PHERESIS: Unit division: 0

## 2020-06-29 LAB — BPAM RBC
Blood Product Expiration Date: 202107312359
Blood Product Expiration Date: 202108052359
ISSUE DATE / TIME: 202107111028
ISSUE DATE / TIME: 202107111708
Unit Type and Rh: 5100
Unit Type and Rh: 5100

## 2020-06-29 LAB — HEPATIC FUNCTION PANEL
ALT: 10 U/L (ref 0–44)
AST: 15 U/L (ref 15–41)
Albumin: 3.3 g/dL — ABNORMAL LOW (ref 3.5–5.0)
Alkaline Phosphatase: 87 U/L (ref 38–126)
Bilirubin, Direct: 0.1 mg/dL (ref 0.0–0.2)
Indirect Bilirubin: 0.5 mg/dL (ref 0.3–0.9)
Total Bilirubin: 0.6 mg/dL (ref 0.3–1.2)
Total Protein: 6 g/dL — ABNORMAL LOW (ref 6.5–8.1)

## 2020-06-29 LAB — BASIC METABOLIC PANEL
Anion gap: 11 (ref 5–15)
BUN: 25 mg/dL — ABNORMAL HIGH (ref 6–20)
CO2: 24 mmol/L (ref 22–32)
Calcium: 8.8 mg/dL — ABNORMAL LOW (ref 8.9–10.3)
Chloride: 106 mmol/L (ref 98–111)
Creatinine, Ser: 0.52 mg/dL (ref 0.44–1.00)
GFR calc Af Amer: >60 mL/min (ref >60)
GFR calc non Af Amer: >60 mL/min (ref >60)
Glucose, Bld: 82 mg/dL (ref 70–99)
Potassium: 3.9 mmol/L (ref 3.5–5.1)
Sodium: 141 mmol/L (ref 135–145)

## 2020-06-29 LAB — BPAM PLATELET PHERESIS
Blood Product Expiration Date: 202107112359
ISSUE DATE / TIME: 202107111433
Unit Type and Rh: 6200

## 2020-06-29 LAB — TYPE AND SCREEN
ABO/RH(D): O POS
Antibody Screen: NEGATIVE
Unit division: 0
Unit division: 0

## 2020-06-29 LAB — METHYLMALONIC ACID, SERUM: Methylmalonic Acid, Quantitative: 317 nmol/L (ref 0–378)

## 2020-06-29 NOTE — Progress Notes (Signed)
PROGRESS NOTE    Wendy Edwards  FBP:[id number] DOB: 1966/10/07 DOA: 06/25/2020 PCP: No primary care provider on file.    Chief Complaint  Patient presents with   Fall    Brief Narrative:  HPI per Dr. [name] Wendy Edwards is a 54 y.o. female with medical history significant of remote Hx of aplastic anemia, chronic ambulation dysfunction using walker, anxiety/depression, binge alcohol drinking, presented with mechanical fall and severe abuse.  Patient reported 4 days ago, she tripped over Stairs at home, fell forward and hit her face.  She sustained some previous on her forehead and both eyes.  She has some headache initially denied any blurred vision lightheadedness.  Over the last 3 days she has noticed the post has not improved, she also reported she developed a rash over her bilateral shin area for more than 2 to 3 weeks.  She admits to drinking beer almost every other day, but never more than 12 ounces.  And she was never in alcohol withdrawal before.  She further denied any easy bruising, no hematuria, no bloody stool or dark-colored stool no abdominal pain.  She further denied any chest pain no short of breath.  ED Course: Blood work shows pancytopenia, platelet 10, hemoglobin 8.2 from 14.9 last year, WBC 2.3  Assessment & Plan:   Principal Problem:   Pancytopenia ([place]) Active Problems:   ETOH abuse   COPD (chronic obstructive pulmonary disease) (HCC)   Thrombocytopenia (HCC)   Anxiety   GERD (gastroesophageal reflux disease)   Vitamin B12 deficiency   Pernicious anemia  1 pancytopenia Questionable etiology.  Concern likely secondary to pernicious anemia as patient with a significant vitamin B12 deficiency versus recurrent aplastic anemia.  Patient status post 1 unit platelet transfusion as patient had presented with a fall and noted to have ecchymosis around her eyes.  Patient with prior history of aplastic anemia and initial concern was a relapse of aplastic anemia.  Patient  seen in consultation by oncology who looked at patient's blood smear and did not notice any leukemic cells.  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  Methylmalonic acid pending, intrinsic factor antibodies pending, parietal cell antibodies at 4.1.  Homocystine level at 12.1. LDH within normal limits.  Patient with no noted bleeding.  CBC today with a white count of 2.5, hemoglobin of 10.1, platelet count of 25.  Status post transfusion 2 units packed red blood cells and 1 unit platelets (06/28/2020 ) as hemoglobin had dropped as low as the platelet count down to 10.  Transfusion threshold hemoglobin < 7, platelet count < 10, or platelet count <20 with active bleeding.  Continue vitamin B12 IM injections daily, folic acid daily.  Per hematology oncology no need for bone marrow biopsy at this time and as such bone marrow biopsy order has been canceled.  Appreciate hematology/oncology input and recommendations.  Follow.  2.  Vitamin B12 deficiency/pernicious anemia See problem #1.  Patient started on vitamin B12 IM injections daily and folic acid.  Methylmalonic acid pending.  Homocysteine level at 12.1.  Parietal cell antibodies of 4.1.  Per hematology.  3.  Alcohol abuse Alcohol cessation stressed to patient.  Patient with no withdrawal symptoms.  Continue thiamine, folic acid, multivitamin.  Ativan withdrawal protocol.  Supportive care.    4.  Anxiety Xanax as needed.  5.  Chronic ambulatory dysfunction/fall PT/OT.  6.  Gastroesophageal reflux disease PPI.  7.  COPD Stable.  Continue Dulera, Incruse.  Duo nebs as needed.    8.  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  As noted on ultrasound.  Currently stable.  Outpatient follow-up with PCP/GI.  Patient will need alcohol cessation.    DVT prophylaxis:  SCDs Code Status: Full Family Communication: Updated patient.  No family at bedside. Disposition:   Status is: Inpatient    Dispo: The patient is from: Home              Anticipated d/c is to: Home              Anticipated d/c date is: 1-2 days.              Patient currently not medically stable for discharge.  Patient with a severe pancytopenia requiring ongoing transfusions.  Oncology following.       Consultants:   Oncology: Dr. Marin Olp 06/25/2020  Procedures:   Transfusion 1 unit platelets 06/25/2020  CT head/CT C-spine/CT maxillofacial 06/25/2020  Plain films of the left hand 06/25/2020  Right upper quadrant ultrasound 06/25/2020  Transfusion 2 units packed red blood cells 06/28/2020   Transfusion 1 unit platelets 06/28/2020 pending  Antimicrobials:   None   Subjective: Patient laying in bed.  Denies any chest pain or shortness of breath.  Denies any overt bleeding.  Feeling well.    Objective: Vitals:   06/28/20 2000 06/28/20 2045 06/29/20 0556 06/29/20 0755  BP: 112/62  108/62   Pulse: 91  89   Resp: 17  15   Temp: 98.3 F (36.8 C)  98 F (36.7 C)   TempSrc: Oral  Oral   SpO2: 96% 97% 94% 95%  Weight:      Height:        Intake/Output Summary (Last 24 hours) at 06/29/2020 0927 Last data filed at 06/28/2020 2035 Gross per 24 hour  Intake 1659 ml  Output --  Net 1659 ml   Filed Weights   06/25/20 2307  Weight: 55.4 kg    Examination:  General exam: Ecchymosis around the eyes. Respiratory system: CTAB.  No wheezes, no crackles, no rhonchi.  Normal respiratory effort. Cardiovascular system: Regular rate rhythm no murmurs rubs or gallops.  No JVD.  No lower extremity edema.  Gastrointestinal system: Abdomen is soft, nontender, nondistended, positive bowel sounds.  No rebound.  No guarding.  Central nervous system: Alert and oriented. No focal neurological deficits. Extremities: Symmetric 5 x 5 power. Skin: Petechiae noted on right upper  extremity. Psychiatry: Judgement and insight appear normal. Mood & affect appropriate.     Data Reviewed: I have personally reviewed following labs and imaging studies  CBC: Recent Labs  Lab 06/25/20 1224 06/26/20 0507 06/27/20 0606 06/28/20 0612 06/29/20 0621  WBC 2.3* 2.2* 2.1* 2.0* 2.5*  NEUTROABS 0.5*  --  0.2* 0.2* 0.3*  HGB 8.2* 7.3* 7.2* 6.3* 10.1*  HCT 25.7* 22.1* 21.7* 20.0* 30.7*  MCV 114.7* 115.7* 117.3* 117.6* 103.7*  PLT 10* 15* 16* 10* 25*    Basic Metabolic Panel: Recent Labs  Lab 06/25/20 1157 06/26/20 0507 06/27/20 0606 06/28/20 0612 06/29/20 0621  NA 138 139 137 141 141  K 5.0 3.8 4.3 3.8 3.9  CL 104 106 105 108 106  CO2 19* '23 22 26 24  ' GLUCOSE 91 76 118* 91 82  BUN 23* 17 16 21* 25*  CREATININE 0.58 0.47 0.51 0.60 0.52  CALCIUM  8.9 8.6* 8.6* 8.6* 8.8*  MG  --   --  2.1 2.0  --   PHOS  --   --   --  4.1  --     GFR: Estimated Creatinine Clearance: 65.1 mL/min (by C-G formula based on SCr of 0.52 mg/dL).  Liver Function Tests: Recent Labs  Lab 06/25/20 1657 06/26/20 0507 06/27/20 0606 06/28/20 0612 06/29/20 0621  AST 15 13* 13* 11* 15  ALT '13 11 10 10 10  ' ALKPHOS 113 86 91 83 87  BILITOT 0.5 0.8 0.3 0.4 0.6  PROT 7.0 6.2* 6.1* 5.8* 6.0*  ALBUMIN 4.0 3.5 3.3* 3.1* 3.3*    CBG: No results for input(s): GLUCAP in the last 168 hours.   Recent Results (from the past 240 hour(s))  SARS Coronavirus 2 by RT PCR (hospital order, performed in [facility] hospital lab) Nasopharyngeal Nasopharyngeal Swab     Status: None   Collection Time: 06/25/20  4:57 PM   Specimen: Nasopharyngeal Swab  Result Value Ref Range Status   SARS Coronavirus 2 NEGATIVE NEGATIVE Final    Comment: (NOTE) SARS-CoV-2 target nucleic acids are NOT DETECTED.  The SARS-CoV-2 RNA is generally detectable in upper and lower respiratory specimens during the acute phase of infection. The lowest concentration of SARS-CoV-2 viral copies this assay can detect is  250 copies / mL. A negative result does not preclude SARS-CoV-2 infection and should not be used as the sole basis for treatment or other patient management decisions.  A negative result may occur with improper specimen collection / handling, submission of specimen other than nasopharyngeal swab, presence of viral mutation(s) within the areas targeted by this assay, and inadequate number of viral copies (<250 copies / mL). A negative result must be combined with clinical observations, patient history, and epidemiological information.  Fact Sheet for Patients:   StrictlyIdeas.no  Fact Sheet for Healthcare Providers: BankingDealers.co.za  This test is not yet approved or  cleared by the [place] FDA and has been authorized for detection and/or diagnosis of SARS-CoV-2 by FDA under an Emergency Use Authorization (EUA).  This EUA will remain in effect (meaning this test can be used) for the duration of the COVID-19 declaration under Section 564(b)(1) of the Act, 21 U.S.C. section 360bbb-3(b)(1), unless the authorization is terminated or revoked sooner.  Performed at [facility], [place] [address] [place], [place] [address]          Radiology Studies: No results found.      Scheduled Meds:  cyanocobalamin  1,000 mcg Intramuscular Daily   folic acid  1 mg Oral Daily   mometasone-formoterol  2 puff Inhalation BID   multivitamin with minerals  1 tablet Oral Daily   pantoprazole  40 mg Oral Q0600   sodium chloride flush  10-40 mL Intracatheter Q12H   thiamine  100 mg Oral Daily   Or   thiamine  100 mg Intravenous Daily   umeclidinium bromide  1 puff Inhalation Daily   Continuous Infusions:   LOS: 4 days    Time spent: 40 minutes    Irine Seal, MD [facility]   To contact the attending provider between 7A-7P or the covering provider during after hours 7P-7A, please log into  the web site [email or web address] and access using universal [place] password for that web site. If you do not have the password, please call the hospital operator.  06/29/2020, 9:27 AM

## 2020-06-29 NOTE — Progress Notes (Signed)
Occupational Therapy Treatment Patient Details Name: Wendy Edwards MRN: [id number] DOB: May 04, 1966 Today's Date: 06/29/2020    History of present illness Pt admitted with Thrombocytopenia, Pancytopenia and is s/p fall with extensive facial bruising and sprained wrist.  Pt with hx of R TKR, COPD, aplatic anemia and ETOH abuse   OT comments  Pt will benefit from A at home with ADL activity.    Follow Up Recommendations  Home health OT (to assess safety during IADL)    Equipment Recommendations  Tub/shower bench    Recommendations for Other Services      Precautions / Restrictions Precautions Precautions: Fall       Mobility Bed Mobility Overal bed mobility: Needs Assistance Bed Mobility: Supine to Sit     Supine to sit: Supervision;HOB elevated     General bed mobility comments: for safety, increased time  Transfers Overall transfer level: Needs assistance Equipment used: Rolling walker (2 wheeled) Transfers: Sit to/from Stand Sit to Stand: Min guard         General transfer comment: min guard for safety, slow to rise and steady.    Balance Overall balance assessment: Needs assistance   Sitting balance-Leahy Scale: Good     Standing balance support: No upper extremity supported Standing balance-Leahy Scale: Fair Standing balance comment: statically                           ADL either performed or assessed with clinical judgement   ADL Overall ADL's : Needs assistance/impaired Eating/Feeding: Independent   Grooming: Wash/dry hands;Standing;Supervision/safety               Lower Body Dressing: Min guard;Sit to/from stand   Toilet Transfer: Ambulation;RW;Supervision/safety   Toileting- Water quality scientist and Hygiene: Min guard;Sit to/from stand         General ADL Comments: pt anxious to go home.  Pt will benefit from S     Vision Baseline Vision/History: No visual deficits            Cognition Arousal/Alertness:  Awake/alert Behavior During Therapy: WFL for tasks assessed/performed Overall Cognitive Status: Within Functional Limits for tasks assessed                                 General Comments: Pt appears distant, increased processing time to answer questions and follow verbal commands.                   Pertinent Vitals/ Pain       Pain Assessment: No/denies pain     Prior Functioning/Environment              Frequency  Min 2X/week        Progress Toward Goals  OT Goals(current goals can now be found in the care plan section)  Progress towards OT goals: Progressing toward goals     Plan Discharge plan remains appropriate       AM-PAC OT "6 Clicks" Daily Activity     Outcome Measure   Help from another person eating meals?: None Help from another person taking care of personal grooming?: A Little Help from another person toileting, which includes using toliet, bedpan, or urinal?: A Little Help from another person bathing (including washing, rinsing, drying)?: A Little Help from another person to put on and taking off regular upper body clothing?: None Help from another person to put on and  taking off regular lower body clothing?: A Little 6 Click Score: 20    End of Session Equipment Utilized During Treatment: Gait belt;Rolling walker  OT Visit Diagnosis: Other abnormalities of gait and mobility (R26.89)   Activity Tolerance Patient tolerated treatment well   Patient Left in bed;with call bell/phone within reach;with bed alarm set   Nurse Communication Mobility status        Time: 1455-1515 OT Time Calculation (min): 20 min  Charges: OT General Charges $OT Visit: 1 Visit OT Treatments $Self Care/Home Management : 8-22 mins  Kari Baars, [place] [phone] Office- [place], Edwena Felty D 06/29/2020, 6:33 PM

## 2020-06-29 NOTE — Care Management Important Message (Signed)
Important Message  Patient Details IM Letter given to Marney Doctor RN Case Manager to present to the Patient Name: PATRICI MINNIS MRN: [id number] Date of Birth: Feb 12, 1966   Medicare Important Message Given:  Yes     Kerin Salen 06/29/2020, 10:40 AM

## 2020-06-30 DIAGNOSIS — K7460 Unspecified cirrhosis of liver: Secondary | ICD-10-CM

## 2020-06-30 LAB — CBC WITH DIFFERENTIAL/PLATELET
Abs Immature Granulocytes: 0.01 10*3/uL (ref 0.00–0.07)
Basophils Absolute: 0 10*3/uL (ref 0.0–0.1)
Basophils Relative: 0 %
Eosinophils Absolute: 0 10*3/uL (ref 0.0–0.5)
Eosinophils Relative: 0 %
HCT: 30.3 % — ABNORMAL LOW (ref 36.0–46.0)
Hemoglobin: 9.9 g/dL — ABNORMAL LOW (ref 12.0–15.0)
Immature Granulocytes: 0 %
Lymphocytes Relative: 64 %
Lymphs Abs: 1.6 10*3/uL (ref 0.7–4.0)
MCH: 34.5 pg — ABNORMAL HIGH (ref 26.0–34.0)
MCHC: 32.7 g/dL (ref 30.0–36.0)
MCV: 105.6 fL — ABNORMAL HIGH (ref 80.0–100.0)
Monocytes Absolute: 0.6 10*3/uL (ref 0.1–1.0)
Monocytes Relative: 22 %
Neutro Abs: 0.3 10*3/uL — ABNORMAL LOW (ref 1.7–7.7)
Neutrophils Relative %: 14 %
Platelets: 24 10*3/uL — CL (ref 150–400)
RBC: 2.87 MIL/uL — ABNORMAL LOW (ref 3.87–5.11)
RDW: 21.1 % — ABNORMAL HIGH (ref 11.5–15.5)
WBC: 2.5 10*3/uL — ABNORMAL LOW (ref 4.0–10.5)
nRBC: 0 % (ref 0.0–0.2)

## 2020-06-30 LAB — BASIC METABOLIC PANEL
Anion gap: 10 (ref 5–15)
BUN: 25 mg/dL — ABNORMAL HIGH (ref 6–20)
CO2: 25 mmol/L (ref 22–32)
Calcium: 8.9 mg/dL (ref 8.9–10.3)
Chloride: 106 mmol/L (ref 98–111)
Creatinine, Ser: 0.45 mg/dL (ref 0.44–1.00)
GFR calc Af Amer: >60 mL/min (ref >60)
GFR calc non Af Amer: >60 mL/min (ref >60)
Glucose, Bld: 90 mg/dL (ref 70–99)
Potassium: 3.8 mmol/L (ref 3.5–5.1)
Sodium: 141 mmol/L (ref 135–145)

## 2020-06-30 LAB — INTRINSIC FACTOR ANTIBODIES: Intrinsic Factor: 0.9 AU/mL (ref 0.0–1.1)

## 2020-06-30 LAB — LACTATE DEHYDROGENASE: LDH: 152 U/L (ref 98–192)

## 2020-06-30 LAB — HAPTOGLOBIN: Haptoglobin: 85 mg/dL (ref 33–346)

## 2020-06-30 MED ORDER — PANTOPRAZOLE SODIUM 40 MG PO TBEC: 40.0000 mg | DELAYED_RELEASE_TABLET | Freq: Every day | ORAL | 1 refills | Status: DC

## 2020-06-30 MED ORDER — "BD SYRINGE/NEEDLE 25G X 5/8"" 1 ML MISC": 1000.0000 ug | Freq: Every day | 0 refills | Status: DC

## 2020-06-30 MED ORDER — FOLIC ACID 1 MG PO TABS: 1.0000 mg | ORAL_TABLET | Freq: Every day | ORAL | 1 refills | Status: DC

## 2020-06-30 MED ORDER — MOMETASONE FURO-FORMOTEROL FUM 100-5 MCG/ACT IN AERO: 2.0000 | INHALATION_SPRAY | Freq: Two times a day (BID) | RESPIRATORY_TRACT | 0 refills | Status: DC

## 2020-06-30 MED ORDER — CYANOCOBALAMIN 1000 MCG/ML IJ SOLN: 1000.0000 ug | Freq: Every day | INTRAMUSCULAR | 0 refills | Status: DC

## 2020-06-30 MED ORDER — UMECLIDINIUM BROMIDE 62.5 MCG/INH IN AEPB: 1.0000 | INHALATION_SPRAY | Freq: Every day | RESPIRATORY_TRACT | 0 refills | Status: DC

## 2020-06-30 MED ORDER — ADULT MULTIVITAMIN W/MINERALS CH: 1.0000 | ORAL_TABLET | Freq: Every day | ORAL | Status: DC

## 2020-06-30 MED ORDER — THIAMINE HCL 100 MG PO TABS: 100.0000 mg | ORAL_TABLET | Freq: Every day | ORAL | Status: DC

## 2020-06-30 NOTE — Discharge Summary (Signed)
Physician Discharge Summary  Wendy Edwards ZOX:[id number] DOB: 07-19-1966 DOA: 06/25/2020  PCP: No primary care provider on file.  Admit date: 06/25/2020 Discharge date: 06/30/2020  Time spent: 50 minutes  Recommendations for Outpatient Follow-up:  1. Follow-up with Dr. Marin Olp, hematology/oncology in 1 week.  On follow-up patient need a CBC with differential to follow-up on counts. 2. Patient will be discharged home with home health therapies. 3. Patient given information to follow-up with alcohol and drug services of [facility] to aid with alcohol cessation.   Discharge Diagnoses:  Principal Problem:   Pancytopenia ([place]) Active Problems:   ETOH abuse   COPD (chronic obstructive pulmonary disease) (HCC)   Thrombocytopenia (HCC)   Anxiety   GERD (gastroesophageal reflux disease)   Vitamin B12 deficiency   Pernicious anemia   Discharge Condition: Stable and improved  Diet recommendation: Regular  Filed Weights   06/25/20 2307  Weight: 55.4 kg    History of present illness:  HPI per Dr. [name] Wendy Edwards is a 54 y.o. female with medical history significant of remote Hx of aplastic anemia, chronic ambulation dysfunction using walker, anxiety/depression, binge alcohol drinking, presented with mechanical fall and severe abuse.  Patient reported 4 days ago, she tripped over Stairs at home, fell forward and hit her face.  She sustained some bruises on her forehead and both eyes.  She had some headache initially denied any blurred vision lightheadedness.  Over the last 3 days she has noticed the post has not improved, she also reported she developed a rash over her bilateral shin area for more than 2 to 3 weeks.  She admits to drinking beer almost every other day, but never more than 12 ounces.  And she was never in alcohol withdrawal before.  She further denied any easy bruising, no hematuria, no bloody stool or dark-colored stool no abdominal pain.  She further denied any chest  pain no short of breath. ED Course: Blood work shows pancytopenia, platelet 10, hemoglobin 8.2 from 14.9 last year, WBC 2.3  Hospital Course:  1 pancytopenia Questionable etiology.  Concern likely secondary to pernicious anemia as patient with a significant vitamin B12 deficiency versus recurrent aplastic anemia.  Patient status post 1 unit platelet transfusion as patient had presented with a fall and noted to have ecchymosis around her eyes.  Patient with prior history of aplastic anemia and initial concern was a relapse of aplastic anemia.  Patient seen in consultation by oncology who looked at patient's blood smear and did not notice any leukemic cells.  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  Methylmalonic acid pending, intrinsic factor antibodies pending, parietal cell antibodies at 4.1.  Homocystine level at 12.1. LDH within normal limits.  Patient with no noted bleeding.  CBC with 11/07/2020 had a white count of 2.0, hemoglobin of 6.3, platelet count of 10.  Patient subsequently was transfused 2 units packed red blood cells and 1 unit of platelets on 06/28/2020 with appropriate response.  Patient's counts improved such that by day of discharge hemoglobin has stabilized at 9.9, platelet count at 24, white count at 2.5.  Patient was maintained on vitamin B12 IM injections daily as well as folic acid daily during the hospitalization.  Patient will be discharged home in stable and improved condition on vitamin B12 IM injections daily x3 days, then weekly  x1 month, then monthly.  Bone marrow biopsy which was initially recommended per hematology/oncology was subsequently canceled as it was felt patient's pancytopenia likely secondary from vitamin B12 deficiency.  Patient improved clinically and patient be discharged  home in stable and improved condition with close outpatient follow-up with hematology/oncology 1 week.  2.  Vitamin B12 deficiency/pernicious anemia See problem #1.  Patient started on vitamin B12 IM injections daily and folic acid.  Methylmalonic acid pending at time of discharge.  Homocysteine level at 12.1.  Parietal cell antibodies of 4.1.  Will be discharged home on vitamin B12 IM injections daily x3 days, then weekly x1 month, then monthly.  Outpatient follow-up with hematology.  3.  Alcohol abuse Alcohol cessation stressed to patient.  Patient with no withdrawal symptoms throughout the hospitalization.  Patient was placed on thiamine, folic acid, multivitamin as well as Ativan withdrawal protocol.  Patient seen by social work and patient given information on alcohol cessation.  Outpatient follow-up.  4.  Anxiety Patient maintained on home regimen of Xanax as needed.  5.  Chronic ambulatory dysfunction/fall PT/OT.  6.  Gastroesophageal reflux disease Patient was maintained on a PPI.  7.  COPD Stable.    Patient was started on Dulera, Incruse and placed on duo nebs as needed.  Remained stable.    8.  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  As noted on ultrasound.    Remained compensated and stable throughout the hospitalization.  Alcohol cessation was stressed to patient.  Outpatient follow-up with PCP or GI.     Procedures  Transfusion 1 unit platelets 06/25/2020  CT head/CT C-spine/CT maxillofacial 06/25/2020  Plain films of the left hand 06/25/2020  Right upper quadrant ultrasound 06/25/2020  Transfusion 2 units packed red blood cells 06/28/2020   Transfusion 1 unit platelets 06/28/2020   Consultations:  Oncology: Dr. Marin Olp 06/25/2020  Discharge Exam: Vitals:   06/30/20 0716 06/30/20 1224  BP:  122/70  Pulse:  89  Resp:  15  Temp:  98 F (36.7 C)  SpO2: 95% 96%    General: NAD.  Ecchymosis noted around both eyes and forehead. Cardiovascular: RRR Respiratory:  CTAB  Discharge Instructions   Discharge Instructions    Diet general   Complete by: As directed    Discharge wound care:   Complete by: As directed    Per wound care   Increase activity slowly   Complete by: As directed      Allergies as of 06/30/2020      Reactions   Bee Venom Hives   Phenergan [promethazine] Other (See Comments)   "made me shaky"      Medication List    TAKE these medications   acetaminophen 500 MG tablet Commonly known as: TYLENOL Take 1,000 mg by mouth every 6 (six) hours as needed for mild pain.   albuterol 1.25 MG/3ML nebulizer solution Commonly known as: ACCUNEB TAKE 3 ML(1 VIAL) BY NEBULIZATION 3 TIMES DAILY AS NEEDED FOR WHEEZING.   ProAir HFA 108 (90 Base) MCG/ACT inhaler Generic drug: albuterol INHALE 2 PUFFS BY MOUTH EVERY 6 HOURS AS NEEDED   ALPRAZolam 1 MG tablet Commonly known as: XANAX TAKE 1 TABLET BY MOUTH THREE TIMES A DAY AS NEEDED   B-D SYRINGE/NEEDLE 1CC/25GX5/8 25G X 5/8" 1 ML Misc Generic drug: SYRINGE/NEEDLE (DISP) 1 ML 1,000 mcg by Does not apply route daily. 1cc BD syringe for vitamin B12 shots.   cyanocobalamin 1000 MCG/ML injection Commonly known as: (VITAMIN B-12) Inject 1 mL (1,000 mcg total) into  the muscle daily. Take 1054mg IM daily x 3 days, then weekly x 1 month, then monthly. Start taking on: July 14, 22094  folic acid 1 MG tablet Commonly known as: FOLVITE Take 1 tablet (1 mg total) by mouth daily. Start taking on: July 01, 2020   mometasone-formoterol 100-5 MCG/ACT Aero Commonly known as: DULERA Inhale 2 puffs into the lungs 2 (two) times daily.   multivitamin with minerals Tabs tablet Take 1 tablet by mouth daily. Start taking on: July 01, 2020   pantoprazole 40 MG tablet Commonly known as: PROTONIX Take 1 tablet (40 mg total) by mouth daily at 6 (six) AM. Start taking on: July 01, 2020   thiamine 100 MG tablet Take 1 tablet (100 mg total) by mouth daily. Start taking on: July 01, 2020    umeclidinium bromide 62.5 MCG/INH Aepb Commonly known as: INCRUSE ELLIPTA Inhale 1 puff into the lungs daily. Start taking on: July 01, 2020            Durable Medical Equipment  (From admission, onward)         Start     Ordered   06/30/20 1356  For home use only DME Walker rolling  Once       Question Answer Comment  Walker: With [employer]   Patient needs a walker to treat with the following condition Weakness      06/30/20 1355   06/27/20 1652  For home use only DME Tub bench  Once        06/27/20 1652           Discharge Care Instructions  (From admission, onward)         Start     Ordered   06/30/20 0000  Discharge wound care:       Comments: Per wound care   06/30/20 1327         Allergies  Allergen Reactions  . Bee Venom Hives  . Phenergan [Promethazine] Other (See Comments)    "made me shaky"    Follow-up Information    [facility]   Why:: [phone]      EVolanda Napoleon MD. Schedule an appointment as soon as possible for a visit in 1 week(s).   Specialty: Oncology Contact information: [phone]       [facility]   Specialty: [place]Why: For home health services Contact information: [address] [phone]               The results of significant diagnostics from this hospitalization (including imaging, microbiology, ancillary and laboratory) are listed below for reference.    Significant Diagnostic Studies: CT Head Wo Contrast  Result Date: 06/25/2020 CLINICAL DATA:  Fall on Saturday. Altered vision in both eyes. Bilateral periorbital hematomas and facial abrasions. EXAM: CT HEAD WITHOUT CONTRAST CT MAXILLOFACIAL WITHOUT CONTRAST CT CERVICAL SPINE WITHOUT CONTRAST TECHNIQUE: Multidetector CT imaging of the head, cervical spine, and  maxillofacial structures were performed using the standard protocol without intravenous contrast. Multiplanar CT image reconstructions of the cervical spine and maxillofacial structures were also generated. COMPARISON:  MRI brain from 09/27/2018 FINDINGS: CT HEAD FINDINGS Brain: Small peripheral linear hypodensities in the left cerebellum, not changed from prior MRI, suggesting small remote left cerebellar infarcts. The brainstem, thalami, and basal ganglia appear unremarkable.  The ventricular system and basilar cisterns appear normal. No intracranial hemorrhage, mass lesion, or acute CVA. Vascular: There is atherosclerotic calcification of the cavernous carotid arteries bilaterally. Skull: Unremarkable Other: No supplemental non-categorized findings. CT MAXILLOFACIAL FINDINGS Osseous: No facial fracture is identified. Orbits: No intraorbital abnormality is observed. Sinuses: Unremarkable Soft tissues: Large bilateral periorbital hematomas. Left frontal scalp hematoma along the forehead. CT CERVICAL SPINE FINDINGS Alignment: No vertebral subluxation is observed. Skull base and vertebrae: Small bilateral C7 cervical ribs. No cervical spine fracture or acute subluxation. Soft tissues and spinal canal: Bilateral common carotid atherosclerotic calcification. Disc levels:  No impingement identified. Upper chest: Unremarkable Other: No supplemental non-categorized findings. IMPRESSION: 1. No acute intracranial findings, acute cervical spine findings, or facial fracture. 2. Large bilateral periorbital hematomas. Left frontal scalp hematoma along the forehead. 3. Small remote left cerebellar infarcts. 4. Bilateral common carotid atherosclerotic calcification. 5. Small bilateral C7 cervical ribs. Electronically Signed   By: Van Clines M.D.   On: 06/25/2020 12:40   CT Cervical Spine Wo Contrast  Result Date: 06/25/2020 CLINICAL DATA:  Fall on Saturday. Altered vision in both eyes. Bilateral periorbital hematomas  and facial abrasions. EXAM: CT HEAD WITHOUT CONTRAST CT MAXILLOFACIAL WITHOUT CONTRAST CT CERVICAL SPINE WITHOUT CONTRAST TECHNIQUE: Multidetector CT imaging of the head, cervical spine, and maxillofacial structures were performed using the standard protocol without intravenous contrast. Multiplanar CT image reconstructions of the cervical spine and maxillofacial structures were also generated. COMPARISON:  MRI brain from 09/27/2018 FINDINGS: CT HEAD FINDINGS Brain: Small peripheral linear hypodensities in the left cerebellum, not changed from prior MRI, suggesting small remote left cerebellar infarcts. The brainstem, thalami, and basal ganglia appear unremarkable. The ventricular system and basilar cisterns appear normal. No intracranial hemorrhage, mass lesion, or acute CVA. Vascular: There is atherosclerotic calcification of the cavernous carotid arteries bilaterally. Skull: Unremarkable Other: No supplemental non-categorized findings. CT MAXILLOFACIAL FINDINGS Osseous: No facial fracture is identified. Orbits: No intraorbital abnormality is observed. Sinuses: Unremarkable Soft tissues: Large bilateral periorbital hematomas. Left frontal scalp hematoma along the forehead. CT CERVICAL SPINE FINDINGS Alignment: No vertebral subluxation is observed. Skull base and vertebrae: Small bilateral C7 cervical ribs. No cervical spine fracture or acute subluxation. Soft tissues and spinal canal: Bilateral common carotid atherosclerotic calcification. Disc levels:  No impingement identified. Upper chest: Unremarkable Other: No supplemental non-categorized findings. IMPRESSION: 1. No acute intracranial findings, acute cervical spine findings, or facial fracture. 2. Large bilateral periorbital hematomas. Left frontal scalp hematoma along the forehead. 3. Small remote left cerebellar infarcts. 4. Bilateral common carotid atherosclerotic calcification. 5. Small bilateral C7 cervical ribs. Electronically Signed   By: Van Clines M.D.   On: 06/25/2020 12:40   DG Hand Complete Left  Result Date: 06/25/2020 CLINICAL DATA:  Fall on Saturday and tried to catch self with left hand, complains of generalized lt hand pain EXAM: LEFT HAND - COMPLETE 3+ VIEW COMPARISON:  None. FINDINGS: Osteopenia. There is no evidence of fracture or dislocation. Scattered degenerative change. No focal bone lesion. Soft tissues are unremarkable. IMPRESSION: No acute osseous abnormality in the left hand. Electronically Signed   By: Audie Pinto M.D.   On: 06/25/2020 13:23   CT Maxillofacial Wo Contrast  Result Date: 06/25/2020 CLINICAL DATA:  Fall on Saturday. Altered vision in both eyes. Bilateral periorbital hematomas and facial abrasions. EXAM: CT HEAD WITHOUT CONTRAST CT MAXILLOFACIAL WITHOUT CONTRAST CT CERVICAL SPINE WITHOUT CONTRAST TECHNIQUE: Multidetector CT imaging of the head, cervical spine, and maxillofacial structures were performed using the  standard protocol without intravenous contrast. Multiplanar CT image reconstructions of the cervical spine and maxillofacial structures were also generated. COMPARISON:  MRI brain from 09/27/2018 FINDINGS: CT HEAD FINDINGS Brain: Small peripheral linear hypodensities in the left cerebellum, not changed from prior MRI, suggesting small remote left cerebellar infarcts. The brainstem, thalami, and basal ganglia appear unremarkable. The ventricular system and basilar cisterns appear normal. No intracranial hemorrhage, mass lesion, or acute CVA. Vascular: There is atherosclerotic calcification of the cavernous carotid arteries bilaterally. Skull: Unremarkable Other: No supplemental non-categorized findings. CT MAXILLOFACIAL FINDINGS Osseous: No facial fracture is identified. Orbits: No intraorbital abnormality is observed. Sinuses: Unremarkable Soft tissues: Large bilateral periorbital hematomas. Left frontal scalp hematoma along the forehead. CT CERVICAL SPINE FINDINGS Alignment: No vertebral  subluxation is observed. Skull base and vertebrae: Small bilateral C7 cervical ribs. No cervical spine fracture or acute subluxation. Soft tissues and spinal canal: Bilateral common carotid atherosclerotic calcification. Disc levels:  No impingement identified. Upper chest: Unremarkable Other: No supplemental non-categorized findings. IMPRESSION: 1. No acute intracranial findings, acute cervical spine findings, or facial fracture. 2. Large bilateral periorbital hematomas. Left frontal scalp hematoma along the forehead. 3. Small remote left cerebellar infarcts. 4. Bilateral common carotid atherosclerotic calcification. 5. Small bilateral C7 cervical ribs. Electronically Signed   By: Van Clines M.D.   On: 06/25/2020 12:40   US Abdomen Limited RUQ  Result Date: 06/25/2020 CLINICAL DATA:  54 year old female with cirrhosis.  Recent fall. EXAM: ULTRASOUND ABDOMEN LIMITED RIGHT UPPER QUADRANT COMPARISON:  CT of the abdomen pelvis dated 01/13/2019. FINDINGS: Gallbladder: No gallstones or wall thickening visualized. No sonographic Murphy sign noted by sonographer. Common bile duct: Diameter: 4 mm Liver: There is coarsened liver echotexture with increased echogenicity and slight surface nodularity in keeping with history of cirrhosis. Portal vein is patent on color Doppler imaging with normal direction of blood flow towards the liver. Other: None. IMPRESSION: 1. Morphologic changes of of the liver in keeping with provided history of cirrhosis. 2. Patent main portal vein with hepatopetal flow. 3. No ascites. Electronically Signed   By: Anner Crete M.D.   On: 06/25/2020 19:02    Microbiology: Recent Results (from the past 240 hour(s))  SARS Coronavirus 2 by RT PCR (hospital order, performed in [facility] hospital lab) Nasopharyngeal Nasopharyngeal Swab     Status: None   Collection Time: 06/25/20  4:57 PM   Specimen: Nasopharyngeal Swab  Result Value Ref Range Status   SARS Coronavirus 2 NEGATIVE  NEGATIVE Final    Comment: (NOTE) SARS-CoV-2 target nucleic acids are NOT DETECTED.  The SARS-CoV-2 RNA is generally detectable in upper and lower respiratory specimens during the acute phase of infection. The lowest concentration of SARS-CoV-2 viral copies this assay can detect is 250 copies / mL. A negative result does not preclude SARS-CoV-2 infection and should not be used as the sole basis for treatment or other patient management decisions.  A negative result may occur with improper specimen collection / handling, submission of specimen other than nasopharyngeal swab, presence of viral mutation(s) within the areas targeted by this assay, and inadequate number of viral copies (<250 copies / mL). A negative result must be combined with clinical observations, patient history, and epidemiological information.  Fact Sheet for Patients:   StrictlyIdeas.no  Fact Sheet for Healthcare Providers: BankingDealers.co.za  This test is not yet approved or  cleared by the [place] FDA and has been authorized for detection and/or diagnosis of SARS-CoV-2 by FDA under an Emergency Use Authorization (EUA).  This EUA will remain in effect (meaning this test can be used) for the duration of the COVID-19 declaration under Section 564(b)(1) of the Act, 21 U.S.C. section 360bbb-3(b)(1), unless the authorization is terminated or revoked sooner.  Performed at [facility], [place] [address], [place], [place] [address]      Labs: Basic Metabolic Panel: Recent Labs  Lab 06/26/20 0507 06/27/20 0606 06/28/20 0612 06/29/20 0621 06/30/20 0456  NA 139 137 141 141 141  K 3.8 4.3 3.8 3.9 3.8  CL 106 105 108 106 106  CO2 _0 GLUCOSE 76 118* 91 82 90  BUN 17 16 21* 25* 25*  CREATININE 0.47 0.51 0.60 0.52 0.45  CALCIUM 8.6* 8.6* 8.6* 8.8* 8.9  MG  --  2.1 2.0  --   --   PHOS  --   --  4.1  --   --    Liver Function  Tests: Recent Labs  Lab 06/25/20 1657 06/26/20 0507 06/27/20 0606 06/28/20 0612 06/29/20 0621  AST 15 13* 13* 11* 15  ALT _1 ALKPHOS 113 86 91 83 87  BILITOT 0.5 0.8 0.3 0.4 0.6  PROT 7.0 6.2* 6.1* 5.8* 6.0*  ALBUMIN 4.0 3.5 3.3* 3.1* 3.3*   No results for input(s): LIPASE, AMYLASE in the last 168 hours. No results for input(s): AMMONIA in the last 168 hours. CBC: Recent Labs  Lab 06/25/20 1224 06/25/20 1224 06/26/20 0507 06/27/20 0606 06/28/20 0612 06/29/20 0621 06/30/20 0456  WBC 2.3*   < > 2.2* 2.1* 2.0* 2.5* 2.5*  NEUTROABS 0.5*  --   --  0.2* 0.2* 0.3* 0.3*  HGB 8.2*   < > 7.3* 7.2* 6.3* 10.1* 9.9*  HCT 25.7*   < > 22.1* 21.7* 20.0* 30.7* 30.3*  MCV 114.7*   < > 115.7* 117.3* 117.6* 103.7* 105.6*  PLT 10*   < > 15* 16* 10* 25* 24*   < > = values in this interval not displayed.   Cardiac Enzymes: No results for input(s): CKTOTAL, CKMB, CKMBINDEX, TROPONINI in the last 168 hours. BNP: BNP (last 3 results) No results for input(s): BNP in the last 8760 hours.  ProBNP (last 3 results) No results for input(s): PROBNP in the last 8760 hours.  CBG: No results for input(s): GLUCAP in the last 168 hours.     Signed:  Irine Seal MD.  [facility] 06/30/2020, 3:02 PM

## 2020-06-30 NOTE — TOC Initial Note (Signed)
Transition of Care [facility]) - Initial/Assessment Note    Patient Details  Name: Wendy Edwards MRN: [id number] Date of Birth: 04-10-1966  Transition of Care [facility]) CM/SW Contact:    Lynnell Catalan, RN Phone Number: 06/30/2020, 2:01 PM  Clinical Narrative:                 This CM spoke with pt at bedside for dc planning. Pt states she would like to go home with home health services. Choice offered and [facility] chosen. Tub seat had been ordered by MD but pt states she has a shower chair already and doesn't need one. Pt does state that when she fell her walker bent. This CM contacted [employer] liaison for another RW.   Expected Discharge Plan: [place] Barriers to Discharge: No Barriers Identified   Patient Goals and CMS Choice Patient states their goals for this hospitalization and ongoing recovery are:: To get home CMS Medicare.gov Compare Post Acute Care list provided to:: Patient Choice offered to / list presented to : Patient  Expected Discharge Plan and Services Expected Discharge Plan: [place] In-house Referral: Clinical Social Work Discharge Planning Services: CM Consult Post Acute Care Choice: [place] arrangements for the past 2 months: Apartment Expected Discharge Date: 06/30/20               DME Arranged: Gilford Rile rolling DME Agency: [employer] Date DME Agency Contacted: 06/30/20 Time DME Agency Contacted: 91 Representative spoke with at DME Agency: Thedore Mins HH Arranged: RN, PT, OT, Nurse's Aide, Social Work [employer] Agency: [place] Date [place]: 06/30/20 Time [place]: 4 Representative spoke with at [place]: [place] Arrangements/Services Living arrangements for the past 2 months: [place] with:: Adult Children Patient language and need for interpreter reviewed:: Yes Do you feel safe going back to the place where you live?: Yes      Need for Family Participation in  Patient Care: Yes (Comment) Care giver support system in place?: Yes (comment) Current home services: DME (rolling walker, bedside commode, cane) Criminal Activity/Legal Involvement Pertinent to Current Situation/Hospitalization: No - Comment as needed  Activities of Daily Living Home Assistive Devices/Equipment: Environmental consultant (specify type) ADL Screening (condition at time of admission) Patient's cognitive ability adequate to safely complete daily activities?: Yes Is the patient deaf or have difficulty hearing?: No Does the patient have difficulty seeing, even when wearing glasses/contacts?: No Does the patient have difficulty concentrating, remembering, or making decisions?: No Patient able to express need for assistance with ADLs?: Yes Does the patient have difficulty dressing or bathing?: No Independently performs ADLs?: Yes (appropriate for developmental age) Does the patient have difficulty walking or climbing stairs?: No Weakness of Legs: Both Weakness of Arms/Hands: Left (Brace )  Permission Sought/Granted Permission sought to share information with : Facility Art therapist granted to share information with : Yes, Verbal Permission Granted  Share Information with NAME: Lucie Leather  Permission granted to share info w AGENCY: SNF, Home Health  Permission granted to share info w Relationship: son  Permission granted to share info w Contact Information: [phone]  Emotional Assessment Appearance:: Appears stated age Attitude/Demeanor/Rapport: Engaged Affect (typically observed): Accepting Orientation: : Oriented to Self, Oriented to Place, Oriented to  Time, Oriented to Situation      Admission diagnosis:  Cirrhosis ([place]) [K74.60] Thrombocytopenia ([place]) [D69.6] Aplastic anemia ([place]) [D61.9] Pancytopenia ([place]) [D61.818] Fall, initial encounter [W19.XXXA] Patient Active Problem List  Diagnosis Date Noted   Pancytopenia ([place]) 06/26/2020   Anxiety  06/26/2020   GERD (gastroesophageal reflux disease) 06/26/2020   Vitamin B12 deficiency 06/26/2020   Pernicious anemia    Thrombocytopenia ([place]) 06/25/2020   Fall    Aplastic anemia ([place])    Altered mental status 57/90/3833   Metabolic acidosis 38/32/9191   Malnutrition ([place]) 09/25/2018   ETOH abuse 09/25/2018   Sepsis ([place]) 09/25/2018   COPD (chronic obstructive pulmonary disease) ([place]) 09/25/2018   Chronic bilateral low back pain without sciatica 07/11/2018   Screening for breast cancer 07/11/2018   S/P revision of total knee, 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 Pharmacy:   [id number] - [place], [place] [address] Phone: [phone] Fax: [phone]     Social Determinants of Health (SDOH) Interventions    Readmission Risk Interventions No flowsheet data found.

## 2020-07-02 ENCOUNTER — Other Ambulatory Visit: Payer: Self-pay | Admitting: Hematology & Oncology

## 2020-07-05 ENCOUNTER — Encounter: Payer: Self-pay | Admitting: Hematology & Oncology

## 2020-07-05 ENCOUNTER — Other Ambulatory Visit: Payer: Self-pay | Admitting: Hematology & Oncology

## 2020-07-05 DIAGNOSIS — Z7189 Other specified counseling: Secondary | ICD-10-CM

## 2020-07-05 HISTORY — DX: Other specified counseling: Z71.89

## 2020-07-07 ENCOUNTER — Telehealth: Payer: Self-pay | Admitting: Family

## 2020-07-07 NOTE — Telephone Encounter (Signed)
Called and advised patient of upcoming appointments per 7/19 & 7/20 sch msg

## 2020-07-09 ENCOUNTER — Inpatient Hospital Stay: Payer: Medicare Other

## 2020-07-09 ENCOUNTER — Inpatient Hospital Stay: Payer: Medicare Other | Attending: Hematology & Oncology

## 2020-07-09 ENCOUNTER — Inpatient Hospital Stay: Payer: Medicare Other | Admitting: Family

## 2020-07-16 ENCOUNTER — Inpatient Hospital Stay: Payer: Medicare Other

## 2020-07-23 ENCOUNTER — Inpatient Hospital Stay: Payer: Medicare Other | Attending: Hematology & Oncology

## 2020-07-30 ENCOUNTER — Inpatient Hospital Stay: Payer: Medicare Other

## 2020-08-03 ENCOUNTER — Other Ambulatory Visit: Payer: Self-pay | Admitting: Hematology & Oncology

## 2020-08-06 ENCOUNTER — Ambulatory Visit: Payer: Medicare Other | Admitting: Hematology & Oncology

## 2020-08-06 ENCOUNTER — Other Ambulatory Visit: Payer: Self-pay | Admitting: Hematology & Oncology

## 2020-08-06 ENCOUNTER — Inpatient Hospital Stay: Payer: Medicare Other

## 2020-08-13 ENCOUNTER — Ambulatory Visit: Payer: Medicare Other | Admitting: Family

## 2020-08-13 ENCOUNTER — Inpatient Hospital Stay: Payer: Medicare Other

## 2020-08-13 ENCOUNTER — Ambulatory Visit: Payer: Medicare Other

## 2020-11-04 ENCOUNTER — Other Ambulatory Visit: Payer: Self-pay | Admitting: Hematology & Oncology

## 2020-11-29 ENCOUNTER — Emergency Department (HOSPITAL_COMMUNITY): Payer: Medicare Other

## 2020-11-29 ENCOUNTER — Other Ambulatory Visit: Payer: Self-pay

## 2020-11-29 ENCOUNTER — Other Ambulatory Visit: Payer: Self-pay | Admitting: Oncology

## 2020-11-29 ENCOUNTER — Inpatient Hospital Stay (HOSPITAL_COMMUNITY)
Admission: EM | Admit: 2020-11-29 | Discharge: 2020-12-02 | DRG: 835 | Disposition: A | Discharge status: Home Health | Payer: Medicare Other | Attending: Internal Medicine | Admitting: Internal Medicine

## 2020-11-29 ENCOUNTER — Encounter (HOSPITAL_COMMUNITY): Payer: Self-pay | Admitting: Emergency Medicine

## 2020-11-29 DIAGNOSIS — K7460 Unspecified cirrhosis of liver: Secondary | ICD-10-CM | POA: Diagnosis not present

## 2020-11-29 DIAGNOSIS — Z8249 Family history of ischemic heart disease and other diseases of the circulatory system: Secondary | ICD-10-CM | POA: Diagnosis not present

## 2020-11-29 DIAGNOSIS — Z79899 Other long term (current) drug therapy: Secondary | ICD-10-CM

## 2020-11-29 DIAGNOSIS — R7401 Elevation of levels of liver transaminase levels: Secondary | ICD-10-CM

## 2020-11-29 DIAGNOSIS — Z6821 Body mass index (BMI) 21.0-21.9, adult: Secondary | ICD-10-CM

## 2020-11-29 DIAGNOSIS — I248 Other forms of acute ischemic heart disease: Secondary | ICD-10-CM | POA: Diagnosis not present

## 2020-11-29 DIAGNOSIS — Z20822 Contact with and (suspected) exposure to covid-19: Secondary | ICD-10-CM | POA: Diagnosis present

## 2020-11-29 DIAGNOSIS — Z515 Encounter for palliative care: Secondary | ICD-10-CM | POA: Diagnosis not present

## 2020-11-29 DIAGNOSIS — Z9181 History of falling: Secondary | ICD-10-CM

## 2020-11-29 DIAGNOSIS — R200 Anesthesia of skin: Secondary | ICD-10-CM | POA: Diagnosis not present

## 2020-11-29 DIAGNOSIS — E46 Unspecified protein-calorie malnutrition: Secondary | ICD-10-CM | POA: Diagnosis present

## 2020-11-29 DIAGNOSIS — R079 Chest pain, unspecified: Secondary | ICD-10-CM | POA: Diagnosis not present

## 2020-11-29 DIAGNOSIS — F419 Anxiety disorder, unspecified: Secondary | ICD-10-CM | POA: Diagnosis present

## 2020-11-29 DIAGNOSIS — Z96651 Presence of right artificial knee joint: Secondary | ICD-10-CM | POA: Diagnosis not present

## 2020-11-29 DIAGNOSIS — D696 Thrombocytopenia, unspecified: Secondary | ICD-10-CM | POA: Diagnosis present

## 2020-11-29 DIAGNOSIS — K7030 Alcoholic cirrhosis of liver without ascites: Secondary | ICD-10-CM | POA: Diagnosis present

## 2020-11-29 DIAGNOSIS — D539 Nutritional anemia, unspecified: Secondary | ICD-10-CM | POA: Diagnosis present

## 2020-11-29 DIAGNOSIS — D61818 Other pancytopenia: Secondary | ICD-10-CM | POA: Diagnosis not present

## 2020-11-29 DIAGNOSIS — E538 Deficiency of other specified B group vitamins: Secondary | ICD-10-CM | POA: Diagnosis not present

## 2020-11-29 DIAGNOSIS — Z809 Family history of malignant neoplasm, unspecified: Secondary | ICD-10-CM

## 2020-11-29 DIAGNOSIS — C9200 Acute myeloblastic leukemia, not having achieved remission: Principal | ICD-10-CM | POA: Diagnosis present

## 2020-11-29 DIAGNOSIS — M79642 Pain in left hand: Secondary | ICD-10-CM

## 2020-11-29 DIAGNOSIS — F1010 Alcohol abuse, uncomplicated: Secondary | ICD-10-CM | POA: Diagnosis present

## 2020-11-29 DIAGNOSIS — Z7951 Long term (current) use of inhaled steroids: Secondary | ICD-10-CM

## 2020-11-29 DIAGNOSIS — K219 Gastro-esophageal reflux disease without esophagitis: Secondary | ICD-10-CM | POA: Diagnosis not present

## 2020-11-29 DIAGNOSIS — E440 Moderate protein-calorie malnutrition: Secondary | ICD-10-CM | POA: Diagnosis present

## 2020-11-29 DIAGNOSIS — J449 Chronic obstructive pulmonary disease, unspecified: Secondary | ICD-10-CM | POA: Diagnosis present

## 2020-11-29 DIAGNOSIS — F17210 Nicotine dependence, cigarettes, uncomplicated: Secondary | ICD-10-CM | POA: Diagnosis present

## 2020-11-29 DIAGNOSIS — R778 Other specified abnormalities of plasma proteins: Secondary | ICD-10-CM | POA: Diagnosis not present

## 2020-11-29 DIAGNOSIS — W19XXXA Unspecified fall, initial encounter: Secondary | ICD-10-CM | POA: Diagnosis present

## 2020-11-29 DIAGNOSIS — K760 Fatty (change of) liver, not elsewhere classified: Secondary | ICD-10-CM | POA: Diagnosis not present

## 2020-11-29 DIAGNOSIS — K7689 Other specified diseases of liver: Secondary | ICD-10-CM | POA: Diagnosis not present

## 2020-11-29 DIAGNOSIS — Z7189 Other specified counseling: Secondary | ICD-10-CM | POA: Diagnosis not present

## 2020-11-29 DIAGNOSIS — I10 Essential (primary) hypertension: Secondary | ICD-10-CM | POA: Diagnosis present

## 2020-11-29 DIAGNOSIS — Z720 Tobacco use: Secondary | ICD-10-CM | POA: Diagnosis present

## 2020-11-29 LAB — TROPONIN I (HIGH SENSITIVITY): Troponin I (High Sensitivity): 59 ng/L — ABNORMAL HIGH (ref <18)

## 2020-11-29 LAB — FIBRINOGEN: Fibrinogen: 289 mg/dL (ref 210–475)

## 2020-11-29 LAB — PREPARE RBC (CROSSMATCH)

## 2020-11-29 LAB — RETICULOCYTES
Immature Retic Fract: 16 % — ABNORMAL HIGH (ref 2.3–15.9)
RBC.: 1.14 MIL/uL — ABNORMAL LOW (ref 3.87–5.11)
Retic Count, Absolute: 28 10*3/uL (ref 19.0–186.0)
Retic Ct Pct: 2.5 % (ref 0.4–3.1)

## 2020-11-29 LAB — CBC
HCT: 15.6 % — ABNORMAL LOW (ref 36.0–46.0)
Hemoglobin: 5 g/dL — CL (ref 12.0–15.0)
MCH: 38.5 pg — ABNORMAL HIGH (ref 26.0–34.0)
MCHC: 32.1 g/dL (ref 30.0–36.0)
MCV: 120 fL — ABNORMAL HIGH (ref 80.0–100.0)
Platelets: 13 10*3/uL — CL (ref 150–400)
RBC: 1.3 MIL/uL — ABNORMAL LOW (ref 3.87–5.11)
RDW: 15.1 % (ref 11.5–15.5)
WBC: 3.4 10*3/uL — ABNORMAL LOW (ref 4.0–10.5)
nRBC: 1.7 % — ABNORMAL HIGH (ref 0.0–0.2)

## 2020-11-29 LAB — IRON AND TIBC
Iron: 275 ug/dL — ABNORMAL HIGH (ref 28–170)
Saturation Ratios: 72 % — ABNORMAL HIGH (ref 10.4–31.8)
TIBC: 384 ug/dL (ref 250–450)
UIBC: 109 ug/dL

## 2020-11-29 LAB — BASIC METABOLIC PANEL
Anion gap: 13 (ref 5–15)
BUN: 7 mg/dL (ref 6–20)
CO2: 21 mmol/L — ABNORMAL LOW (ref 22–32)
Calcium: 8.3 mg/dL — ABNORMAL LOW (ref 8.9–10.3)
Chloride: 104 mmol/L (ref 98–111)
Creatinine, Ser: 0.44 mg/dL (ref 0.44–1.00)
GFR, Estimated: >60 mL/min (ref >60)
Glucose, Bld: 109 mg/dL — ABNORMAL HIGH (ref 70–99)
Potassium: 3.9 mmol/L (ref 3.5–5.1)
Sodium: 138 mmol/L (ref 135–145)

## 2020-11-29 LAB — HEPATIC FUNCTION PANEL
ALT: 21 U/L (ref 0–44)
AST: 29 U/L (ref 15–41)
Albumin: 3.4 g/dL — ABNORMAL LOW (ref 3.5–5.0)
Alkaline Phosphatase: 64 U/L (ref 38–126)
Bilirubin, Direct: 0.1 mg/dL (ref 0.0–0.2)
Indirect Bilirubin: 0.4 mg/dL (ref 0.3–0.9)
Total Bilirubin: 0.5 mg/dL (ref 0.3–1.2)
Total Protein: 6.1 g/dL — ABNORMAL LOW (ref 6.5–8.1)

## 2020-11-29 LAB — RESP PANEL BY RT-PCR (FLU A&B, COVID) ARPGX2
Influenza A by PCR: NEGATIVE
Influenza B by PCR: NEGATIVE
SARS Coronavirus 2 by RT PCR: NEGATIVE

## 2020-11-29 LAB — SALICYLATE LEVEL: Salicylate Lvl: <7 mg/dL — ABNORMAL LOW (ref 7.0–30.0)

## 2020-11-29 LAB — PROTIME-INR
INR: 1.3 — ABNORMAL HIGH (ref 0.8–1.2)
Prothrombin Time: 15.7 seconds — ABNORMAL HIGH (ref 11.4–15.2)

## 2020-11-29 LAB — FERRITIN: Ferritin: 103 ng/mL (ref 11–307)

## 2020-11-29 LAB — VITAMIN B12: Vitamin B-12: 176 pg/mL — ABNORMAL LOW (ref 180–914)

## 2020-11-29 LAB — LACTATE DEHYDROGENASE: LDH: 214 U/L — ABNORMAL HIGH (ref 98–192)

## 2020-11-29 LAB — ETHANOL: Alcohol, Ethyl (B): <10 mg/dL (ref <10)

## 2020-11-29 LAB — ACETAMINOPHEN LEVEL: Acetaminophen (Tylenol), Serum: <10 ug/mL — ABNORMAL LOW (ref 10–30)

## 2020-11-29 LAB — FOLATE: Folate: 9.2 ng/mL (ref >5.9)

## 2020-11-29 MED ORDER — SODIUM CHLORIDE 0.9 % IV BOLUS
1000.0000 mL | Freq: Once | INTRAVENOUS | Status: AC
  Administered 2020-11-29: 14:00:00 1000 mL via INTRAVENOUS

## 2020-11-29 MED ORDER — OXYCODONE-ACETAMINOPHEN 5-325 MG PO TABS
1.0000 | ORAL_TABLET | Freq: Once | ORAL | Status: AC
  Administered 2020-11-29: 14:00:00 1 via ORAL
  Filled 2020-11-29: qty 1

## 2020-11-29 MED ORDER — SODIUM CHLORIDE 0.9% IV SOLUTION: Freq: Once | INTRAVENOUS | Status: AC

## 2020-11-29 NOTE — ED Notes (Signed)
Transfusion complete,pt shows no sign of reaction, see flowsheet for vitals. On monitor x4, will continue to monitor.

## 2020-11-29 NOTE — Hospital Course (Signed)
Wendy Edwards is a 54 yo female with PMH chronic alcohol abuse, cirrhosis w/o ascites, tobacco use, depression, HTN, GERD, COPD, pancytopenia, B12 deficiency who was last hospitalized in July 2021 for pancytopenia. She is presenting with weakness, dizziness, lightheadedness, CP radiating to left arm. No N/V/D or diaphoresis.  She also states that she fell sometime within the past week but cannot elaborate.  She states she was walking to the car and tripped and fell on her face. Last hospitalization her pancytopenia was attributed to alcohol abuse and B12 deficiency. She was going to get a BM biopsy but this was canceled as her presentation was considered 2/2 etoh abuse.  She was transfused as needed and treated with B12 supplementation during hospitalization and discharged on outpatient regimen.  She was strongly encouraged to quit drinking.  She was to follow up with hematology after discharge and did not do so.   Her daily lifestyle consists of being bedbound.  She only leaves her bed with a cane or walker to go to the bathroom and states she is unsteady on her feet when even doing so.  She showers approximately twice a week using a shower chair and her sons girlfriend assists her for the shower. She otherwise eats in bed and they bring her food. She drinks approximately a 12 pack of beer over 2 to 3 days.  She is down to approximately 2 to 3 cigarettes a day she says.  She denies illicit drug use. Last drink was on 11/28/2020, approximately 3 beers.  Notable labs on workup: Na 138, K 3.9, CO2 21, BUN 7, Creat 0.44. LFTs still in process from add-on WBC 3.4, Hgb 5, HCT 15.6, PLTC 13, MCV 120 Trop 59 Etoh <10, Tylenol negative, Salicylate negative PT 15.7, INR 1.3  CTH in ER negative for bleed or acute findings.  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  She appears  essentially depressed which is confirmed by her mostly just living in her bed and being brought food and neglecting hygiene.  She does not take any medications.  Mostly watches TV, drinks, smokes, and just lays there.

## 2020-11-29 NOTE — Progress Notes (Unsigned)
COURTESY NOTE;  54 year-old [name] woman known to our service (Dr Marin Olp) for about 20 years. With a history of aplastic anemia 2001 treated at [facility] with anti-thymocyte globulin and other immunoppressive agents and in remision since.   She has presented in the past with cytopenias and evaluation with bone marrow biopsy has been suggested, however there are two complicating factors, a possible history of B-12 deficiency and a history of ETOH abuse.  A B-12 deficient marrow will show megaloblasts which are morphologically indistinguishable from leukemic cells, and a hypocellular marrow can be hard to distinguish from aplasia. Accordingly the best strategy I believe is to correct nutritional deficiencies, abstain from alcohol for 14 days or more, and then proceed to bone marrow biopsy.  Unfortunately patient failed to keep appointments at Dr Antonieta Pert as outpatient. This means if she does not get a bone marrow biopsy this admission it may not get done.  I will alert Dr Marin Olp of [name]' readmission.

## 2020-11-29 NOTE — Assessment & Plan Note (Signed)
Nicotine patch ordered.

## 2020-11-29 NOTE — Assessment & Plan Note (Signed)
-  Not unsurprising given chronic alcohol use and laying in bed with no activity -RD consult, appreciate assistance

## 2020-11-29 NOTE — Assessment & Plan Note (Signed)
-   CP with radiation to L arm; no diaphoresis - Hgb 5 and likely demand; no recent echo but certainly at risk for alcoholic cardiomyopathy as well -will transfuse and treat supportively for now; not a good candidate for any invasive intervention nor anticoagulation either at this time - no asa either right now  - EKG with sinus tach (125), QTc 453; no obvious ischemia

## 2020-11-29 NOTE — Assessment & Plan Note (Signed)
-   see individual problems - seen by hematology last hospitalization; most likely all related to etoh use - will have hematology see patient while here given lost to followup and to make sure still no need for biopsy plus any further recommendations

## 2020-11-29 NOTE — ED Notes (Signed)
Blood transfusing started, verified by second nurse.will monitor for 15 mins. At bedside.

## 2020-11-29 NOTE — Assessment & Plan Note (Addendum)
-   PLTC 13k, no s/s bleeding (in July PLTC was 7-15k also) - transfuse if PLTC<10k or bleeding with PLTC<50k (I think trop elevation is demand not true ACS) - trending CBC - follow up hematology rec's regarding PLTC today (8k). She has no bleeding

## 2020-11-29 NOTE — Assessment & Plan Note (Signed)
-  BP elevated on admission and likely needs chronic medication -For now will treat with as needed hydralazine or labetalol

## 2020-11-29 NOTE — ED Notes (Signed)
No signs  Of reaction after  15 mins. See vital signs in flowsheet.

## 2020-11-29 NOTE — Assessment & Plan Note (Addendum)
-   Hgb 5, MCV 120 - hx of B12 deficiency (see separately) - 2 units PRBC ordered in ER; follow up Hgb after and transfuse further if Hgb<7 g/dL - watch for volume overload with blood and IVF being given; Albumin u/k (LFTs in process) - folate 9.2 and B12 176 this admission

## 2020-11-29 NOTE — ED Notes (Signed)
Initial contact with pt. Pt has blood infusing at the moment. On monitor x4. No complaints at the moment. Will continue to monitor.

## 2020-11-29 NOTE — Assessment & Plan Note (Addendum)
-  Fell approximately 1 week ago onto her face from mechanical fall -CT head and CT C-spine are negative for acute findings -No further work-up for now -Needs physical therapy as she is essentially bedbound at home except for going to the bathroom with a walker or cane

## 2020-11-29 NOTE — Assessment & Plan Note (Addendum)
-   12 pack every 2-3 days; last drink 11/28/20 - CIWA protocol; currently no s/s withdrawal at this time - banana bag then NS after - thiamine (high dose for now), folic, MVI -B1 level checked back in 2019 and was also low (30).  Repeat level now and continue thiamine supplementation, high-dose for now

## 2020-11-29 NOTE — ED Provider Notes (Addendum)
Wendy Edwards DEPT Provider Note   CSN: [id number] Arrival date & time: 11/29/20  1229     History Chief Complaint  Patient presents with  . Numbness    X week after fall    . Chest Pain    Wendy Edwards is a 54 y.o. female.  HPI 54 yo female with history aplastic anemia, anxiety, fall in July presents complaining of lue numbness and tingling.  She reports a fall a week ago and being seen in this ED for this- I cannot find a record of that event.  There is a visit in July of this year for a fall after which she was admitted for pancytopenia, tachycardia, and cirrhosis thought to most likely be secondary to etoh abuse. Patient states she has had chest pain for a week and a half (althought she also identifies this as having occurred since her fall).  The pain is in the center of her chest and radiates to the left.  IT is sharp and dull.  Worst is 10/10, currently 10.  Worsens with unknown, comes and goes, cannot tell what brings in on. She states it does not worsen with smoking.  Treated with nothing.  Lasts up to an hour.  Patient denies any physical exertion.  Denies cardiac history. Occasional sob, HPI: A 54 year old patient presents for evaluation of chest pain. Initial onset of pain was more than 6 hours ago. The patient's chest pain is described as heaviness/pressure/tightness and is not worse with exertion. The patient complains of nausea. The patient's chest pain is middle- or left-sided, is not well-localized, is not sharp and does radiate to the arms/jaw/neck. The patient denies diaphoresis. The patient has smoked in the past 90 days. The patient has no history of stroke, has no history of peripheral artery disease, denies any history of treated diabetes, has no relevant family history of coronary artery disease (first degree relative at less than age 31), is not hypertensive, has no history of hypercholesterolemia and does not have an elevated BMI (>=30).    Past Medical History:  Diagnosis Date  . 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 Aplastic anemia ([place])    hx/notes 05/09/2007  . Arthritis    "right knee" (02/21/2017)  . Asthma   . Chronic anxiety    Archie Endo 02/04/2016  . COPD (chronic obstructive pulmonary disease) ([place])    hx/notes 05/09/2007  . Daily headache   . Depression   . DVT (deep venous thrombosis) ([facility])    hx upper and lower extremeties/notes 05/09/2007  . GERD (gastroesophageal reflux disease)   . Goals of care, counseling/discussion 07/05/2020  . Heart murmur 1974  . History of blood transfusion    "related to my aplastic anemia"  . History of kidney stones   . Hypertension   . Migraine    "a few/year" (02/21/2017)  . Pneumonia 1974   hospitalized "w/double pneumonia"  . Serum sickness    hx/notes 05/09/2007  . Transaminitis    asymptomatic hx/notes 05/09/2007    Patient Active Problem List   Diagnosis Date Noted  . Goals of care, counseling/discussion 07/05/2020  . Cirrhosis of liver without ascites ([place])   . Pancytopenia ([place]) 06/26/2020  . Anxiety 06/26/2020  . GERD (gastroesophageal reflux disease) 06/26/2020  . Vitamin B12 deficiency 06/26/2020  . Pernicious anemia   . Thrombocytopenia ([place]) 06/25/2020  . Fall   . Aplastic anemia ([place])   . Altered  mental status 09/25/2018  . Metabolic acidosis 16/09/9603  . Malnutrition ([place]) 09/25/2018  . ETOH abuse 09/25/2018  . Sepsis ([place]) 09/25/2018  . COPD (chronic obstructive pulmonary disease) ([place]) 09/25/2018  . Chronic bilateral low back pain without sciatica 07/11/2018  . Screening for breast cancer 07/11/2018  . 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 Pain due to knee joint prosthesis ([place]), right 02/16/2017  . Hx of aplastic anemia 10/31/2012  . HTN (hypertension) 06/19/2012  . Tobacco abuse 05/18/2012    Past Surgical History:  Procedure Laterality Date  .  CYSTOSCOPY W/ URETEROSCOPY W/ LITHOTRIPSY    . EXTREMITY CYST EXCISION Left 2017   upper, inner arm  . KNEE ARTHROSCOPY Right 1985  . MEDIAL PARTIAL KNEE REPLACEMENT Right 2005  . TOTAL ABDOMINAL HYSTERECTOMY  2003   hx/notes 05/09/2007  . TOTAL KNEE REVISION Right 02/20/2017   Procedure: TOTAL KNEE REVISION;  Surgeon: Frederik Pear, MD;  Location: [facility];  Service: Orthopedics;  Laterality: Right;  . TUBAL LIGATION    . TUNNELED VENOUS CATHETER PLACEMENT Right    hx/notes 05/09/2007; "took it out ~ 1 wk later"     OB History   No obstetric history on file.     Family History  Problem Relation Age of Onset  . Coronary artery disease Father 50       CABG  . Cancer Father   . Hypertension Mother   . Cancer Mother     Social History   Tobacco Use  . Smoking status: Current Every Day Smoker    Packs/day: 0.50    Years: 34.00    Pack years: 17.00    Types: Cigarettes  . Smokeless tobacco: Never Used  Vaping Use  . Vaping Use: Some days  Substance Use Topics  . Alcohol use: Yes    Alcohol/week: 12.0 standard drinks    Types: 12 Cans of beer per week    Comment: previously a heavy drinker - 6-7 beers daily; last drank maybe a week ago  . Drug use: No    Home Medications Prior to Admission medications   Medication Sig Start Date End Date Taking? Authorizing Provider  acetaminophen (TYLENOL) 500 MG tablet Take 1,000 mg by mouth every 6 (six) hours as needed for mild pain.    [provider]  albuterol (ACCUNEB) 1.25 MG/3ML nebulizer solution TAKE 3 ML(1 VIAL) BY NEBULIZATION 3 TIMES DAILY AS NEEDED FOR WHEEZING. Patient not taking: Reported on 06/25/2020 10/01/18   [place], Holli Humbles, NP  ALPRAZolam Duanne Moron) 1 MG tablet TAKE 1 TABLET BY MOUTH THREE TIMES A DAY AS NEEDED 07/05/20   [name], MD  cyanocobalamin (,VITAMIN B-12,) 1000 MCG/ML injection Inject 1 mL (1,000 mcg total) into the muscle daily. Take 1021mcg IM daily x 3 days, then weekly x 1 month, then  monthly. 07/01/20   Eugenie Filler, MD  folic acid (FOLVITE) 1 MG tablet Take 1 tablet (1 mg total) by mouth daily. 07/01/20   Eugenie Filler, MD  mometasone-formoterol (DULERA) 100-5 MCG/ACT AERO Inhale 2 puffs into the lungs 2 (two) times daily. 06/30/20   Eugenie Filler, MD  Multiple Vitamin (MULTIVITAMIN WITH MINERALS) TABS tablet Take 1 tablet by mouth daily. 07/01/20   Eugenie Filler, MD  pantoprazole (PROTONIX) 40 MG tablet Take 1 tablet (40 mg total) by mouth daily at 6 (six) AM. 07/01/20   Eugenie Filler, MD  PROAIR HFA 108 [phone] Base) MCG/ACT inhaler INHALE 2 PUFFS BY MOUTH  EVERY 6 HOURS AS NEEDED Patient not taking: INHALE 2 PUFFS BY MOUTH EVERY 6 HOURS AS NEEDED 12/24/18   [place], Holli Humbles, NP  SYRINGE/NEEDLE, DISP, 1 ML (B-D SYRINGE/NEEDLE 1CC/25GX5/8) 25G X 5/8" 1 ML MISC 1,000 mcg by Does not apply route daily. 1cc BD syringe for vitamin B12 shots. 06/30/20   Eugenie Filler, MD  thiamine 100 MG tablet Take 1 tablet (100 mg total) by mouth daily. 07/01/20   Eugenie Filler, MD  umeclidinium bromide (INCRUSE ELLIPTA) 62.5 MCG/INH AEPB Inhale 1 puff into the lungs daily. 07/01/20   Eugenie Filler, MD    Allergies    Bee venom and Phenergan [promethazine]  Review of Systems   Review of Systems  Physical Exam Updated Vital Signs BP (!) 147/78 (BP Location: Left Arm)   Pulse (!) 134   Temp 98.4 F (36.9 C) (Oral)   Resp (!) 21   SpO2 100%   Physical Exam Vitals reviewed.  Constitutional:      Appearance: She is well-developed.  HENT:     Head: Normocephalic.  Eyes:     Pupils: Pupils are equal, round, and reactive to light.  Cardiovascular:     Rate and Rhythm: Normal rate and regular rhythm.     Heart sounds: Normal heart sounds.  Pulmonary:     Effort: Pulmonary effort is normal.     Breath sounds: Normal breath sounds.  Abdominal:     General: Bowel sounds are normal.     Palpations: Abdomen is soft.  Musculoskeletal:        General:  Normal range of motion.     Cervical back: Normal range of motion.     Comments: Some ttp left neck and upper back  Skin:    General: Skin is warm and dry.     Capillary Refill: Capillary refill takes less than 2 seconds.  Neurological:     Mental Status: She is alert.     Comments: Intrinsic muscles of hand with decreased strength bilaterally     ED Results / Procedures / Treatments   Labs (all labs ordered are listed, but only abnormal results are displayed) Labs Reviewed  BASIC METABOLIC PANEL  CBC  TROPONIN I (HIGH SENSITIVITY)    EKG EKG Interpretation  Date/Time:  Sunday November 29 2020 12:45:25 EST Ventricular Rate:  125 PR Interval:    QRS Duration: 121 QT Interval:  314 QTC Calculation: 453 R Axis:   66 Text Interpretation: Sinus tachycardia IVCD, consider atypical RBBB 12 Lead; [place] No significant change was found Confirmed by Ezequiel Essex [id number]) on 11/29/2020 1:06:17 PM   Radiology No results found.  Procedures Procedures (including critical care time)  Medications Ordered in ED Medications - No data to display  ED Course  I have reviewed the triage vital signs and the nursing notes.  Pertinent labs & imaging results that were available during my care of the patient were reviewed by me and considered in my medical decision making (see chart for details).  Clinical Course as of 11/29/20 1524  Sun Nov 29, 2020  1449 Critical hgb of 5 and platelets of 13 Patient with prior ho pancytopenia thought secondary to b12 deficiency on hospitalization July 2021.  Plan tx 2 units prbc [DR]    Clinical Course User Index [DR] Pattricia Boss, MD   MDM Rules/Calculators/A&P HEAR Score: 5  1- chest pain- no acute st segment changes noted.  Pain could be cardiac but would be exacerbated by severe anemia.  First troponin elevated at 59.  Will need to trend and evaluate effect of transfusion on chest pain.  2- anemia- patient with ho  aplastic anemia last summer.  Seen in hospital and no blood marrow obtained as thought to be due to b12 deficiency and improved in hospital.  However, patient lost to follow up and unclear if she has been followed since Here, Hgb 5 and 2 units ordered for transfusion Platelets 13- no active bleeding noted, no indication for platelet transfusion at this time White blood cell count 3400 3 neck and arm pain-patient reports neck and arm pain with hand weakness since the fall.  Patient reports fall as well and half week ago but seems confused regarding the timeframe.  CT was obtained here shows no evidence of acute fracture or definitive disease.  She does appear to have intrinsic weakness of her hands bilaterally.  She may need further evaluation with MRI. Final Clinical Impression(s) / ED Diagnoses Final diagnoses:  None    Rx / DC Orders ED Discharge Orders    None       Pattricia Boss, MD 11/29/20 2210    Pattricia Boss, MD 12/17/20 1515

## 2020-11-29 NOTE — Assessment & Plan Note (Addendum)
-  Expect her to have some level of remaining pancytopenia after transfusions -Etiology considered chronic alcohol use.  Again cessation was strongly encouraged MELD-Na score: 9 at 11/30/2020  6:12 AM MELD score: 9 at 11/30/2020  6:12 AM Calculated from: Serum Creatinine: 0.38 mg/dL (Using min of 1 mg/dL) at 11/30/2020  6:12 AM Serum Sodium: 136 mmol/L at 11/30/2020  6:12 AM Total Bilirubin: 0.9 mg/dL (Using min of 1 mg/dL) at 11/30/2020  6:12 AM INR(ratio): 1.3 at 11/29/2020  3:00 PM Age: 54 years

## 2020-11-29 NOTE — Assessment & Plan Note (Signed)
-  Continue twice daily PPI

## 2020-11-29 NOTE — ED Notes (Signed)
Pt transported to CT ?

## 2020-11-29 NOTE — Assessment & Plan Note (Addendum)
-   B12 was 83 in July 2021; and she is still drinking and not taking any medications so expect this to still be low -Repeat B12 level now = 176 (still low) -Continue daily supplementation and multivitamin

## 2020-11-29 NOTE — H&P (Signed)
History and Physical    LORAINE BHULLAR  ALP:[id number]  DOB: 1966/05/25  DOA: 11/29/2020  PCP: Patient, No Pcp Per Patient coming from: home  Chief Complaint: weakness, CP, dizziness  HPI:  Ms. Besecker is a 54 yo female with PMH chronic alcohol abuse, cirrhosis w/o ascites, tobacco use, depression, HTN, GERD, COPD, pancytopenia, B12 deficiency who was last hospitalized in July 2021 for pancytopenia. She is presenting with weakness, dizziness, lightheadedness, CP radiating to left arm. No N/V/D or diaphoresis.  She also states that she fell sometime within the past week but cannot elaborate.  She states she was walking to the car and tripped and fell on her face. Last hospitalization her pancytopenia was attributed to alcohol abuse and B12 deficiency. She was going to get a BM biopsy but this was canceled as her presentation was considered 2/2 etoh abuse.  She was transfused as needed and treated with B12 supplementation during hospitalization and discharged on outpatient regimen.  She was strongly encouraged to quit drinking.  She was to follow up with hematology after discharge and did not do so.   Her daily lifestyle consists of being bedbound.  She only leaves her bed with a cane or walker to go to the bathroom and states she is unsteady on her feet when even doing so.  She showers approximately twice a week using a shower chair and her sons girlfriend assists her for the shower. She otherwise eats in bed and they bring her food. She drinks approximately a 12 pack of beer over 2 to 3 days.  She is down to approximately 2 to 3 cigarettes a day she says.  She denies illicit drug use. Last drink was on 11/28/2020, approximately 3 beers.  Notable labs on workup: Na 138, K 3.9, CO2 21, BUN 7, Creat 0.44. LFTs still in process from add-on WBC 3.4, Hgb 5, HCT 15.6, PLTC 13, MCV 120 Trop 59 Etoh <10, Tylenol negative, Salicylate negative PT 15.7, INR 1.3  CTH in ER negative for bleed or  acute findings.  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  She appears essentially depressed which is confirmed by her mostly just living in her bed and being brought food and neglecting hygiene.  She does not take any medications.  Mostly watches TV, drinks, smokes, and just lays there.    I have personally briefly reviewed patient's old medical records in [facility] and discussed patient with the ER provider when appropriate/indicated.  Assessment/Plan: * Pancytopenia ([place]) - see individual problems - seen by hematology last hospitalization; most likely all related to etoh use - will have hematology see patient while here given lost to followup and to make sure still no need for biopsy plus any further recommendations   Macrocytic anemia - Hgb 5, MCV 120 - hx of B12 deficiency (see separately) - 2 units PRBC ordered in ER; follow up Hgb after and transfuse further if Hgb<7 g/dL - watch for volume overload with blood and IVF being given; Albumin u/k (LFTs in process) - follow up folate and other pending labs  Thrombocytopenia (HCC) - PLTC 13k, no s/s bleeding (in July PLTC was 7-15k also) - transfuse if PLTC<10k or bleeding with PLTC<50k (I think trop elevation is demand not true ACS) - trending CBC  Vitamin B12 deficiency - B12 was 83 in July 2021; and she is still drinking and  not taking any medications so expect this to still be low -Repeat B12 level now -Continue daily supplementation and multivitamin  ETOH abuse - 12 pack every 2-3 days; last drink 11/28/20 - CIWA protocol - banana bag then NS after - thiamine (high dose for now), folic, MVI -B1 level checked back in 2019 and was also low (30).  Repeat level now and continue thiamine supplementation, high-dose for now  Elevated troponin - CP with radiation to L arm; no diaphoresis -  Hgb 5 and likely demand; no recent echo but certainly at risk for alcoholic cardiomyopathy as well -will transfuse and treat supportively for now; not a good candidate for any invasive intervention nor anticoagulation either at this time - no asa either right now  - EKG with sinus tach (125), QTc 453; no obvious ischemia  Cirrhosis of liver without ascites (HCC) -Expect her to have some level of remaining pancytopenia after transfusions -Etiology considered chronic alcohol use.  Again cessation was strongly encouraged -Still awaiting LFTs to calculate MELD-Na score  GERD (gastroesophageal reflux disease) -Continue twice daily PPI  Fall -Fell approximately 1 week ago onto her face from mechanical fall -CT head and CT C-spine are negative for acute findings -No further work-up for now -Needs physical therapy as she is essentially bedbound at home except for going to the bathroom with a walker or cane  COPD (chronic obstructive pulmonary disease) (HCC) - no s/s exacerbation  Malnutrition ([place]) -Not unsurprising given chronic alcohol use and laying in bed with no activity -RD consult, appreciate assistance  HTN (hypertension) -BP elevated on admission and likely needs chronic medication -For now will treat with as needed hydralazine or labetalol  Tobacco abuse -Nicotine patch ordered     Code Status: Full DVT Prophylaxis: SCD Anticipated disposition is to: home or SNF  History: Past Medical History:  Diagnosis Date  . 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 Aplastic anemia ([place])    hx/notes 05/09/2007  . Arthritis    "right knee" (02/21/2017)  . Asthma   . Chronic anxiety    Archie Endo 02/04/2016  . COPD (chronic obstructive pulmonary disease) ([place])    hx/notes 05/09/2007  . Daily headache   . Depression   . DVT (deep venous thrombosis) (HCC)    hx upper and lower extremeties/notes 05/09/2007  .  GERD (gastroesophageal reflux disease)   . Goals of care, counseling/discussion 07/05/2020  . Heart murmur 1974  . History of blood transfusion    "related to my aplastic anemia"  . History of kidney stones   . Hypertension   . Migraine    "a few/year" (02/21/2017)  . Pneumonia 1974   hospitalized "w/double pneumonia"  . Serum sickness    hx/notes 05/09/2007  . Transaminitis    asymptomatic hx/notes 05/09/2007    Past Surgical History:  Procedure Laterality Date  . CYSTOSCOPY W/ URETEROSCOPY W/ LITHOTRIPSY    . EXTREMITY CYST EXCISION Left 2017   upper, inner arm  . KNEE ARTHROSCOPY Right 1985  . MEDIAL PARTIAL KNEE REPLACEMENT Right 2005  . TOTAL ABDOMINAL HYSTERECTOMY  2003   hx/notes 05/09/2007  . TOTAL KNEE REVISION Right 02/20/2017   Procedure: TOTAL KNEE REVISION;  Surgeon: Frederik Pear, MD;  Location: [facility];  Service: Orthopedics;  Laterality: Right;  . TUBAL LIGATION    . TUNNELED VENOUS CATHETER PLACEMENT Right    hx/notes 05/09/2007; "took it out ~ 1 wk later"  reports that she has been smoking cigarettes. She has a 17.00 pack-year smoking history. She has never used smokeless tobacco. She reports current alcohol use of about 12.0 standard drinks of alcohol per week. She reports that she does not use drugs.  Allergies  Allergen Reactions  . Bee Venom Hives  . Phenergan [Promethazine] Other (See Comments)    "made me shaky"    Family History  Problem Relation Age of Onset  . Coronary artery disease Father 70       CABG  . Cancer Father   . Hypertension Mother   . Cancer Mother    Home Medications: Prior to Admission medications   Medication Sig Start Date End Date Taking? Authorizing Provider  acetaminophen (TYLENOL) 500 MG tablet Take 1,000 mg by mouth every 6 (six) hours as needed for mild pain.    [provider]  albuterol (ACCUNEB) 1.25 MG/3ML nebulizer solution TAKE 3 ML(1 VIAL) BY NEBULIZATION 3 TIMES DAILY AS NEEDED FOR WHEEZING. Patient not  taking: Reported on 06/25/2020 10/01/18   [place], Holli Humbles, NP  ALPRAZolam Duanne Moron) 1 MG tablet TAKE 1 TABLET BY MOUTH THREE TIMES A DAY AS NEEDED 07/05/20   [name], MD  cyanocobalamin (,VITAMIN B-12,) 1000 MCG/ML injection Inject 1 mL (1,000 mcg total) into the muscle daily. Take 1051mcg IM daily x 3 days, then weekly x 1 month, then monthly. 07/01/20   Eugenie Filler, MD  folic acid (FOLVITE) 1 MG tablet Take 1 tablet (1 mg total) by mouth daily. 07/01/20   Eugenie Filler, MD  mometasone-formoterol (DULERA) 100-5 MCG/ACT AERO Inhale 2 puffs into the lungs 2 (two) times daily. 06/30/20   Eugenie Filler, MD  Multiple Vitamin (MULTIVITAMIN WITH MINERALS) TABS tablet Take 1 tablet by mouth daily. 07/01/20   Eugenie Filler, MD  pantoprazole (PROTONIX) 40 MG tablet Take 1 tablet (40 mg total) by mouth daily at 6 (six) AM. 07/01/20   Eugenie Filler, MD  PROAIR HFA 108 [phone] Base) MCG/ACT inhaler INHALE 2 PUFFS BY MOUTH EVERY 6 HOURS AS NEEDED Patient not taking: INHALE 2 PUFFS BY MOUTH EVERY 6 HOURS AS NEEDED 12/24/18   [place], Holli Humbles, NP  SYRINGE/NEEDLE, DISP, 1 ML (B-D SYRINGE/NEEDLE 1CC/25GX5/8) 25G X 5/8" 1 ML MISC 1,000 mcg by Does not apply route daily. 1cc BD syringe for vitamin B12 shots. 06/30/20   Eugenie Filler, MD  thiamine 100 MG tablet Take 1 tablet (100 mg total) by mouth daily. 07/01/20   Eugenie Filler, MD  umeclidinium bromide (INCRUSE ELLIPTA) 62.5 MCG/INH AEPB Inhale 1 puff into the lungs daily. 07/01/20   Eugenie Filler, MD    Review of Systems:  Pertinent items noted in HPI and remainder of comprehensive ROS otherwise negative.  Physical Exam: Vitals:   11/29/20 1600 11/29/20 1615 11/29/20 1630 11/29/20 1656  BP: (!) 145/66  (!) 169/86 (!) 154/71  Pulse: (!) 102 (!) 111 (!) 101 100  Resp: 15 13 (!) 22 19  Temp:    98.4 F (36.9 C)  TempSrc:    Oral  SpO2: 98% 98% 98%    General appearance: Disheveled and unkempt adult woman appearing  older than actual age laying in bed sitting up and laying back because feels like the room is almost spinning, also starting to see things Head: Normocephalic, without obvious abnormality, atraumatic Eyes: EOMI Lungs: clear to auscultation bilaterally Heart: regular rate and rhythm and S1, S2 normal Abdomen: normal findings: bowel sounds normal and  soft, non-tender Extremities: No edema.  Fingernails are untrimmed and long and covered in dirt Skin: Covered in dirt throughout and unclean Neurologic: Globally weak.  Oriented to name, day, hospital.  Not oriented to year (2020) and did not know the president  Labs on Admission:  I have personally reviewed following labs and imaging studies Results for orders placed or performed during the hospital encounter of 11/29/20 (from the past 24 hour(s))  Basic metabolic panel     Status: Abnormal   Collection Time: 11/29/20  1:15 PM  Result Value Ref Range   Sodium 138 135 - 145 mmol/L   Potassium 3.9 3.5 - 5.1 mmol/L   Chloride 104 98 - 111 mmol/L   CO2 21 (L) 22 - 32 mmol/L   Glucose, Bld 109 (H) 70 - 99 mg/dL   BUN 7 6 - 20 mg/dL   Creatinine, Ser 0.44 0.44 - 1.00 mg/dL   Calcium 8.3 (L) 8.9 - 10.3 mg/dL   GFR, Estimated >60 >60 mL/min   Anion gap 13 5 - 15  CBC     Status: Abnormal   Collection Time: 11/29/20  1:15 PM  Result Value Ref Range   WBC 3.4 (L) 4.0 - 10.5 K/uL   RBC 1.30 (L) 3.87 - 5.11 MIL/uL   Hemoglobin 5.0 (LL) 12.0 - 15.0 g/dL   HCT 15.6 (L) 36.0 - 46.0 %   MCV 120.0 (H) 80.0 - 100.0 fL   MCH 38.5 (H) 26.0 - 34.0 pg   MCHC 32.1 30.0 - 36.0 g/dL   RDW 15.1 11.5 - 15.5 %   Platelets 13 (LL) 150 - 400 K/uL   nRBC 1.7 (H) 0.0 - 0.2 %  Troponin I (High Sensitivity)     Status: Abnormal   Collection Time: 11/29/20  1:15 PM  Result Value Ref Range   Troponin I (High Sensitivity) 59 (H) <18 ng/L  Ethanol     Status: None   Collection Time: 11/29/20  1:15 PM  Result Value Ref Range   Alcohol, Ethyl (B) <10 <10 mg/dL   Acetaminophen level     Status: Abnormal   Collection Time: 11/29/20  1:15 PM  Result Value Ref Range   Acetaminophen (Tylenol), Serum <10 (L) 10 - 30 ug/mL  Salicylate level     Status: Abnormal   Collection Time: 11/29/20  1:15 PM  Result Value Ref Range   Salicylate Lvl <0.2 (L) 7.0 - 30.0 mg/dL  Type and screen [place]     Status: None (Preliminary result)   Collection Time: 11/29/20  3:00 PM  Result Value Ref Range   ABO/RH(D) O POS    Antibody Screen NEG    Sample Expiration 12/02/2020,2359    Unit Number [id number]    Blood Component Type RED CELLS,LR    Unit division 00    Status of Unit ISSUED    Transfusion Status OK TO TRANSFUSE    Crossmatch Result      Compatible Performed at [facility], [place] [address], [place], [place] [address]    Unit Number [id number]    Blood Component Type RED CELLS,LR    Unit division 00    Status of Unit ALLOCATED    Transfusion Status OK TO TRANSFUSE    Crossmatch Result Compatible   Protime-INR     Status: Abnormal   Collection Time: 11/29/20  3:00 PM  Result Value Ref Range   Prothrombin Time 15.7 (H) 11.4 - 15.2 seconds   INR  1.3 (H) 0.8 - 1.2  Prepare RBC (crossmatch)     Status: None   Collection Time: 11/29/20  3:00 PM  Result Value Ref Range   Order Confirmation      ORDER PROCESSED BY BLOOD BANK Performed at [facility], [place] [address] [place], [place] [address]   Lactate dehydrogenase     Status: Abnormal   Collection Time: 11/29/20  4:00 PM  Result Value Ref Range   LDH 214 (H) 98 - 192 U/L  Fibrinogen     Status: None   Collection Time: 11/29/20  4:00 PM  Result Value Ref Range   Fibrinogen 289 210 - 475 mg/dL  Reticulocytes     Status: Abnormal   Collection Time: 11/29/20  4:00 PM  Result Value Ref Range   Retic Ct Pct 2.5 0.4 - 3.1 %   RBC. 1.14 (L) 3.87 - 5.11 MIL/uL   Retic Count, Absolute 28.0 19.0 - 186.0 K/uL   Immature Retic Fract  16.0 (H) 2.3 - 15.9 %     Radiological Exams on Admission: CT Head Wo Contrast  Result Date: 11/29/2020 CLINICAL DATA:  Pt reports she fell in her parking lot week or so ago and was seen here. Reports left arm numbness since fall. EXAM: CT HEAD WITHOUT CONTRAST CT CERVICAL SPINE WITHOUT CONTRAST TECHNIQUE: Multidetector CT imaging of the head and cervical spine was performed following the standard protocol without intravenous contrast. Multiplanar CT image reconstructions of the cervical spine were also generated. COMPARISON:  CT head and neck 06/25/2020 FINDINGS: CT HEAD FINDINGS Brain: No evidence of acute infarction, hemorrhage, hydrocephalus, extra-axial collection or mass lesion/mass effect. 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 The Vascular: No hyperdense vessel or unexpected calcification. Skull: Normal. Negative for fracture or focal lesion. Sinuses/Orbits: No acute finding. Other: None. CT CERVICAL SPINE FINDINGS Alignment: Normal. Skull base and vertebrae: No acute fracture. No primary bone lesion or focal pathologic process. Soft tissues and spinal canal: No prevertebral fluid or swelling. No visible canal hematoma. Disc levels:  No impingement identified. Upper chest: Negative. Other: None. IMPRESSION: 1. No acute intracranial process. 2. 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 3. No acute fracture or subluxation of the cervical spine. Electronically Signed   By: Audie Pinto M.D.   On: 11/29/2020 14:34   CT Cervical Spine Wo Contrast  Result Date: 11/29/2020 CLINICAL DATA:  Pt reports she fell in her parking lot week or so ago and was seen here. Reports left arm numbness since fall. EXAM: CT HEAD WITHOUT CONTRAST CT CERVICAL SPINE WITHOUT CONTRAST TECHNIQUE: Multidetector CT imaging of the head and cervical spine was performed following the standard  protocol without intravenous contrast. Multiplanar CT image reconstructions of the cervical spine were also generated. COMPARISON:  CT head and neck 06/25/2020 FINDINGS: CT HEAD FINDINGS Brain: No evidence of acute infarction, hemorrhage, hydrocephalus, extra-axial collection or mass lesion/mass effect. 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 The Vascular: No hyperdense vessel or unexpected calcification. Skull: Normal. Negative for fracture or focal lesion. Sinuses/Orbits: No acute finding. Other: None. CT CERVICAL SPINE FINDINGS Alignment: Normal. Skull base and vertebrae: No acute fracture. No primary bone lesion or focal pathologic process. Soft tissues and spinal canal: No prevertebral fluid or swelling. No visible canal hematoma. Disc levels:  No impingement identified. Upper chest: Negative. Other: None. IMPRESSION: 1. No acute intracranial  process. 2. 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 3. No acute fracture or subluxation of the cervical spine. Electronically Signed   By: Audie Pinto M.D.   On: 11/29/2020 14:34   DG Chest Port 1 View  Result Date: 11/29/2020 CLINICAL DATA:  Chest pain, fall EXAM: PORTABLE CHEST 1 VIEW COMPARISON:  08/02/2019 FINDINGS: The heart size and mediastinal contours are stable. Atherosclerotic calcification of the aortic knob. Both lungs are clear. The visualized skeletal structures are unremarkable. IMPRESSION: No active disease. Electronically Signed   By: Davina Poke D.O.   On: 11/29/2020 14:39   DG Chest [facility]  Final Result    CT Head Wo Contrast  Final Result    CT Cervical Spine Wo Contrast  Final Result      Consults called:  Hematology   EKG: Independently reviewed. ST 125, QTc 453. No obvious ischemia   Dwyane Dee, MD [facility] 11/29/2020, 5:02 PM

## 2020-11-29 NOTE — ED Triage Notes (Signed)
Pt reports she fell in her parking lot week or so ago and was seen here. Reports left arm numbness since fall.  For past couple days ago having left-center chest pains that has been constant.

## 2020-11-29 NOTE — Assessment & Plan Note (Signed)
-   no s/s exacerbation 

## 2020-11-30 ENCOUNTER — Encounter (HOSPITAL_COMMUNITY): Payer: Self-pay | Admitting: Internal Medicine

## 2020-11-30 ENCOUNTER — Inpatient Hospital Stay (HOSPITAL_COMMUNITY): Payer: Medicare Other

## 2020-11-30 DIAGNOSIS — M79642 Pain in left hand: Secondary | ICD-10-CM

## 2020-11-30 LAB — TYPE AND SCREEN
ABO/RH(D): O POS
Antibody Screen: NEGATIVE
Unit division: 0
Unit division: 0

## 2020-11-30 LAB — CBC WITH DIFFERENTIAL/PLATELET
Abs Immature Granulocytes: 0.01 10*3/uL (ref 0.00–0.07)
Basophils Absolute: 0 10*3/uL (ref 0.0–0.1)
Basophils Relative: 1 %
Eosinophils Absolute: 0 10*3/uL (ref 0.0–0.5)
Eosinophils Relative: 0 %
HCT: 24 % — ABNORMAL LOW (ref 36.0–46.0)
Hemoglobin: 8 g/dL — ABNORMAL LOW (ref 12.0–15.0)
Immature Granulocytes: 0 %
Lymphocytes Relative: 36 %
Lymphs Abs: 1.1 10*3/uL (ref 0.7–4.0)
MCH: 32.9 pg (ref 26.0–34.0)
MCHC: 33.3 g/dL (ref 30.0–36.0)
MCV: 98.8 fL (ref 80.0–100.0)
Monocytes Absolute: 1.3 10*3/uL — ABNORMAL HIGH (ref 0.1–1.0)
Monocytes Relative: 44 %
Neutro Abs: 0.6 10*3/uL — ABNORMAL LOW (ref 1.7–7.7)
Neutrophils Relative %: 19 %
Platelets: 8 10*3/uL — CL (ref 150–400)
RBC: 2.43 MIL/uL — ABNORMAL LOW (ref 3.87–5.11)
RDW: 24.5 % — ABNORMAL HIGH (ref 11.5–15.5)
WBC: 2.9 10*3/uL — ABNORMAL LOW (ref 4.0–10.5)
nRBC: 1.4 % — ABNORMAL HIGH (ref 0.0–0.2)

## 2020-11-30 LAB — COMPREHENSIVE METABOLIC PANEL
ALT: 19 U/L (ref 0–44)
AST: 25 U/L (ref 15–41)
Albumin: 2.9 g/dL — ABNORMAL LOW (ref 3.5–5.0)
Alkaline Phosphatase: 60 U/L (ref 38–126)
Anion gap: 10 (ref 5–15)
BUN: 6 mg/dL (ref 6–20)
CO2: 18 mmol/L — ABNORMAL LOW (ref 22–32)
Calcium: 6.9 mg/dL — ABNORMAL LOW (ref 8.9–10.3)
Chloride: 108 mmol/L (ref 98–111)
Creatinine, Ser: 0.38 mg/dL — ABNORMAL LOW (ref 0.44–1.00)
GFR, Estimated: >60 mL/min (ref >60)
Glucose, Bld: 74 mg/dL (ref 70–99)
Potassium: 3 mmol/L — ABNORMAL LOW (ref 3.5–5.1)
Sodium: 136 mmol/L (ref 135–145)
Total Bilirubin: 0.9 mg/dL (ref 0.3–1.2)
Total Protein: 5.7 g/dL — ABNORMAL LOW (ref 6.5–8.1)

## 2020-11-30 LAB — BPAM RBC
Blood Product Expiration Date: 202201112359
Blood Product Expiration Date: 202201112359
ISSUE DATE / TIME: 202112121649
ISSUE DATE / TIME: 202112122048
Unit Type and Rh: 5100
Unit Type and Rh: 5100

## 2020-11-30 LAB — PATHOLOGIST SMEAR REVIEW

## 2020-11-30 LAB — HEMOGLOBIN AND HEMATOCRIT, BLOOD
HCT: 25.4 % — ABNORMAL LOW (ref 36.0–46.0)
Hemoglobin: 8.4 g/dL — ABNORMAL LOW (ref 12.0–15.0)

## 2020-11-30 LAB — MAGNESIUM: Magnesium: 1.7 mg/dL (ref 1.7–2.4)

## 2020-11-30 LAB — PHOSPHORUS: Phosphorus: 2.8 mg/dL (ref 2.5–4.6)

## 2020-11-30 LAB — TSH: TSH: 1.332 u[IU]/mL (ref 0.350–4.500)

## 2020-11-30 MED ORDER — PANTOPRAZOLE SODIUM 40 MG PO TBEC
40.0000 mg | DELAYED_RELEASE_TABLET | Freq: Two times a day (BID) | ORAL | Status: DC
  Administered 2020-11-30 – 2020-12-01 (×3): 40 mg via ORAL
  Filled 2020-11-30 (×4): qty 1

## 2020-11-30 MED ORDER — ADULT MULTIVITAMIN W/MINERALS CH
1.0000 | ORAL_TABLET | Freq: Every day | ORAL | Status: DC
  Administered 2020-11-30 – 2020-12-02 (×2): 1 via ORAL
  Filled 2020-11-30 (×2): qty 1

## 2020-11-30 MED ORDER — LABETALOL HCL 5 MG/ML IV SOLN: 10.0000 mg | INTRAVENOUS | Status: DC | PRN

## 2020-11-30 MED ORDER — THIAMINE HCL 100 MG/ML IJ SOLN
Freq: Once | INTRAVENOUS | Status: AC
  Filled 2020-11-30: qty 1000

## 2020-11-30 MED ORDER — ACETAMINOPHEN 325 MG PO TABS
650.0000 mg | ORAL_TABLET | Freq: Four times a day (QID) | ORAL | Status: DC | PRN
  Administered 2020-11-30 – 2020-12-02 (×7): 650 mg via ORAL
  Filled 2020-11-30 (×7): qty 2

## 2020-11-30 MED ORDER — MAGNESIUM OXIDE 400 (241.3 MG) MG PO TABS
800.0000 mg | ORAL_TABLET | Freq: Once | ORAL | Status: AC
  Administered 2020-11-30: 09:00:00 800 mg via ORAL
  Filled 2020-11-30: qty 2

## 2020-11-30 MED ORDER — SODIUM CHLORIDE 0.9 % IV SOLN: INTRAVENOUS | Status: DC

## 2020-11-30 MED ORDER — LORAZEPAM 1 MG PO TABS: 1.0000 mg | ORAL_TABLET | ORAL | Status: DC | PRN

## 2020-11-30 MED ORDER — ONDANSETRON HCL 4 MG PO TABS: 4.0000 mg | ORAL_TABLET | Freq: Four times a day (QID) | ORAL | Status: DC | PRN

## 2020-11-30 MED ORDER — CYANOCOBALAMIN 1000 MCG/ML IJ SOLN
1000.0000 ug | Freq: Every day | INTRAMUSCULAR | Status: DC
  Administered 2020-11-30: 14:00:00 1000 ug via SUBCUTANEOUS
  Filled 2020-11-30 (×3): qty 1

## 2020-11-30 MED ORDER — POTASSIUM CHLORIDE CRYS ER 20 MEQ PO TBCR
40.0000 meq | EXTENDED_RELEASE_TABLET | ORAL | Status: AC
  Administered 2020-11-30 (×2): 40 meq via ORAL
  Filled 2020-11-30 (×2): qty 2

## 2020-11-30 MED ORDER — THIAMINE HCL 100 MG/ML IJ SOLN: 250.0000 mg | Freq: Every day | INTRAVENOUS | Status: DC

## 2020-11-30 MED ORDER — NICOTINE 21 MG/24HR TD PT24
21.0000 mg | MEDICATED_PATCH | Freq: Every day | TRANSDERMAL | Status: DC
  Administered 2020-11-30 – 2020-12-02 (×3): 21 mg via TRANSDERMAL
  Filled 2020-11-30 (×4): qty 1

## 2020-11-30 MED ORDER — LORAZEPAM 2 MG/ML IJ SOLN: 1.0000 mg | INTRAMUSCULAR | Status: DC | PRN

## 2020-11-30 MED ORDER — THIAMINE HCL 100 MG/ML IJ SOLN
500.0000 mg | Freq: Three times a day (TID) | INTRAVENOUS | Status: DC
  Administered 2020-11-30 – 2020-12-02 (×7): 500 mg via INTRAVENOUS
  Filled 2020-11-30 (×8): qty 5

## 2020-11-30 MED ORDER — SODIUM CHLORIDE 0.9% FLUSH
3.0000 mL | Freq: Two times a day (BID) | INTRAVENOUS | Status: DC
  Administered 2020-11-30 – 2020-12-02 (×3): 3 mL via INTRAVENOUS

## 2020-11-30 MED ORDER — HYDRALAZINE HCL 25 MG PO TABS: 25.0000 mg | ORAL_TABLET | ORAL | Status: DC | PRN

## 2020-11-30 MED ORDER — ONDANSETRON HCL 4 MG/2ML IJ SOLN: 4.0000 mg | Freq: Four times a day (QID) | INTRAMUSCULAR | Status: DC | PRN

## 2020-11-30 MED ORDER — FOLIC ACID 1 MG PO TABS
1.0000 mg | ORAL_TABLET | Freq: Every day | ORAL | Status: DC
  Administered 2020-11-30 – 2020-12-02 (×2): 1 mg via ORAL
  Filled 2020-11-30 (×2): qty 1

## 2020-11-30 MED ORDER — SENNOSIDES-DOCUSATE SODIUM 8.6-50 MG PO TABS
1.0000 | ORAL_TABLET | Freq: Every day | ORAL | Status: DC
  Administered 2020-11-30 – 2020-12-02 (×2): 1 via ORAL
  Filled 2020-11-30 (×2): qty 1

## 2020-11-30 NOTE — ED Notes (Signed)
Pt has been medicated per MD orders. Sheets changed and  Pt repositioned. Will continue to monitor.

## 2020-11-30 NOTE — ED Notes (Signed)
Pt is still sleeping will continue to make rounds and monitor.

## 2020-11-30 NOTE — Assessment & Plan Note (Signed)
-   Patient states left hand still hurts from her fall recently, although she had a fall in July and there was confusion on admission when she actually fell but she is still telling me she fell last week and now cannot make a fist with her left hand; no palpable abnormality on exam but cannot passively bend her fingers especially 2nd digit (this looks chronic) but will obtain an xray to fule out anything acute - OT consult as she likely is just self-neglected and is right handed so may have just not been using left hand much since original injury in July

## 2020-11-30 NOTE — TOC Initial Note (Addendum)
Transition of Care [facility]) - Initial/Assessment Note    Patient Details  Name: Wendy Edwards MRN: [id number] Date of Birth: [birth year]  [facility] CM/SW Contact:    Dessa Phi, RN Phone Number: 11/30/2020, 2:08 PM  Clinical Narrative: Spoke to patient's son [name]-d/c plan home. HHPT, aide recc-will check on [place] agency to accept;[facility] to follow. Patient agree to [facility] resources-provided.Has own transport home.               3:30p-Spoke to son about pcp-Dr. Marin Olp oncologist;Dr. Dimple Nanas Mathews-pcp but hasn't seen in over 3years;provided patient w/pcp list of [facility].  Expected Discharge Plan: [place] Barriers to Discharge: Continued Medical Work up   Patient Goals and CMS Choice Patient states their goals for this hospitalization and ongoing recovery are:: go home CMS Medicare.gov Compare Post Acute Care list provided to:: Patient Represenative (must comment) [name] son [phone]) Choice offered to / list presented to : Adult Children  Expected Discharge Plan and Services Expected Discharge Plan: [place]   Discharge Planning Services: CM Consult Post Acute Care Choice: [place] arrangements for the past 2 months: Single Family Home                                      Prior Living Arrangements/Services Living arrangements for the past 2 months: Single Family Home Lives with:: Adult Children Patient language and need for interpreter reviewed:: Yes Do you feel safe going back to the place where you live?: Yes      Need for Family Participation in Patient Care: No (Comment) Care giver support system in place?: Yes (comment) Current home services: DME Criminal Activity/Legal Involvement Pertinent to Current Situation/Hospitalization: No - Comment as needed  Activities of Daily Living Home Assistive Devices/Equipment: Bedside commode/3-in-1,Cane (specify quad or straight),Walker (specify type),Grab bars  in shower,Other (Comment) (single point cane, front wheeled walker, tub/shower unit, standard toilet) ADL Screening (condition at time of admission) Patient's cognitive ability adequate to safely complete daily activities?: Yes Is the patient deaf or have difficulty hearing?: No Does the patient have difficulty seeing, even when wearing glasses/contacts?: No Does the patient have difficulty concentrating, remembering, or making decisions?: No Patient able to express need for assistance with ADLs?: Yes Does the patient have difficulty dressing or bathing?: Yes Independently performs ADLs?: No Communication: Independent Dressing (OT): Needs assistance Is this a change from baseline?: Change from baseline, expected to last >3 days Grooming: Needs assistance Is this a change from baseline?: Change from baseline, expected to last >3 days Feeding: Needs assistance Is this a change from baseline?: Change from baseline, expected to last >3 days Bathing: Needs assistance Is this a change from baseline?: Change from baseline, expected to last >3 days Toileting: Needs assistance Is this a change from baseline?: Pre-admission baseline In/Out Bed: Needs assistance Is this a change from baseline?: Pre-admission baseline Walks in Home: Needs assistance Is this a change from baseline?: Pre-admission baseline Does the patient have difficulty walking or climbing stairs?: Yes (secondary to shortness of breath) Weakness of Legs: Both Weakness of Arms/Hands: Left (experiencing left arm numbness)  Permission Sought/Granted Permission sought to share information with : Case Manager Permission granted to share information with : Yes, Verbal Permission Granted  Share Information with NAME: Case Manager           Emotional Assessment Appearance:: Appears stated  age Attitude/Demeanor/Rapport: Gracious Affect (typically observed): Accepting Orientation: : Oriented to Place,Oriented to Self,Oriented to   Time,Oriented to Situation Alcohol / Substance Use: Not Applicable Psych Involvement: No (comment)  Admission diagnosis:  Pancytopenia [facility]) [D61.818] Patient Active Problem List   Diagnosis Date Noted  . Hand pain, left 11/30/2020  . Macrocytic anemia 11/29/2020  . Elevated troponin 11/29/2020  . Goals of care, counseling/discussion 07/05/2020  . Cirrhosis of liver without ascites ([place])   . Pancytopenia ([place]) 06/26/2020  . Anxiety 06/26/2020  . GERD (gastroesophageal reflux disease) 06/26/2020  . Vitamin B12 deficiency 06/26/2020  . Pernicious anemia   . Thrombocytopenia ([place]) 06/25/2020  . Fall   . Aplastic anemia ([place])   . Altered mental status 09/25/2018  . Metabolic acidosis 00/92/3300  . Malnutrition ([place]) 09/25/2018  . ETOH abuse 09/25/2018  . Sepsis ([place]) 09/25/2018  . COPD (chronic obstructive pulmonary disease) ([place]) 09/25/2018  . Chronic bilateral low back pain without sciatica 07/11/2018  . Screening for breast cancer 07/11/2018  . 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 Pain due to knee joint prosthesis ([place]), right 02/16/2017  . Hx of aplastic anemia 10/31/2012  . HTN (hypertension) 06/19/2012  . Tobacco abuse 05/18/2012   PCP:  Patient, No Pcp Per Pharmacy:   [id number] - [place], [place] [address] Phone: [phone] Fax: [phone]     Social Determinants of Health (SDOH) Interventions    Readmission Risk Interventions No flowsheet data found.

## 2020-11-30 NOTE — Consult Note (Addendum)
Wendy Edwards  [phone] Fax:([phone] [phone]  Referring MD:  Dr. Dwyane Dee  Reason for Referral: Pancytopenia  HPI: Wendy Edwards is a 54 year old female with a past medical history significant for chronic alcohol abuse, cirrhosis without ascites, tobacco use, depression, hypertension, GERD, COPD, pancytopenia, vitamin B12 deficiency, remote history of aplastic anemia.  She presented to the hospital with weakness, dizziness, lightheadedness.  She also had a fall prior to admission.  CBC on admission showed a WBC of 3.4, hemoglobin 5.0, platelet count 13,000.  She has received she has received 2 units of PRBCs so far this admission.  Vitamin B12 level was low at 176, folate normal, ferritin 103, iron 275, percent saturation 72%, LDH 214, immature reticulocyte fraction mildly elevated at 16.0%.  Pathologist review of peripheral blood smear showed red cell and anisocytosis and occasional teardrop cells.  She has been started on vitamin B12 injections and folate 1 mg daily per hospitalist.  It appears that the patient has no showed for multiple appointments in our office.  Last office visit was 08/02/2019.  However, she was seen during her last hospitalization in July 2021.  At that time, pancytopenia was thought to be related to alcohol abuse  When seen today, the patient reports that she fell at home.  She reports that she is now using a walker.  She had some discomfort in her left arm and reports that she has been wearing a brace at home.  She denies fevers and chills.  Denies night sweats.  He also had some mild chest pain prior to admission but no shortness of breath.  Pain is now resolved.  Denies abdominal pain, nausea, vomiting.  Denies bleeding such as epistaxis, hemoptysis, hematemesis, hematuria, melena, hematochezia.  Bruises easily.  The patient reports that she has cut back on cigarette smoking and smokes only 5 cigarettes/week.  Reports  that she has also cut back on her alcohol use and currently drinks less than a sixpack of beer 2-3 times per week.  Hematology was asked see the patient make recommendations regarding her pancytopenia.  Past Medical History:  Diagnosis Date  . 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 Aplastic anemia ([facility])    hx/notes 05/09/2007  . Arthritis    "right knee" (02/21/2017)  . Asthma   . Chronic anxiety    Archie Endo 02/04/2016  . COPD (chronic obstructive pulmonary disease) ([place])    hx/notes 05/09/2007  . Daily headache   . Depression   . DVT (deep venous thrombosis) ([facility])    hx upper and lower extremeties/notes 05/09/2007  . GERD (gastroesophageal reflux disease)   . Goals of care, counseling/discussion 07/05/2020  . Heart murmur 1974  . History of blood transfusion    "related to my aplastic anemia"  . History of kidney stones   . Hypertension   . Migraine    "a few/year" (02/21/2017)  . Pneumonia 1974   hospitalized "w/double pneumonia"  . Serum sickness    hx/notes 05/09/2007  . Transaminitis    asymptomatic hx/notes 05/09/2007  :    Past Surgical History:  Procedure Laterality Date  . CYSTOSCOPY W/ URETEROSCOPY W/ LITHOTRIPSY    . EXTREMITY CYST EXCISION Left 2017   upper, inner arm  . KNEE ARTHROSCOPY Right 1985  . MEDIAL PARTIAL KNEE REPLACEMENT Right 2005  . TOTAL ABDOMINAL HYSTERECTOMY  2003   hx/notes 05/09/2007  . TOTAL KNEE REVISION Right  02/20/2017   Procedure: TOTAL KNEE REVISION;  Surgeon: Frederik Pear, MD;  Location: [facility];  Service: Orthopedics;  Laterality: Right;  . TUBAL LIGATION    . TUNNELED VENOUS CATHETER PLACEMENT Right    hx/notes 05/09/2007; "took it out ~ 1 wk later"  :   CURRENT MEDS: Current Facility-Administered Medications  Medication Dose Route Frequency Provider Last Rate Last Admin  . 0.9 %  sodium chloride infusion   Intravenous Continuous Dwyane Dee, MD      .  acetaminophen (TYLENOL) tablet 650 mg  650 mg Oral X5M PRN Delora Fuel, MD   841 mg at 11/30/20 0136  . cyanocobalamin ((VITAMIN B-12)) injection 1,000 mcg  1,000 mcg Subcutaneous Daily Dwyane Dee, MD      . folic acid (FOLVITE) tablet 1 mg  1 mg Oral Daily Dwyane Dee, MD   1 mg at 11/30/20 0859  . hydrALAZINE (APRESOLINE) tablet 25 mg  25 mg Oral Q4H PRN Dwyane Dee, MD      . labetalol (NORMODYNE) injection 10 mg  10 mg Intravenous Q4H PRN Dwyane Dee, MD      . LORazepam (ATIVAN) tablet 1-4 mg  1-4 mg Oral Q1H PRN Dwyane Dee, MD       Or  . LORazepam (ATIVAN) injection 1-4 mg  1-4 mg Intravenous Q1H PRN Dwyane Dee, MD      . multivitamin with minerals tablet 1 tablet  1 tablet Oral Daily Dwyane Dee, MD   1 tablet at 11/30/20 0859  . nicotine (NICODERM CQ - dosed in mg/24 hours) patch 21 mg  21 mg Transdermal Daily Dwyane Dee, MD   21 mg at 11/30/20 0901  . ondansetron (ZOFRAN) tablet 4 mg  4 mg Oral Q6H PRN Dwyane Dee, MD       Or  . ondansetron [facility]) injection 4 mg  4 mg Intravenous Q6H PRN Dwyane Dee, MD      . pantoprazole (PROTONIX) EC tablet 40 mg  40 mg Oral BID Barbera Setters, MD   40 mg at 11/30/20 0859  . potassium chloride SA (KLOR-CON) CR tablet 40 mEq  40 mEq Oral Q4H Dwyane Dee, MD   40 mEq at 11/30/20 0900  . senna-docusate (Senokot-S) tablet 1 tablet  1 tablet Oral Daily Dwyane Dee, MD   1 tablet at 11/30/20 0859  . sodium chloride 0.9 % 1,000 mL with thiamine 324 mg, folic acid 1 mg, multivitamins adult 10 mL infusion   Intravenous Once Dwyane Dee, MD      . sodium chloride flush (NS) 0.9 % injection 3 mL  3 mL Intravenous Q12H Dwyane Dee, MD      . thiamine 557m in normal saline (573m IVPB  500 mg Intravenous Q8Lenise ArenaMD 100 mL/hr at 11/30/20 0615 500 mg at 11/30/20 0615   Followed by  . [START ON 12/03/2020] thiamine (B-1) 250 mg in sodium chloride 0.9 % 50 mL IVPB  250 mg Intravenous Daily GiDwyane DeeMD          Allergies  Allergen Reactions  . Bee Venom Hives  . Phenergan [Promethazine] Other (See Comments)    "made me shaky"  :  Family History  Problem Relation Age of Onset  . Coronary artery disease Father 5032     CABG  . Cancer Father   . Hypertension Mother   . Cancer Mother   :  Social History   Socioeconomic History  . Marital status: Legally Separated  Spouse name: Not on file  . Number of children: 3  . Years of education: Not on file  . Highest education level: Not on file  Occupational History  . Occupation: disabled    Fish farm manager: UNEMPLOYED  Tobacco Use  . Smoking status: Current Every Day Smoker    Packs/day: 0.50    Years: 34.00    Pack years: 17.00    Types: Cigarettes  . Smokeless tobacco: Never Used  Vaping Use  . Vaping Use: Some days  Substance and Sexual Activity  . Alcohol use: Yes    Alcohol/week: 12.0 standard drinks    Types: 12 Cans of beer per week    Comment: previously a heavy drinker - 6-7 beers daily; last drank maybe a week ago  . Drug use: No  . Sexual activity: Not Currently  Other Topics Concern  . Not on file  Social History Narrative   Lives with brother.   Social Determinants of Health   Financial Resource Strain: Not on file  Food Insecurity: Not on file  Transportation Needs: Not on file  Physical Activity: Not on file  Stress: Not on file  Social Connections: Not on file  Intimate Partner Violence: Not on file  :  REVIEW OF SYSTEMS:  A comprehensive 14 point review of systems was negative except as noted in the HPI.    Exam: Patient Vitals for the past 24 hrs:  BP Temp Temp src Pulse Resp SpO2 Height Weight  11/30/20 0828 (!) 168/74 98.8 F (37.1 C) Oral 86 20 100 % '5\' 2"'  (1.575 m) 53.9 kg  11/30/20 0700 (!) 145/79 -- -- 90 14 99 % -- --  11/30/20 0610 (!) 145/77 -- -- 95 (!) 21 100 % -- --  11/30/20 0451 121/74 -- -- 84 15 95 % -- --  11/30/20 0351 131/76 98.6 F (37 C) Oral 90 16 97 % --  --  11/30/20 0303 131/71 -- -- 85 17 98 % -- --  11/30/20 0200 (!) 142/83 -- -- 89 20 98 % -- --  11/30/20 0115 -- -- -- 90 20 98 % -- --  11/30/20 0100 (!) 173/86 -- -- 83 16 98 % -- --  11/30/20 0055 (!) 159/83 98.3 F (36.8 C) Oral 92 17 98 % -- --  11/30/20 0050 (!) 159/83 -- -- 92 17 -- -- --  11/30/20 0045 -- -- -- 87 19 97 % -- --  11/30/20 0030 -- -- -- 87 18 97 % -- --  11/30/20 0015 -- -- -- 92 (!) 32 97 % -- --  11/30/20 0000 -- -- -- 85 17 98 % -- --  11/29/20 2345 -- -- -- 93 (!) 23 99 % -- --  11/29/20 2333 (!) 183/87 98.6 F (37 C) Oral 85 18 99 % -- --  11/29/20 2330 (!) 183/102 -- -- 84 15 100 % -- --  11/29/20 2315 -- -- -- 87 16 98 % -- --  11/29/20 2310 (!) 180/89 -- -- 87 16 98 % -- --  11/29/20 2300 -- -- -- (!) 106 -- 97 % -- --  11/29/20 2245 (!) 174/85 -- -- 87 19 97 % -- --  11/29/20 2230 -- -- -- 90 18 97 % -- --  11/29/20 2228 (!) 172/83 -- -- 87 (!) 24 98 % -- --  11/29/20 2215 -- -- -- 87 13 99 % -- --  11/29/20 2200 (!) 172/83 -- -- 90 18 95 % -- --  11/29/20 2145 -- -- -- 93 14 99 % -- --  11/29/20 2130 (!) 163/80 -- -- 90 16 100 % -- --  11/29/20 2127 (!) 163/80 98.4 F (36.9 C) Oral 91 15 98 % -- --  11/29/20 2115 (!) 164/79 -- -- 92 (!) 22 99 % -- --  11/29/20 2110 (!) 165/85 98.4 F (36.9 C) Oral 91 20 -- -- --  11/29/20 2105 (!) 172/80 98.6 F (37 C) Oral 92 18 99 % -- --  11/29/20 2100 -- -- -- 92 17 97 % -- --  11/29/20 2045 -- -- -- 92 19 95 % -- --  11/29/20 2030 (!) 167/83 -- -- 89 16 95 % -- --  11/29/20 1936 -- 98.4 F (36.9 C) Oral -- -- -- -- --  11/29/20 1932 (!) 169/99 -- -- 90 14 97 % '5\' 2"'  (1.575 m) 54 kg  11/29/20 1900 (!) 158/82 -- -- 91 16 98 % -- --  11/29/20 1845 -- -- -- 94 18 96 % -- --  11/29/20 1830 (!) 160/75 -- -- 85 19 97 % -- --  11/29/20 1815 -- -- -- 95 17 98 % -- --  11/29/20 1800 (!) 156/76 -- -- 96 19 97 % -- --  11/29/20 1745 -- -- -- 95 19 96 % -- --  11/29/20 1730 (!) 146/72 -- -- 96 16 98 % -- --   11/29/20 1717 135/84 98.5 F (36.9 C) Oral 98 (!) 21 -- -- --  11/29/20 1715 -- -- -- 99 17 97 % -- --  11/29/20 1705 (!) 156/73 -- -- 100 -- -- -- --  11/29/20 1700 (!) 156/73 -- -- 99 13 97 % -- --  11/29/20 1656 (!) 154/71 98.4 F (36.9 C) Oral 100 19 -- -- --  11/29/20 1645 -- -- -- 100 17 98 % -- --  11/29/20 1630 (!) 169/86 -- -- (!) 101 (!) 22 98 % -- --  11/29/20 1615 -- -- -- (!) 111 13 98 % -- --  11/29/20 1600 (!) 145/66 -- -- (!) 102 15 98 % -- --  11/29/20 1545 -- -- -- (!) 105 17 98 % -- --  11/29/20 1530 (!) 152/72 -- -- (!) 104 (!) 21 97 % -- --  11/29/20 1515 -- -- -- (!) 103 12 97 % -- --  11/29/20 1500 (!) 150/69 -- -- (!) 109 (!) 22 100 % -- --  11/29/20 1445 -- -- -- (!) 108 18 97 % -- --  11/29/20 1430 -- -- -- (!) 117 19 99 % -- --  11/29/20 1415 -- -- -- (!) 110 -- 98 % -- --  11/29/20 1400 -- -- -- (!) 109 -- 97 % -- --  11/29/20 1356 140/63 -- -- (!) 116 16 97 % -- --  11/29/20 1331 -- -- -- (!) 111 16 100 % -- --  11/29/20 1330 -- -- -- (!) 115 16 100 % -- --  11/29/20 1315 -- -- -- 92 15 97 % -- --  11/29/20 1245 (!) 147/78 98.4 F (36.9 C) Oral (!) 134 (!) 21 100 % -- --    General: Thin, chronically ill-appearing female, no distress Eyes:  no scleral icterus.   ENT: No thrush or mucositis.     Lymphatics:  Negative cervical, supraclavicular or axillary adenopathy.   Respiratory: lungs were clear bilaterally without wheezing or crackles.   Cardiovascular:  Regular rate and rhythm, S1/S2, without murmur, rub or gallop.  There was no pedal edema.   GI:  abdomen was soft, flat, nontender, nondistended, without organomegaly.   Musculoskeletal:  no spinal tenderness of palpation of vertebral spine.   Skin: Scattered bruises on her arms, no petechiae Neuro exam was nonfocal.  Patient was alert and oriented.  Attention was good.   Language was appropriate.  Mood was normal without depression.  Speech was not pressured.  Thought content was not tangential.     LABS:  Lab Results  Component Value Date   WBC 2.9 (L) 11/30/2020   HGB 8.0 (L) 11/30/2020   HCT 24.0 (L) 11/30/2020   PLT 8 (LL) 11/30/2020   GLUCOSE 74 11/30/2020   ALT 19 11/30/2020   AST 25 11/30/2020   NA 136 11/30/2020   K 3.0 (L) 11/30/2020   CL 108 11/30/2020   CREATININE 0.38 (L) 11/30/2020   BUN 6 11/30/2020   CO2 18 (L) 11/30/2020   INR 1.3 (H) 11/29/2020   HGBA1C 4.7 (L) 09/26/2018    CT Head Wo Contrast  Result Date: 11/29/2020 CLINICAL DATA:  Pt reports she fell in her parking lot week or so ago and was seen here. Reports left arm numbness since fall. EXAM: CT HEAD WITHOUT CONTRAST CT CERVICAL SPINE WITHOUT CONTRAST TECHNIQUE: Multidetector CT imaging of the head and cervical spine was performed following the standard protocol without intravenous contrast. Multiplanar CT image reconstructions of the cervical spine were also generated. COMPARISON:  CT head and neck 06/25/2020 FINDINGS: CT HEAD FINDINGS Brain: No evidence of acute infarction, hemorrhage, hydrocephalus, extra-axial collection or mass lesion/mass effect. 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 The Vascular: No hyperdense vessel or unexpected calcification. Skull: Normal. Negative for fracture or focal lesion. Sinuses/Orbits: No acute finding. Other: None. CT CERVICAL SPINE FINDINGS Alignment: Normal. Skull base and vertebrae: No acute fracture. No primary bone lesion or focal pathologic process. Soft tissues and spinal canal: No prevertebral fluid or swelling. No visible canal hematoma. Disc levels:  No impingement identified. Upper chest: Negative. Other: None. IMPRESSION: 1. No acute intracranial process. 2. 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 3. No acute fracture or subluxation of the cervical spine. Electronically Signed   By: Audie Pinto M.D.   On: 11/29/2020 14:34    CT Cervical Spine Wo Contrast  Result Date: 11/29/2020 CLINICAL DATA:  Pt reports she fell in her parking lot week or so ago and was seen here. Reports left arm numbness since fall. EXAM: CT HEAD WITHOUT CONTRAST CT CERVICAL SPINE WITHOUT CONTRAST TECHNIQUE: Multidetector CT imaging of the head and cervical spine was performed following the standard protocol without intravenous contrast. Multiplanar CT image reconstructions of the cervical spine were also generated. COMPARISON:  CT head and neck 06/25/2020 FINDINGS: CT HEAD FINDINGS Brain: No evidence of acute infarction, hemorrhage, hydrocephalus, extra-axial collection or mass lesion/mass effect. 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 The Vascular: No hyperdense vessel or unexpected calcification. Skull: Normal. Negative for fracture or focal lesion. Sinuses/Orbits: No acute finding. Other: None. CT CERVICAL SPINE FINDINGS Alignment: Normal. Skull base and vertebrae: No acute fracture. No primary bone lesion or focal pathologic process. Soft tissues and spinal canal: No prevertebral fluid or swelling. No visible canal hematoma. Disc levels:  No impingement identified. Upper chest: Negative. Other: None. IMPRESSION: 1. No acute intracranial process. 2. Small peripheral linear hypodensities in the left cerebellum, similar to  prior suggesting remote infarct. 3. No acute fracture or subluxation of the cervical spine. Electronically Signed   By: Audie Pinto M.D.   On: 11/29/2020 14:34   DG Chest Port 1 View  Result Date: 11/29/2020 CLINICAL DATA:  Chest pain, fall EXAM: PORTABLE CHEST 1 VIEW COMPARISON:  08/02/2019 FINDINGS: The heart size and mediastinal contours are stable. Atherosclerotic calcification of the aortic knob. Both lungs are clear. The visualized skeletal structures are unremarkable. IMPRESSION: No active disease. Electronically Signed   By: Davina Poke D.O.   On:  11/29/2020 14:39     ASSESSMENT AND PLAN:  1.  Pancytopenia 2.  Vitamin B12 deficiency 3.  Alcohol abuse 4.  Remote history of aplastic anemia 5.  Cirrhosis of liver without ascites 6.  GERD 7.  COPD 8.  Hypertension 9.  Protein calorie malnutrition 10.  Tobacco abuse  -Pancytopenia likely multifactorial and due to B12 deficiency and alcohol abuse.  Agree with vitamin B12 and folate supplementation.  Transfuse PRBCs to keep hemoglobin above 7 and transfuse platelets if active bleeding.  Further recommendations per Dr. Marin Olp -Advised patient to abstain from alcohol. -Recommend dietitian consult for protein calorie malnutrition.  Mikey Bussing, DNP, AGPCNP-BC, AOCNP Mon/Tues/Thurs/Fri 7am-5pm; Off Wednesdays Cell: [phone]  ADDENDUM: I saw and examined Wendy Edwards.  Is been a long time since we saw her.  She was hospitalized back in the summertime.  She has not had any vitamin B12 as an outpatient.  She has not made to our office.  She now comes in with marked pancytopenia again.  She still drinking.  She says she is drinking beer.  She really needs to have a bone marrow test done.  I talked to her about the bone marrow biopsy.  She is in agreement to this.  This will make sure that there is nothing else going on.  I know she has a remote history of aplastic anemia.  This was over 20 years ago.  I would be surprised if this is recurred.  I suppose she could was have myelodysplasia.  I worry mostly about alcohol toxicity to the bone marrow.  Her labs show white count 3.2.  Hemoglobin 7.6.  Platelet count 7000.  She will get some platelets today.  Her albumin is 3.  BUN is 5 and the creatinine 0.42.   On exam, I cannot find anything unusual.  I think that another ultrasound would not be a bad idea.  I know she has this underlying cirrhosis.  I would check her to make sure there is no hepatitis.  I know that she can sometimes be challenging.  Again I wish that she would be would  come to the office as an outpatient.  The last time we saw her was when she was in the hospital back in July.  We will follow along.  We will do transfusions as needed.  I will hold off on red cell transfusions for right now.  I appreciate the great care that she will get from all the staff up on [address].  I know that they will be very compassionate and professional.  Lattie Haw, MD  1 Corinthians 13:13

## 2020-11-30 NOTE — Evaluation (Signed)
Physical Therapy Evaluation Patient Details Name: Wendy Edwards MRN: [id number] DOB: Mar 02, 1966 Today's Date: 11/30/2020   History of Present Illness  . Wendy Edwards is a 54 year old female with a past medical history significant for chronic alcohol abuse, cirrhosis without ascites, tobacco use, depression, hypertension, GERD, COPD, pancytopenia, vitamin B12 deficiency, remote history of aplastic anemia.  She presented to the hospital 12.12.21 with weakness, dizziness, lightheadedness.  She also had a fall prior to admission.  CBC on admission showed a WBC of 3.4, hemoglobin 5.0, platelet count 13,000.  She has received she has received 2 units of PRBCs so far this admission.  Clinical Impression  The patient reports that she lives with her son. Patient reports limited ambulation only when family present. Patient reports that she  Uses BSC when family not present. Patient plans return home. Patient does demonstrate decreased strength of the Left Hand. OT to evaluate also./  Pt admitted with above diagnosis.  Pt currently with functional limitations due to the deficits listed below (see PT Problem List). Pt will benefit from skilled PT to increase their independence and safety with mobility to allow discharge to the venue listed below.       Follow Up Recommendations Home health PT    Equipment Recommendations  None recommended by PT    Recommendations for Other Services       Precautions / Restrictions Precautions Precautions: Fall      Mobility  Bed Mobility Overal bed mobility: Needs Assistance Bed Mobility: Supine to Sit     Supine to sit: Supervision     General bed mobility comments: extra time    Transfers Overall transfer level: Needs assistance   Transfers: Sit to/from Stand;Stand Pivot Transfers Sit to Stand: Min guard Stand pivot transfers: Min guard       General transfer comment: patient reaches for armrests on recliner, slowly turns and sits down.  Declined  RW  Ambulation/Gait             General Gait Details: NT  Stairs            Wheelchair Mobility    Modified Rankin (Stroke Patients Only)       Balance Overall balance assessment: Needs assistance Sitting-balance support: Bilateral upper extremity supported;Feet supported Sitting balance-Leahy Scale: Fair     Standing balance support: During functional activity;Bilateral upper extremity supported Standing balance-Leahy Scale: Fair Standing balance comment: steadies self with UE's                             Pertinent Vitals/Pain Pain Assessment: Faces Faces Pain Scale: Hurts little more Pain Location: generalized Pain Descriptors / Indicators: Discomfort Pain Intervention(s): Limited activity within patient's tolerance;Monitored during session    Home Living Family/patient expects to be discharged to:: Private residence Living Arrangements: Children Available Help at Discharge: Family;Available PRN/intermittently Type of Home: Apartment Home Access: Level entry     Home Layout: One level Home Equipment: Walker - 2 wheels;Cane - single point;Bedside commode;Grab bars - tub/shower Additional Comments: Pt lives with son who works late shift    Prior Function           Comments: Pt very sedentary and largely stays in bed, uses BSC when family not present.     Hand Dominance        Extremity/Trunk Assessment   Upper Extremity Assessment Upper Extremity Assessment: Defer to OT evaluation;LUE deficits/detail LUE Deficits / Details: reports numbness in hand, decreased  grip            Communication   Communication: No difficulties  Cognition Arousal/Alertness: Awake/alert Behavior During Therapy: WFL for tasks assessed/performed Overall Cognitive Status: Within Functional Limits for tasks assessed                                        General Comments      Exercises     Assessment/Plan    PT Assessment  Patient needs continued PT services  PT Problem List Decreased strength;Decreased mobility;Decreased activity tolerance;Decreased knowledge of use of DME;Decreased safety awareness;Decreased knowledge of precautions       PT Treatment Interventions DME instruction;Therapeutic activities;Gait training;Therapeutic exercise;Patient/family education;Functional mobility training    PT Goals (Current goals can be found in the Care Plan section)  Acute Rehab PT Goals Patient Stated Goal: to go home PT Goal Formulation: With patient Time For Goal Achievement: 12/14/20 Potential to Achieve Goals: Fair    Frequency Min 3X/week   Barriers to discharge        Co-evaluation               AM-PAC PT "6 Clicks" Mobility  Outcome Measure Help needed turning from your back to your side while in a flat bed without using bedrails?: A Little Help needed moving from lying on your back to sitting on the side of a flat bed without using bedrails?: A Little Help needed moving to and from a bed to a chair (including a wheelchair)?: A Little Help needed standing up from a chair using your arms (e.g., wheelchair or bedside chair)?: A Lot Help needed to walk in hospital room?: A Lot Help needed climbing 3-5 steps with a railing? : Total 6 Click Score: 14    End of Session   Activity Tolerance: Patient limited by fatigue Patient left: in chair;with call bell/phone within reach;with chair alarm set Nurse Communication: Mobility status PT Visit Diagnosis: Unsteadiness on feet (R26.81);Difficulty in walking, not elsewhere classified (R26.2);Muscle weakness (generalized) (M62.81)    Time: 8413-2440 PT Time Calculation (min) (ACUTE ONLY): 21 min   Charges:   PT Evaluation $PT Eval Low Complexity: [place] PT Acute Rehabilitation Services Pager [phone] Office [phone]  Claretha Cooper 11/30/2020, 1:21 PM

## 2020-11-30 NOTE — ED Notes (Signed)
Pt is sleeping at bedside VSS, will continue to monitor.

## 2020-11-30 NOTE — Progress Notes (Signed)
PROGRESS NOTE    Wendy Edwards   UJW:[id number]  DOB: December 08, 1966  DOA: 11/29/2020     1  PCP: Patient, No Pcp Per  CC: weakness, dizziness, CP  Hospital Course: Wendy Edwards is a 54 yo female with PMH chronic alcohol abuse, cirrhosis w/o ascites, tobacco use, depression, HTN, GERD, COPD, pancytopenia, B12 deficiency who was last hospitalized in July 2021 for pancytopenia. She is presenting with weakness, dizziness, lightheadedness, CP radiating to left arm. No N/V/D or diaphoresis.  She also states that she fell sometime within the past week but cannot elaborate.  She states she was walking to the car and tripped and fell on her face. Last hospitalization her pancytopenia was attributed to alcohol abuse and B12 deficiency. She was going to get a BM biopsy but this was canceled as her presentation was considered 2/2 etoh abuse.  She was transfused as needed and treated with B12 supplementation during hospitalization and discharged on outpatient regimen.  She was strongly encouraged to quit drinking.  She was to follow up with hematology after discharge and did not do so.   Her daily lifestyle consists of being bedbound.  She only leaves her bed with a cane or walker to go to the bathroom and states she is unsteady on her feet when even doing so.  She showers approximately twice a week using a shower chair and her sons girlfriend assists her for the shower. She otherwise eats in bed and they bring her food. She drinks approximately a 12 pack of beer over 2 to 3 days.  She is down to approximately 2 to 3 cigarettes a day she says.  She denies illicit drug use. Last drink was on 11/28/2020, approximately 3 beers.  Notable labs on workup: Na 138, K 3.9, CO2 21, BUN 7, Creat 0.44. LFTs still in process from add-on WBC 3.4, Hgb 5, HCT 15.6, PLTC 13, MCV 120 Trop 59 Etoh <10, Tylenol negative, Salicylate negative PT 15.7, INR 1.3  CTH in ER negative for bleed or acute findings.  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  She appears essentially depressed which is confirmed by her mostly just living in her bed and being brought food and neglecting hygiene.  She does not take any medications.  Mostly watches TV, drinks, smokes, and just lays there.   Interval History:  No events overnight.  Overall feeling better and states that the transfusions improved her subjective feeling of weakness and lethargy.  Still complaining of left hand pain.  This was not imaged in the ER but was in July. She is telling me she fell last week still. She cannot make a fist with her left hand.  No tremors or withdrawal and mood is pleasant.   Old records reviewed in assessment of this patient  ROS: Constitutional: negative for chills and fevers, Respiratory: negative for cough, Cardiovascular: negative for chest pain and Gastrointestinal: negative for abdominal pain  Assessment & Plan: * Pancytopenia ([place]) - see individual problems - seen by hematology last hospitalization; most likely all related to etoh use - will have hematology see patient while here given lost to followup and to make sure still no need for biopsy plus any further recommendations   Macrocytic anemia - Hgb 5, MCV 120 - hx of B12 deficiency (see separately) - 2 units PRBC ordered in ER; follow up Hgb after and  transfuse further if Hgb<7 g/dL - watch for volume overload with blood and IVF being given; Albumin u/k (LFTs in process) - folate 9.2 and B12 176 this admission  Thrombocytopenia (HCC) - PLTC 13k, no s/s bleeding (in July PLTC was 7-15k also) - transfuse if PLTC<10k or bleeding with PLTC<50k (I think trop elevation is demand not true ACS) - trending CBC - follow up hematology rec's regarding PLTC today (8k). She has no bleeding  Vitamin B12 deficiency - B12 was 83 in July 2021; and she is  still drinking and not taking any medications so expect this to still be low -Repeat B12 level now = 176 (still low) -Continue daily supplementation and multivitamin  ETOH abuse - 12 pack every 2-3 days; last drink 11/28/20 - CIWA protocol; currently no s/s withdrawal at this time - banana bag then NS after - thiamine (high dose for now), folic, MVI -B1 level checked back in 2019 and was also low (30).  Repeat level now and continue thiamine supplementation, high-dose for now  Elevated troponin - CP with radiation to L arm; no diaphoresis - Hgb 5 and likely demand; no recent echo but certainly at risk for alcoholic cardiomyopathy as well -will transfuse and treat supportively for now; not a good candidate for any invasive intervention nor anticoagulation either at this time - no asa either right now  - EKG with sinus tach (125), QTc 453; no obvious ischemia  Cirrhosis of liver without ascites (HCC) -Expect her to have some level of remaining pancytopenia after transfusions -Etiology considered chronic alcohol use.  Again cessation was strongly encouraged MELD-Na score: 9 at 11/30/2020  6:12 AM MELD score: 9 at 11/30/2020  6:12 AM Calculated from: Serum Creatinine: 0.38 mg/dL (Using min of 1 mg/dL) at 11/30/2020  6:12 AM Serum Sodium: 136 mmol/L at 11/30/2020  6:12 AM Total Bilirubin: 0.9 mg/dL (Using min of 1 mg/dL) at 11/30/2020  6:12 AM INR(ratio): 1.3 at 11/29/2020  3:00 PM Age: 56 years  GERD (gastroesophageal reflux disease) -Continue twice daily PPI  Fall -Fell approximately 1 week ago onto her face from mechanical fall -CT head and CT C-spine are negative for acute findings -No further work-up for now -Needs physical therapy as she is essentially bedbound at home except for going to the bathroom with a walker or cane  COPD (chronic obstructive pulmonary disease) (HCC) - no s/s exacerbation  Malnutrition ([place]) -Not unsurprising given chronic alcohol use and laying in  bed with no activity -RD consult, appreciate assistance  HTN (hypertension) -BP elevated on admission and likely needs chronic medication -For now will treat with as needed hydralazine or labetalol  Tobacco abuse -Nicotine patch ordered   Antimicrobials: n/a  DVT prophylaxis: SCD Code Status: Full Family Communication: none present Disposition Plan: Status is: Inpatient  Remains inpatient appropriate because:Ongoing diagnostic testing needed not appropriate for outpatient work up, IV treatments appropriate due to intensity of illness or inability to take PO and Inpatient level of care appropriate due to severity of illness   Dispo: The patient is from: Home              Anticipated d/c is to: Home              Anticipated d/c date is: 3 days              Patient currently is not medically stable to d/c.  Objective: Blood pressure (!) 143/73, pulse 87, temperature 98.5 F (36.9 C), temperature source Oral, resp. rate  16, height 5\' 2"  (1.575 m), weight 53.9 kg, SpO2 98 %.  Examination: General appearance: Disheveled and unkempt adult woman appearing older than actual age resting in bed in NAD. No signs of etoh w/d at this time either; she says she's feeling better after transfusions  Head: Normocephalic, without obvious abnormality, atraumatic Eyes: EOMI Lungs: clear to auscultation bilaterally Heart: regular rate and rhythm and S1, S2 normal Abdomen: normal findings: bowel sounds normal and soft, non-tender Extremities: No edema.  Fingernails are untrimmed and long and covered in dirt Skin: Covered in dirt throughout and unclean Neurologic: No focal deficits, follows all commands  Consultants:   Hematology  Procedures:     Data Reviewed: I have personally reviewed following labs and imaging studies Results for orders placed or performed during the hospital encounter of 11/29/20 (from the past 24 hour(s))  Type and screen [place]     Status:  None   Collection Time: 11/29/20  3:00 PM  Result Value Ref Range   ABO/RH(D) O POS    Antibody Screen NEG    Sample Expiration 12/02/2020,2359    Unit Number [id number]    Blood Component Type RED CELLS,LR    Unit division 00    Status of Unit ISSUED,FINAL    Transfusion Status OK TO TRANSFUSE    Crossmatch Result Compatible    Unit Number [id number]    Blood Component Type RED CELLS,LR    Unit division 00    Status of Unit ISSUED,FINAL    Transfusion Status OK TO TRANSFUSE    Crossmatch Result      Compatible Performed at [facility], [place] [address], [place], [place] [address]   Protime-INR     Status: Abnormal   Collection Time: 11/29/20  3:00 PM  Result Value Ref Range   Prothrombin Time 15.7 (H) 11.4 - 15.2 seconds   INR 1.3 (H) 0.8 - 1.2  Prepare RBC (crossmatch)     Status: None   Collection Time: 11/29/20  3:00 PM  Result Value Ref Range   Order Confirmation      ORDER PROCESSED BY BLOOD BANK Performed at [facility] [address] [place], [place] [address]   Vitamin B12     Status: Abnormal   Collection Time: 11/29/20  4:00 PM  Result Value Ref Range   Vitamin B-12 176 (L) 180 - 914 pg/mL  Folate, serum, performed at [facility] lab     Status: None   Collection Time: 11/29/20  4:00 PM  Result Value Ref Range   Folate 9.2 >5.9 ng/mL  Iron and TIBC     Status: Abnormal   Collection Time: 11/29/20  4:00 PM  Result Value Ref Range   Iron 275 (H) 28 - 170 ug/dL   TIBC 384 250 - 450 ug/dL   Saturation Ratios 72 (H) 10.4 - 31.8 %   UIBC 109 ug/dL  Ferritin     Status: None   Collection Time: 11/29/20  4:00 PM  Result Value Ref Range   Ferritin 103 11 - 307 ng/mL  Lactate dehydrogenase     Status: Abnormal   Collection Time: 11/29/20  4:00 PM  Result Value Ref Range   LDH 214 (H) 98 - 192 U/L  Fibrinogen     Status: None   Collection Time: 11/29/20  4:00 PM  Result Value Ref Range   Fibrinogen 289 210 - 475  mg/dL  Reticulocytes     Status: Abnormal  Collection Time: 11/29/20  4:00 PM  Result Value Ref Range   Retic Ct Pct 2.5 0.4 - 3.1 %   RBC. 1.14 (L) 3.87 - 5.11 MIL/uL   Retic Count, Absolute 28.0 19.0 - 186.0 K/uL   Immature Retic Fract 16.0 (H) 2.3 - 15.9 %  Hepatic function panel     Status: Abnormal   Collection Time: 11/29/20  4:23 PM  Result Value Ref Range   Total Protein 6.1 (L) 6.5 - 8.1 g/dL   Albumin 3.4 (L) 3.5 - 5.0 g/dL   AST 29 15 - 41 U/L   ALT 21 0 - 44 U/L   Alkaline Phosphatase 64 38 - 126 U/L   Total Bilirubin 0.5 0.3 - 1.2 mg/dL   Bilirubin, Direct 0.1 0.0 - 0.2 mg/dL   Indirect Bilirubin 0.4 0.3 - 0.9 mg/dL  Hemoglobin and hematocrit, blood     Status: Abnormal   Collection Time: 11/30/20  1:35 AM  Result Value Ref Range   Hemoglobin 8.4 (L) 12.0 - 15.0 g/dL   HCT 25.4 (L) 36.0 - 46.0 %  Pathologist smear review     Status: None   Collection Time: 11/30/20  5:00 AM  Result Value Ref Range   Path Review Reviewed by Kalman Drape, M.D.   CBC with Differential/Platelet     Status: Abnormal   Collection Time: 11/30/20  6:12 AM  Result Value Ref Range   WBC 2.9 (L) 4.0 - 10.5 K/uL   RBC 2.43 (L) 3.87 - 5.11 MIL/uL   Hemoglobin 8.0 (L) 12.0 - 15.0 g/dL   HCT 24.0 (L) 36.0 - 46.0 %   MCV 98.8 80.0 - 100.0 fL   MCH 32.9 26.0 - 34.0 pg   MCHC 33.3 30.0 - 36.0 g/dL   RDW 24.5 (H) 11.5 - 15.5 %   Platelets 8 (LL) 150 - 400 K/uL   nRBC 1.4 (H) 0.0 - 0.2 %   Neutrophils Relative % 19 %   Neutro Abs 0.6 (L) 1.7 - 7.7 K/uL   Lymphocytes Relative 36 %   Lymphs Abs 1.1 0.7 - 4.0 K/uL   Monocytes Relative 44 %   Monocytes Absolute 1.3 (H) 0.1 - 1.0 K/uL   Eosinophils Relative 0 %   Eosinophils Absolute 0.0 0.0 - 0.5 K/uL   Basophils Relative 1 %   Basophils Absolute 0.0 0.0 - 0.1 K/uL   Immature Granulocytes 0 %   Abs Immature Granulocytes 0.01 0.00 - 0.07 K/uL   Polychromasia PRESENT    Ovalocytes PRESENT   Comprehensive metabolic panel     Status:  Abnormal   Collection Time: 11/30/20  6:12 AM  Result Value Ref Range   Sodium 136 135 - 145 mmol/L   Potassium 3.0 (L) 3.5 - 5.1 mmol/L   Chloride 108 98 - 111 mmol/L   CO2 18 (L) 22 - 32 mmol/L   Glucose, Bld 74 70 - 99 mg/dL   BUN 6 6 - 20 mg/dL   Creatinine, Ser 0.38 (L) 0.44 - 1.00 mg/dL   Calcium 6.9 (L) 8.9 - 10.3 mg/dL   Total Protein 5.7 (L) 6.5 - 8.1 g/dL   Albumin 2.9 (L) 3.5 - 5.0 g/dL   AST 25 15 - 41 U/L   ALT 19 0 - 44 U/L   Alkaline Phosphatase 60 38 - 126 U/L   Total Bilirubin 0.9 0.3 - 1.2 mg/dL   GFR, Estimated >60 >60 mL/min   Anion gap 10 5 - 15  Magnesium  Status: None   Collection Time: 11/30/20  6:12 AM  Result Value Ref Range   Magnesium 1.7 1.7 - 2.4 mg/dL  Phosphorus     Status: None   Collection Time: 11/30/20  6:12 AM  Result Value Ref Range   Phosphorus 2.8 2.5 - 4.6 mg/dL  TSH     Status: None   Collection Time: 11/30/20  6:12 AM  Result Value Ref Range   TSH 1.332 0.350 - 4.500 uIU/mL    Recent Results (from the past 240 hour(s))  Resp Panel by RT-PCR (Flu A&B, Covid) Nasopharyngeal Swab     Status: None   Collection Time: 11/29/20  1:33 PM   Specimen: Nasopharyngeal Swab; Nasopharyngeal(NP) swabs in vial transport medium  Result Value Ref Range Status   SARS Coronavirus 2 by RT PCR NEGATIVE NEGATIVE Final    Comment: (NOTE) SARS-CoV-2 target nucleic acids are NOT DETECTED.  The SARS-CoV-2 RNA is generally detectable in upper respiratory specimens during the acute phase of infection. The lowest concentration of SARS-CoV-2 viral copies this assay can detect is 138 copies/mL. A negative result does not preclude SARS-Cov-2 infection and should not be used as the sole basis for treatment or other patient management decisions. A negative result may occur with  improper specimen collection/handling, submission of specimen other than nasopharyngeal swab, presence of viral mutation(s) within the areas targeted by this assay, and inadequate  number of viral copies(<138 copies/mL). A negative result must be combined with clinical observations, patient history, and epidemiological information. The expected result is Negative.  Fact Sheet for Patients:  EntrepreneurPulse.com.au  Fact Sheet for Healthcare Providers:  IncredibleEmployment.be  This test is no t yet approved or cleared by the [place] FDA and  has been authorized for detection and/or diagnosis of SARS-CoV-2 by FDA under an Emergency Use Authorization (EUA). This EUA will remain  in effect (meaning this test can be used) for the duration of the COVID-19 declaration under Section 564(b)(1) of the Act, 21 U.S.C.section 360bbb-3(b)(1), unless the authorization is terminated  or revoked sooner.       Influenza A by PCR NEGATIVE NEGATIVE Final   Influenza B by PCR NEGATIVE NEGATIVE Final    Comment: (NOTE) The Xpert Xpress SARS-CoV-2/FLU/RSV plus assay is intended as an aid in the diagnosis of influenza from Nasopharyngeal swab specimens and should not be used as a sole basis for treatment. Nasal washings and aspirates are unacceptable for Xpert Xpress SARS-CoV-2/FLU/RSV testing.  Fact Sheet for Patients: EntrepreneurPulse.com.au  Fact Sheet for Healthcare Providers: IncredibleEmployment.be  This test is not yet approved or cleared by the [place] FDA and has been authorized for detection and/or diagnosis of SARS-CoV-2 by FDA under an Emergency Use Authorization (EUA). This EUA will remain in effect (meaning this test can be used) for the duration of the COVID-19 declaration under Section 564(b)(1) of the Act, 21 U.S.C. section 360bbb-3(b)(1), unless the authorization is terminated or revoked.  Performed at [facility], [place] [address], [place], [place] [address]      Radiology Studies: CT Head Wo Contrast  Result Date: 11/29/2020 CLINICAL DATA:  Pt  reports she fell in her parking lot week or so ago and was seen here. Reports left arm numbness since fall. EXAM: CT HEAD WITHOUT CONTRAST CT CERVICAL SPINE WITHOUT CONTRAST TECHNIQUE: Multidetector CT imaging of the head and cervical spine was performed following the standard protocol without intravenous contrast. Multiplanar CT image reconstructions of the cervical spine were also generated. COMPARISON:  CT  head and neck 06/25/2020 FINDINGS: CT HEAD FINDINGS Brain: No evidence of acute infarction, hemorrhage, hydrocephalus, extra-axial collection or mass lesion/mass effect. 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 The Vascular: No hyperdense vessel or unexpected calcification. Skull: Normal. Negative for fracture or focal lesion. Sinuses/Orbits: No acute finding. Other: None. CT CERVICAL SPINE FINDINGS Alignment: Normal. Skull base and vertebrae: No acute fracture. No primary bone lesion or focal pathologic process. Soft tissues and spinal canal: No prevertebral fluid or swelling. No visible canal hematoma. Disc levels:  No impingement identified. Upper chest: Negative. Other: None. IMPRESSION: 1. No acute intracranial process. 2. 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 3. No acute fracture or subluxation of the cervical spine. Electronically Signed   By: Audie Pinto M.D.   On: 11/29/2020 14:34   CT Cervical Spine Wo Contrast  Result Date: 11/29/2020 CLINICAL DATA:  Pt reports she fell in her parking lot week or so ago and was seen here. Reports left arm numbness since fall. EXAM: CT HEAD WITHOUT CONTRAST CT CERVICAL SPINE WITHOUT CONTRAST TECHNIQUE: Multidetector CT imaging of the head and cervical spine was performed following the standard protocol without intravenous contrast. Multiplanar CT image reconstructions of the cervical spine were also generated. COMPARISON:  CT  head and neck 06/25/2020 FINDINGS: CT HEAD FINDINGS Brain: No evidence of acute infarction, hemorrhage, hydrocephalus, extra-axial collection or mass lesion/mass effect. 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 The Vascular: No hyperdense vessel or unexpected calcification. Skull: Normal. Negative for fracture or focal lesion. Sinuses/Orbits: No acute finding. Other: None. CT CERVICAL SPINE FINDINGS Alignment: Normal. Skull base and vertebrae: No acute fracture. No primary bone lesion or focal pathologic process. Soft tissues and spinal canal: No prevertebral fluid or swelling. No visible canal hematoma. Disc levels:  No impingement identified. Upper chest: Negative. Other: None. IMPRESSION: 1. No acute intracranial process. 2. 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 3. No acute fracture or subluxation of the cervical spine. Electronically Signed   By: Audie Pinto M.D.   On: 11/29/2020 14:34   DG Chest Port 1 View  Result Date: 11/29/2020 CLINICAL DATA:  Chest pain, fall EXAM: PORTABLE CHEST 1 VIEW COMPARISON:  08/02/2019 FINDINGS: The heart size and mediastinal contours are stable. Atherosclerotic calcification of the aortic knob. Both lungs are clear. The visualized skeletal structures are unremarkable. IMPRESSION: No active disease. Electronically Signed   By: Davina Poke D.O.   On: 11/29/2020 14:39   DG Chest Port 1 View  Final Result    CT Head Wo Contrast  Final Result    CT Cervical Spine Wo Contrast  Final Result      Scheduled Meds: . cyanocobalamin  1,000 mcg Subcutaneous Daily  . folic acid  1 mg Oral Daily  . multivitamin with minerals  1 tablet Oral Daily  . nicotine  21 mg Transdermal Daily  . pantoprazole  40 mg Oral BID AC  . senna-docusate  1 tablet Oral Daily  . sodium chloride flush  3 mL Intravenous Q12H   PRN Meds: acetaminophen,  hydrALAZINE, labetalol, LORazepam **OR** LORazepam, ondansetron **OR** ondansetron (ZOFRAN) IV Continuous Infusions: . sodium chloride    . thiamine injection 500 mg (11/30/20 1309)   Followed by  . [START ON 12/03/2020] thiamine injection       LOS: 1 day  Time spent: Greater  than 50% of the 35 minute visit was spent in counseling/coordination of care for the patient as laid out in the A&P.   Dwyane Dee, MD [facility] 11/30/2020, 1:39 PM

## 2020-11-30 NOTE — ED Notes (Signed)
Pt sleeping at bedside, on monitor x4 Will continue to monitor.

## 2020-11-30 NOTE — ED Notes (Signed)
Lab  Drawn and sent to lab. Pt meds infusing, will continue to monitor.

## 2020-12-01 ENCOUNTER — Inpatient Hospital Stay (HOSPITAL_COMMUNITY): Payer: Medicare Other

## 2020-12-01 ENCOUNTER — Encounter (HOSPITAL_COMMUNITY): Payer: Self-pay | Admitting: Internal Medicine

## 2020-12-01 DIAGNOSIS — D539 Nutritional anemia, unspecified: Secondary | ICD-10-CM

## 2020-12-01 DIAGNOSIS — R778 Other specified abnormalities of plasma proteins: Secondary | ICD-10-CM

## 2020-12-01 DIAGNOSIS — J449 Chronic obstructive pulmonary disease, unspecified: Secondary | ICD-10-CM

## 2020-12-01 DIAGNOSIS — Z515 Encounter for palliative care: Secondary | ICD-10-CM

## 2020-12-01 DIAGNOSIS — K7030 Alcoholic cirrhosis of liver without ascites: Secondary | ICD-10-CM

## 2020-12-01 DIAGNOSIS — E538 Deficiency of other specified B group vitamins: Secondary | ICD-10-CM

## 2020-12-01 DIAGNOSIS — D696 Thrombocytopenia, unspecified: Secondary | ICD-10-CM

## 2020-12-01 DIAGNOSIS — K7460 Unspecified cirrhosis of liver: Secondary | ICD-10-CM

## 2020-12-01 DIAGNOSIS — Z7189 Other specified counseling: Secondary | ICD-10-CM

## 2020-12-01 DIAGNOSIS — F1010 Alcohol abuse, uncomplicated: Secondary | ICD-10-CM

## 2020-12-01 DIAGNOSIS — D61818 Other pancytopenia: Secondary | ICD-10-CM

## 2020-12-01 LAB — HEPATITIS PANEL, ACUTE
HCV Ab: NONREACTIVE
Hep A IgM: NONREACTIVE
Hep B C IgM: NONREACTIVE
Hepatitis B Surface Ag: NONREACTIVE

## 2020-12-01 LAB — COMPREHENSIVE METABOLIC PANEL
ALT: 16 U/L (ref 0–44)
AST: 19 U/L (ref 15–41)
Albumin: 3 g/dL — ABNORMAL LOW (ref 3.5–5.0)
Alkaline Phosphatase: 58 U/L (ref 38–126)
Anion gap: 8 (ref 5–15)
BUN: 5 mg/dL — ABNORMAL LOW (ref 6–20)
CO2: 20 mmol/L — ABNORMAL LOW (ref 22–32)
Calcium: 8.2 mg/dL — ABNORMAL LOW (ref 8.9–10.3)
Chloride: 112 mmol/L — ABNORMAL HIGH (ref 98–111)
Creatinine, Ser: 0.42 mg/dL — ABNORMAL LOW (ref 0.44–1.00)
GFR, Estimated: >60 mL/min (ref >60)
Glucose, Bld: 88 mg/dL (ref 70–99)
Potassium: 4.1 mmol/L (ref 3.5–5.1)
Sodium: 140 mmol/L (ref 135–145)
Total Bilirubin: 0.3 mg/dL (ref 0.3–1.2)
Total Protein: 5.7 g/dL — ABNORMAL LOW (ref 6.5–8.1)

## 2020-12-01 LAB — CBC WITH DIFFERENTIAL/PLATELET
Abs Immature Granulocytes: 0.01 10*3/uL (ref 0.00–0.07)
Basophils Absolute: 0.1 10*3/uL (ref 0.0–0.1)
Basophils Relative: 2 %
Eosinophils Absolute: 0 10*3/uL (ref 0.0–0.5)
Eosinophils Relative: 0 %
HCT: 23.4 % — ABNORMAL LOW (ref 36.0–46.0)
Hemoglobin: 7.6 g/dL — ABNORMAL LOW (ref 12.0–15.0)
Immature Granulocytes: 0 %
Lymphocytes Relative: 60 %
Lymphs Abs: 1.9 10*3/uL (ref 0.7–4.0)
MCH: 33.5 pg (ref 26.0–34.0)
MCHC: 32.5 g/dL (ref 30.0–36.0)
MCV: 103.1 fL — ABNORMAL HIGH (ref 80.0–100.0)
Monocytes Absolute: 0.8 10*3/uL (ref 0.1–1.0)
Monocytes Relative: 26 %
Neutro Abs: 0.4 10*3/uL — CL (ref 1.7–7.7)
Neutrophils Relative %: 12 %
Platelets: 7 10*3/uL — CL (ref 150–400)
RBC: 2.27 MIL/uL — ABNORMAL LOW (ref 3.87–5.11)
RDW: 24.7 % — ABNORMAL HIGH (ref 11.5–15.5)
WBC: 3.2 10*3/uL — ABNORMAL LOW (ref 4.0–10.5)
nRBC: 1.2 % — ABNORMAL HIGH (ref 0.0–0.2)

## 2020-12-01 LAB — PHOSPHORUS: Phosphorus: 3 mg/dL (ref 2.5–4.6)

## 2020-12-01 LAB — HAPTOGLOBIN: Haptoglobin: 97 mg/dL (ref 33–346)

## 2020-12-01 LAB — MAGNESIUM: Magnesium: 2 mg/dL (ref 1.7–2.4)

## 2020-12-01 MED ORDER — CYANOCOBALAMIN 1000 MCG/ML IJ SOLN
1000.0000 ug | Freq: Every day | INTRAMUSCULAR | Status: DC
  Filled 2020-12-01: qty 1

## 2020-12-01 MED ORDER — SODIUM CHLORIDE 0.9% IV SOLUTION: Freq: Once | INTRAVENOUS | Status: AC

## 2020-12-01 MED ORDER — SODIUM CHLORIDE 0.9 % IV SOLN
INTRAVENOUS | Status: DC | PRN
  Administered 2020-12-01 (×2): 250 mL via INTRAVENOUS

## 2020-12-01 MED ORDER — LIP MEDEX EX OINT
TOPICAL_OINTMENT | CUTANEOUS | Status: DC | PRN
  Filled 2020-12-01: qty 7

## 2020-12-01 MED ORDER — FENTANYL CITRATE (PF) 100 MCG/2ML IJ SOLN
INTRAMUSCULAR | Status: AC
  Administered 2020-12-01: 12:00:00 50 ug
  Filled 2020-12-01: qty 2

## 2020-12-01 MED ORDER — NALOXONE HCL 0.4 MG/ML IJ SOLN
INTRAMUSCULAR | Status: AC
  Filled 2020-12-01: qty 1

## 2020-12-01 MED ORDER — FENTANYL CITRATE (PF) 100 MCG/2ML IJ SOLN: 50.0000 ug | Freq: Once | INTRAMUSCULAR | Status: AC

## 2020-12-01 NOTE — Progress Notes (Signed)
Patient ID: Wendy Edwards, female   DOB: 07/10/1966, 54 y.o.   MRN: [id number]  PROGRESS NOTE    Wendy Edwards  IWO:[id number] DOB: 19-Jun-1966 DOA: 11/29/2020 PCP: Patient, No Pcp Per   Brief Narrative:  54 yo female with PMH chronic alcohol abuse, cirrhosis w/o ascites, tobacco use, depression, HTN, GERD, COPD, pancytopenia, B12 deficiency who was last hospitalized in July 2021 for pancytopenia presented with weakness, dizziness, lightheadedness.  During her last hospitalization, her pancytopenia was attributed to alcohol abuse and B12 deficiency. She was going to get a BM biopsy but this was canceled as her presentation was considered secondary to alcohol abuse. She was strongly encouraged to quit drinking.  She was to follow up with hematology after discharge and did not do so.  On presentation during this admission, hemoglobin was 5, platelets of 13, alcohol level was less than 10.  CT head in the ER was negative for acute findings or bleed.  CT cervical spine was negative for acute fracture or abnormality.  Chest x-ray was clear.  Hematology was consulted.  Assessment & Plan:  Pancytopenia -seen by hematology last hospitalization; most likely all related to etoh use -Hematology following: Patient might need bone marrow biopsy -Platelets 7 today.  Will need platelet transfusion.  Follow-up hematology recommendation.  Monitor  Macrocytic anemia -Hemoglobin 5 on presentation.  Status post 2 units packed red cells transfusion on admission.  Hemoglobin 7.6 today.  Transfuse if hemoglobin is less than 7. -Folate 9.2 and vitamin B12 176 this admission  Vitamin B12 deficiency -Was 61 in July 2021; patient still drinks alcohol and does not take her oral meds at home -B12 during this hospitalization is 176 -Continue daily supplementation while in the hospital  Alcohol abuse -12 pack every 2-3 days; last drink 11/28/20 -CIWA protocol; currently no s/s withdrawal at this time -Continue  thiamine, multivitamin and folic acid  Elevated troponin -Probably from demand ischemia.  Troponins did not trend upwards.  Not a candidate for any invasive intervention or antiplatelets at this time.  Outpatient evaluation by cardiology  Cirrhosis of liver without ascites -Probably from alcohol abuse. -Outpatient evaluation and follow-up by GI  GERD -Continue PPI  COPD -Currently stable  Hypertension -Blood pressure intermittently elevated.  Monitor.  Tobacco abuse -Patient was counseled regarding tobacco cessation by admitting hospitalist.  Continue nicotine patch  Generalized deconditioning -Patient is very deconditioned and has been mostly bedbound for quite some time.  PT recommending home health PT.  Case management consulted -Consult palliative care for goals of care discussion   DVT prophylaxis: SCDs Code Status: Full Family Communication: None at bedside Disposition Plan: Status is: Inpatient  Remains inpatient appropriate because:Inpatient level of care appropriate due to severity of illness   Dispo: The patient is from: Home              Anticipated d/c is to: Home              Anticipated d/c date is: 3 days              Patient currently is not medically stable to d/c.  Consultants: Hematology.  Consult palliative care  Procedures: None  Antimicrobials: None   Subjective: Patient seen and examined at bedside.  Poor historian.  Denies any bleeding.  No overnight fever, vomiting, worsening shortness of breath reported.  Objective: Vitals:   11/30/20 1400 11/30/20 1748 11/30/20 2104 12/01/20 0519  BP: (!) 150/89 (!) 152/85 126/71 131/79  Pulse: 95 92 90  86  Resp:  '20 18 16  ' Temp: 98.5 F (36.9 C) 98.4 F (36.9 C) 98.5 F (36.9 C) 98.1 F (36.7 C)  TempSrc: Oral Oral Oral Oral  SpO2: 98% 100% 96% 98%  Weight:      Height:        Intake/Output Summary (Last 24 hours) at 12/01/2020 1048 Last data filed at 12/01/2020 0600 Gross per 24 hour   Intake 3078.12 ml  Output 2450 ml  Net 628.12 ml   Filed Weights   11/29/20 1932 11/30/20 0828  Weight: 54 kg 53.9 kg    Examination:  General exam: Looks chronically ill.  Poor historian.  No acute distress currently. Respiratory system: Bilateral decreased breath sounds at bases Cardiovascular system: S1 & S2 heard, Rate controlled Gastrointestinal system: Abdomen is nondistended, soft and nontender. Normal bowel sounds heard. Extremities: No cyanosis, clubbing; trace lower extremity edema Central nervous system: Alert and oriented. No focal neurological deficits. Moving extremities Skin: No rashes, lesions or ulcers Psychiatry: Extremely flat affect.  Poor historian.  Does not participate in conversation much.    Data Reviewed: I have personally reviewed following labs and imaging studies  CBC: Recent Labs  Lab 11/29/20 1315 11/30/20 0135 11/30/20 0612 12/01/20 0525  WBC 3.4*  --  2.9* 3.2*  NEUTROABS  --   --  0.6* 0.4*  HGB 5.0* 8.4* 8.0* 7.6*  HCT 15.6* 25.4* 24.0* 23.4*  MCV 120.0*  --  98.8 103.1*  PLT 13*  --  8* 7*   Basic Metabolic Panel: Recent Labs  Lab 11/29/20 1315 11/30/20 0612 12/01/20 0525  NA 138 136 140  K 3.9 3.0* 4.1  CL 104 108 112*  CO2 21* 18* 20*  GLUCOSE 109* 74 88  BUN 7 6 5*  CREATININE 0.44 0.38* 0.42*  CALCIUM 8.3* 6.9* 8.2*  MG  --  1.7 2.0  PHOS  --  2.8 3.0   GFR: Estimated Creatinine Clearance: 64.3 mL/min (A) (by C-G formula based on SCr of 0.42 mg/dL (L)). Liver Function Tests: Recent Labs  Lab 11/29/20 1623 11/30/20 0612 12/01/20 0525  AST '29 25 19  ' ALT '21 19 16  ' ALKPHOS 64 60 58  BILITOT 0.5 0.9 0.3  PROT 6.1* 5.7* 5.7*  ALBUMIN 3.4* 2.9* 3.0*   No results for input(s): LIPASE, AMYLASE in the last 168 hours. No results for input(s): AMMONIA in the last 168 hours. Coagulation Profile: Recent Labs  Lab 11/29/20 1500  INR 1.3*   Cardiac Enzymes: No results for input(s): CKTOTAL, CKMB, CKMBINDEX,  TROPONINI in the last 168 hours. BNP (last 3 results) No results for input(s): PROBNP in the last 8760 hours. HbA1C: No results for input(s): HGBA1C in the last 72 hours. CBG: No results for input(s): GLUCAP in the last 168 hours. Lipid Profile: No results for input(s): CHOL, HDL, LDLCALC, TRIG, CHOLHDL, LDLDIRECT in the last 72 hours. Thyroid Function Tests: Recent Labs    11/30/20 0612  TSH 1.332   Anemia Panel: Recent Labs    11/29/20 1600  VITAMINB12 176*  FOLATE 9.2  FERRITIN 103  TIBC 384  IRON 275*  RETICCTPCT 2.5   Sepsis Labs: No results for input(s): PROCALCITON, LATICACIDVEN in the last 168 hours.  Recent Results (from the past 240 hour(s))  Resp Panel by RT-PCR (Flu A&B, Covid) Nasopharyngeal Swab     Status: None   Collection Time: 11/29/20  1:33 PM   Specimen: Nasopharyngeal Swab; Nasopharyngeal(NP) swabs in vial transport medium  Result Value Ref Range  Status   SARS Coronavirus 2 by RT PCR NEGATIVE NEGATIVE Final    Comment: (NOTE) SARS-CoV-2 target nucleic acids are NOT DETECTED.  The SARS-CoV-2 RNA is generally detectable in upper respiratory specimens during the acute phase of infection. The lowest concentration of SARS-CoV-2 viral copies this assay can detect is 138 copies/mL. A negative result does not preclude SARS-Cov-2 infection and should not be used as the sole basis for treatment or other patient management decisions. A negative result may occur with  improper specimen collection/handling, submission of specimen other than nasopharyngeal swab, presence of viral mutation(s) within the areas targeted by this assay, and inadequate number of viral copies(<138 copies/mL). A negative result must be combined with clinical observations, patient history, and epidemiological information. The expected result is Negative.  Fact Sheet for Patients:  EntrepreneurPulse.com.au  Fact Sheet for Healthcare Providers:   IncredibleEmployment.be  This test is no t yet approved or cleared by the [place] FDA and  has been authorized for detection and/or diagnosis of SARS-CoV-2 by FDA under an Emergency Use Authorization (EUA). This EUA will remain  in effect (meaning this test can be used) for the duration of the COVID-19 declaration under Section 564(b)(1) of the Act, 21 U.S.C.section 360bbb-3(b)(1), unless the authorization is terminated  or revoked sooner.       Influenza A by PCR NEGATIVE NEGATIVE Final   Influenza B by PCR NEGATIVE NEGATIVE Final    Comment: (NOTE) The Xpert Xpress SARS-CoV-2/FLU/RSV plus assay is intended as an aid in the diagnosis of influenza from Nasopharyngeal swab specimens and should not be used as a sole basis for treatment. Nasal washings and aspirates are unacceptable for Xpert Xpress SARS-CoV-2/FLU/RSV testing.  Fact Sheet for Patients: EntrepreneurPulse.com.au  Fact Sheet for Healthcare Providers: IncredibleEmployment.be  This test is not yet approved or cleared by the [place] FDA and has been authorized for detection and/or diagnosis of SARS-CoV-2 by FDA under an Emergency Use Authorization (EUA). This EUA will remain in effect (meaning this test can be used) for the duration of the COVID-19 declaration under Section 564(b)(1) of the Act, 21 U.S.C. section 360bbb-3(b)(1), unless the authorization is terminated or revoked.  Performed at [facility], [place] [address], [place], [place] [address]          Radiology Studies: CT Head Wo Contrast  Result Date: 11/29/2020 CLINICAL DATA:  Pt reports she fell in her parking lot week or so ago and was seen here. Reports left arm numbness since fall. EXAM: CT HEAD WITHOUT CONTRAST CT CERVICAL SPINE WITHOUT CONTRAST TECHNIQUE: Multidetector CT imaging of the head and cervical spine was performed following the standard protocol  without intravenous contrast. Multiplanar CT image reconstructions of the cervical spine were also generated. COMPARISON:  CT head and neck 06/25/2020 FINDINGS: CT HEAD FINDINGS Brain: No evidence of acute infarction, hemorrhage, hydrocephalus, extra-axial collection or mass lesion/mass effect. 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 The Vascular: No hyperdense vessel or unexpected calcification. Skull: Normal. Negative for fracture or focal lesion. Sinuses/Orbits: No acute finding. Other: None. CT CERVICAL SPINE FINDINGS Alignment: Normal. Skull base and vertebrae: No acute fracture. No primary bone lesion or focal pathologic process. Soft tissues and spinal canal: No prevertebral fluid or swelling. No visible canal hematoma. Disc levels:  No impingement identified. Upper chest: Negative. Other: None. IMPRESSION: 1. No acute intracranial process. 2. 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 3. No  acute fracture or subluxation of the cervical spine. Electronically Signed   By: Audie Pinto M.D.   On: 11/29/2020 14:34   CT Cervical Spine Wo Contrast  Result Date: 11/29/2020 CLINICAL DATA:  Pt reports she fell in her parking lot week or so ago and was seen here. Reports left arm numbness since fall. EXAM: CT HEAD WITHOUT CONTRAST CT CERVICAL SPINE WITHOUT CONTRAST TECHNIQUE: Multidetector CT imaging of the head and cervical spine was performed following the standard protocol without intravenous contrast. Multiplanar CT image reconstructions of the cervical spine were also generated. COMPARISON:  CT head and neck 06/25/2020 FINDINGS: CT HEAD FINDINGS Brain: No evidence of acute infarction, hemorrhage, hydrocephalus, extra-axial collection or mass lesion/mass effect. 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 The  Vascular: No hyperdense vessel or unexpected calcification. Skull: Normal. Negative for fracture or focal lesion. Sinuses/Orbits: No acute finding. Other: None. CT CERVICAL SPINE FINDINGS Alignment: Normal. Skull base and vertebrae: No acute fracture. No primary bone lesion or focal pathologic process. Soft tissues and spinal canal: No prevertebral fluid or swelling. No visible canal hematoma. Disc levels:  No impingement identified. Upper chest: Negative. Other: None. IMPRESSION: 1. No acute intracranial process. 2. 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 3. No acute fracture or subluxation of the cervical spine. Electronically Signed   By: Audie Pinto M.D.   On: 11/29/2020 14:34   DG Chest Port 1 View  Result Date: 11/29/2020 CLINICAL DATA:  Chest pain, fall EXAM: PORTABLE CHEST 1 VIEW COMPARISON:  08/02/2019 FINDINGS: The heart size and mediastinal contours are stable. Atherosclerotic calcification of the aortic knob. Both lungs are clear. The visualized skeletal structures are unremarkable. IMPRESSION: No active disease. Electronically Signed   By: Davina Poke D.O.   On: 11/29/2020 14:39   DG Hand Complete Left  Result Date: 11/30/2020 CLINICAL DATA:  Left hand pain following fall, initial encounter EXAM: LEFT HAND - COMPLETE 3+ VIEW COMPARISON:  None. FINDINGS: No acute fracture or dislocation is noted. Mild osteopenia is seen. No soft tissue abnormality is noted. IMPRESSION: No acute abnormality seen. Electronically Signed   By: Inez Catalina M.D.   On: 11/30/2020 14:33        Scheduled Meds: . sodium chloride   Intravenous Once  . cyanocobalamin  1,000 mcg Subcutaneous Daily  . folic acid  1 mg Oral Daily  . multivitamin with minerals  1 tablet Oral Daily  . nicotine  21 mg Transdermal Daily  . pantoprazole  40 mg Oral BID AC  . senna-docusate  1 tablet Oral Daily  . sodium chloride flush  3 mL Intravenous  Q12H   Continuous Infusions: . thiamine injection 500 mg (12/01/20 0518)   Followed by  . [START ON 12/03/2020] thiamine injection            Aline August, MD [facility] 12/01/2020, 10:48 AM

## 2020-12-01 NOTE — Progress Notes (Signed)
Patient ID: Wendy Edwards, female   DOB: [birth year], 54 y.o.   MRN: [id number] Patient scheduled for CT-guided bone marrow biopsy today to evaluate for pancytopenia.Risks and benefits of procedure was discussed with the patient  including, but not limited to bleeding, infection, damage to adjacent structures or low yield requiring additional tests.  All of the questions were answered and there is agreement to proceed.  Consent signed and in chart.  Patient ate breakfast this morning therefore only IV fentanyl and local anesthesia will be used for case.

## 2020-12-01 NOTE — Procedures (Signed)
Interventional Radiology Procedure Note  Procedure: CT guided aspirate and core biopsy of right posterior iliac bone Complications: None Recommendations: - Bedrest supine x 1 hrs - OTC's PRN  Pain - Follow biopsy results - ok for diet per primary order  Signed,  Dulcy Fanny. Earleen Newport, DO

## 2020-12-01 NOTE — Progress Notes (Signed)
Notified MD on call about critical value of neut# of 0.4.

## 2020-12-01 NOTE — Plan of Care (Signed)
  Problem: Education: Goal: Knowledge of General Education information will improve Description: Including pain rating scale, medication(s)/side effects and non-pharmacologic comfort measures Outcome: Progressing   Problem: Clinical Measurements: Goal: Respiratory complications will improve Outcome: Progressing Goal: Cardiovascular complication will be avoided Outcome: Progressing   Problem: Nutrition: Goal: Adequate nutrition will be maintained Outcome: Progressing   Problem: Coping: Goal: Level of anxiety will decrease Outcome: Progressing   Problem: Elimination: Goal: Will not experience complications related to urinary retention Outcome: Progressing   Problem: Safety: Goal: Ability to remain free from injury will improve Outcome: Progressing   Problem: Skin Integrity: Goal: Risk for impaired skin integrity will decrease Outcome: Progressing

## 2020-12-01 NOTE — Progress Notes (Signed)
OT Cancellation Note  Patient Details Name: MARABETH MELLAND MRN: [id number] DOB: November 14, 1966   Cancelled Treatment:    Reason Eval/Treat Not Completed: Medical issues which prohibited therapy;Patient at procedure or test/ unavailable:  Spoke with RN-pt off floor for bone marrow biopsy and then will require platelets due to critical platelet level of 7.  Will hold Occupational Therapy evaluation for today and continue efforts.  Julien Girt 12/01/2020, 12:17 PM

## 2020-12-01 NOTE — Progress Notes (Signed)
Notified MD on call about critical value of platelets being 7.

## 2020-12-01 NOTE — Progress Notes (Addendum)
Initial Nutrition Assessment  DOCUMENTATION CODES:   Non-severe (moderate) malnutrition in context of social or environmental circumstances  INTERVENTION:   Boost Breeze po BID, each supplement provides 250 kcal and 9 grams of protein  MVI with minerals   Monitor chewing ability without dentures.   NUTRITION DIAGNOSIS:   Moderate Malnutrition related to social / environmental circumstances as evidenced by severe muscle depletion,moderate muscle depletion,mild fat depletion.   GOAL:   Patient will meet greater than or equal to 90% of their needs    MONITOR:   PO intake,Supplement acceptance,Labs,Weight trends  REASON FOR ASSESSMENT:   Consult Assessment of nutrition requirement/status  ASSESSMENT:    Pt is a 54 y.o. female with PMH of chronic alcohol abuse, cirrhosis w/o ascites, depression, HTN, GERD, COPD, pancytopenia, and B12 deficiency. She was last hospitalized July 2021 for pancytopenia secondary to alcohol abuse and B12 deficiency. Pt is bedbound. Reports using a walker only to walk to bathroom and back in home.   Spoke with pt at bedside. Pt reports good appetite at home and during this admission. Pt reports that she lives with her son and his girlfriend who provide all the meals. Typical intake: B: cereal or bacon, eggs, and toast, L: hotdog or [place] sandwich, D: TV dinner. Pt reports usually consuming two meals a day, which is normal for pt. Pt was hungry during RD visit and was waiting for dinner.   Pt reports alcohol use of six 12 oz beers in a week. Pt denies drinking any other alcohol products besides beer. Pt denies feeling full after alcohol consumption and said she will consume her alcohol with food. Pt said she also consumes water and diet Pepsi or Coke at home. Pt reports cutting back on cigarette smoking. Pt reports smoking 3 cigarettes per day, or one pack a week recently. She mentioned that she previously would smoke 2-3 packs a week.   Pt denied taking  any medications besides Ibuprofen and Goody's powder for pain relief. She denies taking any supplements.     Pt stated she has dentures but they are at home. Pt reports no issues with the fit of her dentures. She stated that family could bring them if needed.   Pt stated she has lost about 10 lbs within the last month and a half. Pt stated her UBW is around 135 lbs. Per chart review, pt has experienced a 3.3 lb weight loss since her last admission in July. Pt has experienced a gradual decline in weight since January 2019.   Pt stated her mobility is limited. She is primarily bed bound. She will ambulate to the bathroom and kitchen with assistance of a walker but that is all. Pt mentioned she recently fell in her apartment complex parking lot and has been fearful of walking very much outside the home since this event.   Labs reviewed.   Medications reviewed and include: Vitamin B12, MVI with minerals, Protonix, Folvite, Senokot-S, NS at 10 mL/hr, thiamine in NS at 100 mL/hr   NUTRITION - FOCUSED PHYSICAL EXAM:  Flowsheet Row Most Recent Value  Orbital Region Mild depletion  Upper Arm Region Mild depletion  Thoracic and Lumbar Region No depletion  Buccal Region No depletion  Temple Region No depletion  Clavicle Bone Region Moderate depletion  Clavicle and Acromion Bone Region Moderate depletion  Scapular Bone Region Unable to assess  Dorsal Hand Severe depletion  Patellar Region Severe depletion  Anterior Thigh Region Severe depletion  Posterior Calf Region Severe depletion  Edema (RD Assessment) None  Hair Reviewed  Eyes Reviewed  Mouth Reviewed  Skin Reviewed  Nails Reviewed       Diet Order:   Diet Order            Diet regular Room service appropriate? Yes; Fluid consistency: Thin  Diet effective now                 EDUCATION NEEDS:   No education needs have been identified at this time  Skin:  Skin Assessment: Reviewed RN Assessment  Last BM:   12/01/20  Height:   Ht Readings from Last 1 Encounters:  11/30/20 5\' 2"  (1.575 m)    Weight:   Wt Readings from Last 1 Encounters:  11/30/20 53.9 kg    Ideal Body Weight:  50 kg  BMI:  Body mass index is 21.73 kg/m.  Estimated Nutritional Needs:   Kcal:  1700-1900  Protein:  85-95 grams  Fluid:  >1.7 L/day    Ronnald Nian, Dietetic Intern Pager: [phone] If unavailable: [phone]

## 2020-12-01 NOTE — TOC Progression Note (Signed)
Transition of Care [facility]) - Progression Note    Patient Details  Name: EVALINE WALTMAN MRN: [id number] Date of Birth: 03-Sep-1966  Transition of Care [facility]) CM/SW Contact  Samoria Fedorko, Juliann Pulse, RN Phone Number: 12/01/2020, 10:19 AM  Clinical Narrative:patient is bed bound-No HHC appropriate-No HHC to accept;PCP list provided;[facility] following.      Expected Discharge Plan: Home/Self Care Barriers to Discharge: Continued Medical Work up  Expected Discharge Plan and Services Expected Discharge Plan: Home/Self Care   Discharge Planning Services: CM Consult Post Acute Care Choice: [place] arrangements for the past 2 months: Single Family Home                                       Social Determinants of Health (SDOH) Interventions    Readmission Risk Interventions No flowsheet data found.

## 2020-12-01 NOTE — Consult Note (Signed)
   [facility] CM Inpatient Consult   12/01/2020  Wendy Edwards November 05, 1966 [id number]   Referral received from inpatient [facility] for possible [place] services for assistance with outpatient chronic disease management and assistance with community needs.   Per chart review, patient does not have primary care provider listed. Will continue to follow for progression at this time.  Of note, [facility] Care Management services does not replace or interfere with any services that are arranged by inpatient case management or social work.  Netta Cedars, MSN, [facility] Liaison Nurse Mobile Phone [phone]  Toll free office [phone]

## 2020-12-01 NOTE — Consult Note (Signed)
Consultation Note Date: 12/01/2020   Patient Name: Wendy Edwards  DOB: Jul 09, 1966  MRN: [id number]  Age / Sex: 54 y.o., female  PCP: Patient, No Pcp Per Referring Physician: Aline August, MD  Reason for Consultation: Establishing goals of care  HPI/Patient Profile: 54 y.o. female  with past medical history of aplastic anemia history, COPD, headaches, hypertension, DVT, anxiety, depression admitted on 11/29/2020 with weakness, chest pain, dizziness after recent fall. Found to have pancytopenia with concern for alcohol abuse as cause. Work up undergoing with hematology. S/P bone marrow biopsy 12/01/20.   Clinical Assessment and Goals of Care: I met today at [place] bedside. No family or visitors present. Wendy Edwards is lying in bed and complains of being hungry (she is NPO for ultrasound). She has many questions about her blood counts and why she is having the problems she has been having. We discussed that this is the reasoning behind all the testing of her bone marrow and ultrasound to make sure we are checking all avenues of concern that could be leading to her health complications. However, we are concerned that her liver cirrhosis is a contributing factor and potentially the underlying cause of her problems. Wendy Edwards was surprised to learn that she has cirrhosis and tells me that nobody has ever shared this with her before. We reviewed chart with evidence of changes to the liver on ultrasound back in 2019 but cirrhosis identified on ultrasound and listed in discharge summary during her July 2021 admission. I explained that this is why everyone continues to discuss with her about abstaining from all alcohol because her liver disease will only worsen significantly even with small amounts of alcohol. Wendy Edwards reports that she only drinks 3 beers no more than every other day.   Wendy Edwards and I further discussed concern that she  is physically weak and debilitated. She is mostly in the bed at home and unable to obtain reasoning behind her weakness and why she is so inactive. She shares that she does get up at times and reports she walks with a cane but her family bring her meals to her and she uses bedside commode. She tells me that she is afraid to leave her home because of COVID.   During our conversation Wendy Edwards endorses multiple times "I want to live" and verbalizes that she is willing to whatever needed to optimize her health. Her goals are for full aggressive care at this time. She has poor understanding of her medical issues and I worry that she is forgetful as well and poor historian. She verbalizes willingness to abstain from alcohol, open to therapy and exercises, and to follow up with her medical providers to help her with her goals.   All questions/concerns addressed. Emotional support provided.   Primary Decision Maker PATIENT    SUMMARY OF RECOMMENDATIONS   - Outpatient palliative to follow up  Code Status/Advance Care Planning:  Full code   Symptom Management:   Per attending and hematology.   Palliative Prophylaxis:   Delirium Protocol  Additional  Recommendations (Limitations, Scope, Preferences):  Full Scope Treatment  Psycho-social/Spiritual:   Desire for further Chaplaincy support:yes  Additional Recommendations: Caregiving  Support/Resources  Prognosis:   Unable to determine  Discharge Planning: Home with Palliative Services      Primary Diagnoses: Present on Admission: . Pancytopenia ([place]) . Cirrhosis of liver without ascites ([place]) . COPD (chronic obstructive pulmonary disease) ([place]) . ETOH abuse . HTN (hypertension) . GERD (gastroesophageal reflux disease) . Malnutrition ([place]) . Thrombocytopenia ([place]) . Tobacco abuse . Vitamin B12 deficiency . Fall   I have reviewed the medical record, interviewed the patient and family, and examined the patient. The following  aspects are pertinent.  Past Medical History:  Diagnosis Date  . 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 Aplastic anemia ([place])    hx/notes 05/09/2007  . Arthritis    "right knee" (02/21/2017)  . Asthma   . Chronic anxiety    Archie Endo 02/04/2016  . COPD (chronic obstructive pulmonary disease) ([place])    hx/notes 05/09/2007  . Daily headache   . Depression   . DVT (deep venous thrombosis) ([facility])    hx upper and lower extremeties/notes 05/09/2007  . GERD (gastroesophageal reflux disease)   . Goals of care, counseling/discussion 07/05/2020  . Heart murmur 1974  . History of blood transfusion    "related to my aplastic anemia"  . History of kidney stones   . Hypertension   . Migraine    "a few/year" (02/21/2017)  . Pneumonia 1974   hospitalized "w/double pneumonia"  . Serum sickness    hx/notes 05/09/2007  . Transaminitis    asymptomatic hx/notes 05/09/2007   Social History   Socioeconomic History  . Marital status: Legally Separated    Spouse name: Not on file  . Number of children: 3  . Years of education: Not on file  . Highest education level: Not on file  Occupational History  . Occupation: disabled    Fish farm manager: UNEMPLOYED  Tobacco Use  . Smoking status: Current Every Day Smoker    Packs/day: 0.50    Years: 34.00    Pack years: 17.00    Types: Cigarettes  . Smokeless tobacco: Never Used  Vaping Use  . Vaping Use: Some days  Substance and Sexual Activity  . Alcohol use: Yes    Alcohol/week: 12.0 standard drinks    Types: 12 Cans of beer per week    Comment: previously a heavy drinker - 6-7 beers daily; last drank maybe a week ago  . Drug use: No  . Sexual activity: Not Currently  Other Topics Concern  . Not on file  Social History Narrative   Lives with brother.   Social Determinants of Health   Financial Resource Strain: Not on file  Food Insecurity: Not on file  Transportation Needs:  Not on file  Physical Activity: Not on file  Stress: Not on file  Social Connections: Not on file   Family History  Problem Relation Age of Onset  . Coronary artery disease Father 34       CABG  . Cancer Father   . Hypertension Mother   . Cancer Mother    Scheduled Meds: . [START ON 12/02/2020] cyanocobalamin  1,000 mcg Intramuscular Daily  . folic acid  1 mg Oral Daily  . multivitamin with minerals  1 tablet Oral Daily  . nicotine  21 mg Transdermal Daily  . pantoprazole  40 mg Oral BID AC  . senna-docusate  1 tablet Oral Daily  . sodium chloride flush  3 mL Intravenous Q12H   Continuous Infusions: . sodium chloride 250 mL (12/01/20 1446)  . thiamine injection 500 mg (12/01/20 1448)   Followed by  . [START ON 12/03/2020] thiamine injection     PRN Meds:.sodium chloride, acetaminophen, hydrALAZINE, labetalol, LORazepam **OR** LORazepam, ondansetron **OR** ondansetron (ZOFRAN) IV Allergies  Allergen Reactions  . Bee Venom Hives  . Phenergan [Promethazine] Other (See Comments)    "made me shaky"   Review of Systems  Constitutional: Positive for activity change and fatigue. Negative for appetite change.  Respiratory: Negative for shortness of breath.   Neurological: Positive for weakness.    Physical Exam Vitals and nursing note reviewed.  Constitutional:      General: She is not in acute distress.    Appearance: She is ill-appearing.  Cardiovascular:     Rate and Rhythm: Normal rate.  Pulmonary:     Effort: Pulmonary effort is normal. No tachypnea, accessory muscle usage or respiratory distress.  Abdominal:     General: Abdomen is flat.     Palpations: Abdomen is soft.  Neurological:     Mental Status: She is alert and oriented to person, place, and time.  Psychiatric:     Comments: Potentially poor memory     Vital Signs: BP (!) 175/88   Pulse 92   Temp 98.2 F (36.8 C) (Oral)   Resp 20   Ht '5\' 2"'  (1.575 m)   Wt 53.9 kg   SpO2 99%   BMI 21.73 kg/m   Pain Scale: 0-10   Pain Score: 5    SpO2: SpO2: 99 % O2 Device:SpO2: 99 % O2 Flow Rate: .   IO: Intake/output summary:   Intake/Output Summary (Last 24 hours) at 12/01/2020 1600 Last data filed at 12/01/2020 1525 Gross per 24 hour  Intake 2705.04 ml  Output 1400 ml  Net 1305.04 ml    LBM: Last BM Date:  (Pt states probably a week ago) Baseline Weight: Weight: 54 kg Most recent weight: Weight: 53.9 kg     Palliative Assessment/Data:     Time In: 1400 Time Out: 1450 Time Total: 50 min Greater than 50%  of this time was spent counseling and coordinating care related to the above assessment and plan.  Signed by: Vinie Sill, NP Palliative Medicine Team Pager # [phone] (M-F 8a-5p) Team Phone # [phone] (Nights/Weekends)

## 2020-12-02 LAB — CBC WITH DIFFERENTIAL/PLATELET
Abs Immature Granulocytes: 0 10*3/uL (ref 0.00–0.07)
Basophils Absolute: 0.1 10*3/uL (ref 0.0–0.1)
Basophils Relative: 2 %
Eosinophils Absolute: 0 10*3/uL (ref 0.0–0.5)
Eosinophils Relative: 1 %
HCT: 23.6 % — ABNORMAL LOW (ref 36.0–46.0)
Hemoglobin: 7.5 g/dL — ABNORMAL LOW (ref 12.0–15.0)
Lymphocytes Relative: 52 %
Lymphs Abs: 2.1 10*3/uL (ref 0.7–4.0)
MCH: 33 pg (ref 26.0–34.0)
MCHC: 31.8 g/dL (ref 30.0–36.0)
MCV: 104 fL — ABNORMAL HIGH (ref 80.0–100.0)
Monocytes Absolute: 1.2 10*3/uL — ABNORMAL HIGH (ref 0.1–1.0)
Monocytes Relative: 31 %
Neutro Abs: 0.6 10*3/uL — ABNORMAL LOW (ref 1.7–7.7)
Neutrophils Relative %: 14 %
Platelets: 129 10*3/uL — ABNORMAL LOW (ref 150–400)
RBC: 2.27 MIL/uL — ABNORMAL LOW (ref 3.87–5.11)
RDW: 24.3 % — ABNORMAL HIGH (ref 11.5–15.5)
WBC: 4 10*3/uL (ref 4.0–10.5)
nRBC: 0.7 % — ABNORMAL HIGH (ref 0.0–0.2)

## 2020-12-02 LAB — BPAM PLATELET PHERESIS
Blood Product Expiration Date: 202112162359
Blood Product Expiration Date: 202112162359
ISSUE DATE / TIME: 202112141237
ISSUE DATE / TIME: 202112141635
Unit Type and Rh: 5100
Unit Type and Rh: 5100

## 2020-12-02 LAB — COMPREHENSIVE METABOLIC PANEL
ALT: 16 U/L (ref 0–44)
AST: 19 U/L (ref 15–41)
Albumin: 3.2 g/dL — ABNORMAL LOW (ref 3.5–5.0)
Alkaline Phosphatase: 62 U/L (ref 38–126)
Anion gap: 11 (ref 5–15)
BUN: 7 mg/dL (ref 6–20)
CO2: 22 mmol/L (ref 22–32)
Calcium: 8.5 mg/dL — ABNORMAL LOW (ref 8.9–10.3)
Chloride: 108 mmol/L (ref 98–111)
Creatinine, Ser: 0.45 mg/dL (ref 0.44–1.00)
GFR, Estimated: >60 mL/min (ref >60)
Glucose, Bld: 97 mg/dL (ref 70–99)
Potassium: 3.6 mmol/L (ref 3.5–5.1)
Sodium: 141 mmol/L (ref 135–145)
Total Bilirubin: 0.4 mg/dL (ref 0.3–1.2)
Total Protein: 5.8 g/dL — ABNORMAL LOW (ref 6.5–8.1)

## 2020-12-02 LAB — PREPARE PLATELET PHERESIS
Unit division: 0
Unit division: 0

## 2020-12-02 LAB — PHOSPHORUS: Phosphorus: 3.8 mg/dL (ref 2.5–4.6)

## 2020-12-02 LAB — MAGNESIUM: Magnesium: 1.7 mg/dL (ref 1.7–2.4)

## 2020-12-02 MED ORDER — PANTOPRAZOLE SODIUM 40 MG PO TBEC: 40.0000 mg | DELAYED_RELEASE_TABLET | Freq: Every day | ORAL | 0 refills | Status: DC

## 2020-12-02 MED ORDER — THIAMINE HCL 100 MG PO TABS: 100.0000 mg | ORAL_TABLET | Freq: Every day | ORAL | 0 refills | Status: DC

## 2020-12-02 MED ORDER — UMECLIDINIUM BROMIDE 62.5 MCG/INH IN AEPB: 1.0000 | INHALATION_SPRAY | Freq: Every day | RESPIRATORY_TRACT | 0 refills | Status: DC

## 2020-12-02 MED ORDER — MOMETASONE FURO-FORMOTEROL FUM 100-5 MCG/ACT IN AERO: 2.0000 | INHALATION_SPRAY | Freq: Two times a day (BID) | RESPIRATORY_TRACT | 0 refills | Status: DC

## 2020-12-02 MED ORDER — VITAMIN B-12 1000 MCG PO TABS: 2000.0000 ug | ORAL_TABLET | Freq: Every day | ORAL | 0 refills | Status: DC

## 2020-12-02 MED ORDER — FOLIC ACID 1 MG PO TABS: 1.0000 mg | ORAL_TABLET | Freq: Every day | ORAL | 0 refills | Status: DC

## 2020-12-02 NOTE — TOC Transition Note (Signed)
Transition of Care [facility]) - CM/SW Discharge Note   Patient Details  Name: Wendy Edwards MRN: [id number] Date of Birth: 1966/05/19  [facility] CM/SW Contact:  Dessa Phi, RN Phone Number: 12/02/2020, 9:50 AM   Clinical Narrative:  D/c home. No further CM notes. See prior CM note.     Final next level of care: Home/Self Care Barriers to Discharge: No Barriers Identified   Patient Goals and CMS Choice Patient states their goals for this hospitalization and ongoing recovery are:: go home CMS Medicare.gov Compare Post Acute Care list provided to:: Patient Represenative (must comment) [name] son [phone]) Choice offered to / list presented to : Adult Children  Discharge Placement                       Discharge Plan and Services   Discharge Planning Services: CM Consult Post Acute Care Choice: Home Health                               Social Determinants of Health (SDOH) Interventions     Readmission Risk Interventions No flowsheet data found.

## 2020-12-02 NOTE — Discharge Summary (Signed)
Physician Discharge Summary  Wendy Edwards MEQ:[id number] DOB: [birth year] DOA: 11/29/2020  PCP: Patient, No Pcp Per  Admit date: 11/29/2020 Discharge date: 12/02/2020  Admitted From: Home Disposition: Home  Recommendations for Outpatient Follow-up:  1. Follow up with PCP in 1 week with repeat CBC/BMP 2. Outpatient follow-up with hematology 3. Recommend outpatient evaluation and follow-up with GI 4. Recommend outpatient evaluation and follow-up by for palliative care goals of care discussion 5. Abstain from alcohol 6. 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  During her last hospitalization, her pancytopenia was attributed to alcohol abuse and B12 deficiency. She was going to get a BM biopsy but this was canceled as her presentation was considered secondary to alcohol abuse. She was strongly encouraged to quit drinking.  She was to follow up with hematology after discharge and did not do so.  On presentation during this admission, hemoglobin was 5, platelets of 13, alcohol level was less than 10.  CT head in the ER was negative for acute findings or bleed.  CT cervical spine was negative for acute fracture or abnormality.  Chest x-ray was clear.  Hematology was consulted.  During the hospitalization, she had platelet transfusion and underwent bone marrow biopsy by IR on 12/01/2020. Subsequently, her platelets have improved. Bone marrow biopsy can be followed up as an outpatient by oncology. She will be discharged home today and she needs to be compliant with medications, follow-up and abstain  from alcohol.  Discharge Diagnoses:   Pancytopenia -seen by hematology last hospitalization; most likely all related to etoh use -Platelets 7 today on 12/01/2020.   She underwent  platelet transfusion and underwent bone marrow biopsy by IR on 12/01/2020. Subsequently, her platelets have improved, 129 today. Bone marrow biopsy can be followed up as an outpatient by oncology. Oncology has cleared the patient for discharge.  Macrocytic anemia -Hemoglobin 5 on presentation.  Status post 2 units packed red cells transfusion on admission.  Hemoglobin 7.5 today.   Outpatient follow-up. -Folate 9.2 and vitamin B12 176 this admission  Vitamin B12 deficiency -Was 51 in July 2021; patient still drinks alcohol and does not take her oral meds at home -B12 during this hospitalization is 176 -Continue oral supplementation on discharge and patient can follow-up with Dr. Antonieta Pert office for B12 shots.  Alcohol abuse -12 pack every 2-3 days; last drink 11/28/20 -CIWA protocol; currently no s/s withdrawal at this time -Continue thiamine, multivitamin and folic acid. Consult regarding alcohol abstinence.  Elevated troponin -Probably from demand ischemia.  Troponins did not trend upwards.  Not a candidate for any invasive intervention or antiplatelets at this time.  Outpatient evaluation by cardiology if needed.  Cirrhosis of liver without ascites -Probably from alcohol abuse. -Outpatient evaluation and follow-up by GI  GERD -Continue PPI  COPD -Currently stable  Hypertension -Blood pressure intermittently elevated.  Monitor.  Tobacco abuse -Patient was counseled regarding tobacco cessation by admitting hospitalist.    Generalized deconditioning -Patient is very deconditioned and has been mostly bedbound for quite some time. -palliative care for goals of care discussion is pending. This can happen as an outpatient.   Discharge Instructions  Discharge Instructions  Ambulatory  referral to Hematology / Oncology   Complete by: As directed    Hospital followup   Diet general   Complete by: As directed    Increase activity slowly   Complete by: As directed    No wound care   Complete by: As directed      Allergies as of 12/02/2020      Reactions   Bee Venom Hives   Phenergan [promethazine] Other (See Comments)   "made me shaky"      Medication List    STOP taking these medications   ALPRAZolam 1 MG tablet Commonly known as: XANAX   cyanocobalamin 1000 MCG/ML injection Commonly known as: (VITAMIN B-12) Replaced by: vitamin B-12 1000 MCG tablet     TAKE these medications   acetaminophen 500 MG tablet Commonly known as: TYLENOL Take 1,000 mg by mouth every 6 (six) hours as needed for mild pain.   B-D SYRINGE/NEEDLE 1CC/25GX5/8 25G X 5/8" 1 ML Misc Generic drug: SYRINGE/NEEDLE (DISP) 1 ML 1,000 mcg by Does not apply route daily. 1cc BD syringe for vitamin B12 shots.   folic acid 1 MG tablet Commonly known as: FOLVITE Take 1 tablet (1 mg total) by mouth daily.   mometasone-formoterol 100-5 MCG/ACT Aero Commonly known as: DULERA Inhale 2 puffs into the lungs 2 (two) times daily.   multivitamin with minerals Tabs tablet Take 1 tablet by mouth daily.   pantoprazole 40 MG tablet Commonly known as: PROTONIX Take 1 tablet (40 mg total) by mouth daily at 6 (six) AM.   ProAir HFA 108 (90 Base) MCG/ACT inhaler Generic drug: albuterol INHALE 2 PUFFS BY MOUTH EVERY 6 HOURS AS NEEDED What changed:   how much to take  reasons to take this  Another medication with the same name was removed. Continue taking this medication, and follow the directions you see here.   thiamine 100 MG tablet Take 1 tablet (100 mg total) by mouth daily.   umeclidinium bromide 62.5 MCG/INH Aepb Commonly known as: INCRUSE ELLIPTA Inhale 1 puff into the lungs daily.   vitamin B-12 1000 MCG tablet Commonly known as: CYANOCOBALAMIN Take 2 tablets (2,000 mcg total) by  mouth daily. Replaces: cyanocobalamin 1000 MCG/ML injection       Follow-up Information    PCP. Schedule an appointment as soon as possible for a visit in 1 week(s).   Why: with repeat cbc/bmp              Allergies  Allergen Reactions  . Bee Venom Hives  . Phenergan [Promethazine] Other (See Comments)    "made me shaky"    Consultations:  Oncology/IR. Palliative care evaluation pending   Procedures/Studies: CT Head Wo Contrast  Result Date: 11/29/2020 CLINICAL DATA:  Pt reports she fell in her parking lot week or so ago and was seen here. Reports left arm numbness since fall. EXAM: CT HEAD WITHOUT CONTRAST CT CERVICAL SPINE WITHOUT CONTRAST TECHNIQUE: Multidetector CT imaging of the head and cervical spine was performed following the standard protocol without intravenous contrast. Multiplanar CT image reconstructions of the cervical spine were also generated. COMPARISON:  CT head and neck 06/25/2020 FINDINGS: CT HEAD FINDINGS Brain: No evidence of acute infarction, hemorrhage, hydrocephalus, extra-axial collection or mass lesion/mass effect. 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 The Vascular: No hyperdense vessel or unexpected calcification. Skull: Normal. Negative for fracture or focal lesion. Sinuses/Orbits: No acute finding. Other: None. CT CERVICAL SPINE FINDINGS Alignment: Normal. Skull  base and vertebrae: No acute fracture. No primary bone lesion or focal pathologic process. Soft tissues and spinal canal: No prevertebral fluid or swelling. No visible canal hematoma. Disc levels:  No impingement identified. Upper chest: Negative. Other: None. IMPRESSION: 1. No acute intracranial process. 2. 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 3. No acute fracture or subluxation of the cervical spine. Electronically Signed   By: Audie Pinto M.D.   On:  11/29/2020 14:34   CT Cervical Spine Wo Contrast  Result Date: 11/29/2020 CLINICAL DATA:  Pt reports she fell in her parking lot week or so ago and was seen here. Reports left arm numbness since fall. EXAM: CT HEAD WITHOUT CONTRAST CT CERVICAL SPINE WITHOUT CONTRAST TECHNIQUE: Multidetector CT imaging of the head and cervical spine was performed following the standard protocol without intravenous contrast. Multiplanar CT image reconstructions of the cervical spine were also generated. COMPARISON:  CT head and neck 06/25/2020 FINDINGS: CT HEAD FINDINGS Brain: No evidence of acute infarction, hemorrhage, hydrocephalus, extra-axial collection or mass lesion/mass effect. 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 The Vascular: No hyperdense vessel or unexpected calcification. Skull: Normal. Negative for fracture or focal lesion. Sinuses/Orbits: No acute finding. Other: None. CT CERVICAL SPINE FINDINGS Alignment: Normal. Skull base and vertebrae: No acute fracture. No primary bone lesion or focal pathologic process. Soft tissues and spinal canal: No prevertebral fluid or swelling. No visible canal hematoma. Disc levels:  No impingement identified. Upper chest: Negative. Other: None. IMPRESSION: 1. No acute intracranial process. 2. 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 3. No acute fracture or subluxation of the cervical spine. Electronically Signed   By: Audie Pinto M.D.   On: 11/29/2020 14:34   US Abdomen Complete  Result Date: 12/01/2020 CLINICAL DATA:  Cirrhosis EXAM: ABDOMEN ULTRASOUND COMPLETE COMPARISON:  06/25/2020 and prior FINDINGS: Gallbladder: No gallstones or wall thickening visualized. No sonographic Murphy sign noted by sonographer. Common bile duct: Diameter: 5.5 mm Liver: No focal lesion identified. Increased parenchymal echogenicity. Portal vein is patent on  color Doppler imaging with normal direction of blood flow towards the liver. IVC: No abnormality visualized. Pancreas: Visualized portion unremarkable. Spleen: Size and appearance within normal limits. Right Kidney: Length: 10.8 cm. Echogenicity within normal limits. No mass or hydronephrosis visualized. Left Kidney: Length: 11.2 cm. Echogenicity within normal limits. No mass or hydronephrosis visualized. Abdominal aorta: No aneurysm visualized. Other findings: None. IMPRESSION: Hepatic steatosis.  No focal hepatic lesion. Electronically Signed   By: Primitivo Gauze M.D.   On: 12/01/2020 16:49   CT BIOPSY  Result Date: 12/01/2020 INDICATION: 54 year old female with a history pancytopenia referred for biopsy EXAM: CT BIOPSY; CT BONE MARROW BIOPSY AND ASPIRATION MEDICATIONS: None. ANESTHESIA/SEDATION: Moderate (conscious) sedation was employed during this procedure. A total of Versed 0 mg and Fentanyl 50 mcg was administered intravenously. Moderate Sedation Time: 0 minutes. The patient's level of consciousness and vital signs were monitored continuously by radiology nursing throughout the procedure under my direct supervision. FLUOROSCOPY TIME:  CT COMPLICATIONS: None PROCEDURE: The procedure risks, benefits, and alternatives were explained to the patient. Questions regarding the procedure were encouraged and answered. The patient understands and consents to the procedure. Scout CT of the pelvis was performed for surgical planning purposes. The posterior pelvis was prepped with Chlorhexidine in a sterile fashion, and a sterile drape was applied covering the operative field. A sterile  gown and sterile gloves were used for the procedure. Local anesthesia was provided with 1% Lidocaine. Right posterior iliac bone was targeted for biopsy. The skin and subcutaneous tissues were infiltrated with 1% lidocaine without epinephrine. A small stab incision was made with an 11 blade scalpel, and an 11 gauge Murphy needle  was advanced with CT guidance to the posterior cortex. Manual forced was used to advance the needle through the posterior cortex and the stylet was removed. A bone marrow aspirate was retrieved and passed to a cytotechnologist in the room. The Murphy needle was then advanced without the stylet for a core biopsy. The core biopsy was retrieved and also passed to a cytotechnologist. Manual pressure was used for hemostasis and a sterile dressing was placed. No complications were encountered no significant blood loss was encountered. Patient tolerated the procedure well and remained hemodynamically stable throughout. IMPRESSION: Status post CT-guided bone marrow biopsy, with tissue specimen sent to pathology for complete histopathologic analysis Signed, Dulcy Fanny. Earleen Newport, DO [facility] Radiology Electronically Signed   By: Corrie Mckusick D.O.   On: 12/01/2020 15:47   DG Chest Port 1 View  Result Date: 11/29/2020 CLINICAL DATA:  Chest pain, fall EXAM: PORTABLE CHEST 1 VIEW COMPARISON:  08/02/2019 FINDINGS: The heart size and mediastinal contours are stable. Atherosclerotic calcification of the aortic knob. Both lungs are clear. The visualized skeletal structures are unremarkable. IMPRESSION: No active disease. Electronically Signed   By: Davina Poke D.O.   On: 11/29/2020 14:39   DG Hand Complete Left  Result Date: 11/30/2020 CLINICAL DATA:  Left hand pain following fall, initial encounter EXAM: LEFT HAND - COMPLETE 3+ VIEW COMPARISON:  None. FINDINGS: No acute fracture or dislocation is noted. Mild osteopenia is seen. No soft tissue abnormality is noted. IMPRESSION: No acute abnormality seen. Electronically Signed   By: Inez Catalina M.D.   On: 11/30/2020 14:33   CT BONE MARROW BIOPSY & ASPIRATION  Result Date: 12/01/2020 INDICATION: 54 year old female with a history pancytopenia referred for biopsy EXAM: CT BIOPSY; CT BONE MARROW BIOPSY AND ASPIRATION  MEDICATIONS: None. ANESTHESIA/SEDATION: Moderate (conscious) sedation was employed during this procedure. A total of Versed 0 mg and Fentanyl 50 mcg was administered intravenously. Moderate Sedation Time: 0 minutes. The patient's level of consciousness and vital signs were monitored continuously by radiology nursing throughout the procedure under my direct supervision. FLUOROSCOPY TIME:  CT COMPLICATIONS: None PROCEDURE: The procedure risks, benefits, and alternatives were explained to the patient. Questions regarding the procedure were encouraged and answered. The patient understands and consents to the procedure. Scout CT of the pelvis was performed for surgical planning purposes. The posterior pelvis was prepped with Chlorhexidine in a sterile fashion, and a sterile drape was applied covering the operative field. A sterile gown and sterile gloves were used for the procedure. Local anesthesia was provided with 1% Lidocaine. Right posterior iliac bone was targeted for biopsy. The skin and subcutaneous tissues were infiltrated with 1% lidocaine without epinephrine. A small stab incision was made with an 11 blade scalpel, and an 11 gauge Murphy needle was advanced with CT guidance to the posterior cortex. Manual forced was used to advance the needle through the posterior cortex and the stylet was removed. A bone marrow aspirate was retrieved and passed to a cytotechnologist in the room. The Murphy needle was then advanced without the stylet for a core biopsy. The core biopsy was retrieved and also passed to a cytotechnologist. Manual pressure was used for  hemostasis and a sterile dressing was placed. No complications were encountered no significant blood loss was encountered. Patient tolerated the procedure well and remained hemodynamically stable throughout. IMPRESSION: Status post CT-guided bone marrow biopsy, with tissue specimen sent to pathology for complete histopathologic analysis Signed, Dulcy Fanny. Earleen Newport, DO  [facility] Radiology Electronically Signed   By: Corrie Mckusick D.O.   On: 12/01/2020 15:47       Subjective: Patient seen and examined at bedside. She feels better and wants to go home today. Denies overnight fever, vomiting, worsening shortness of breath.  Discharge Exam: Vitals:   12/01/20 2119 12/02/20 0530  BP: 131/68 123/67  Pulse: 88 83  Resp: 20 18  Temp: 98.3 F (36.8 C) 98.2 F (36.8 C)  SpO2: 100% 99%    General: Pt is alert, awake, not in acute distress. Looks chronically ill and deconditioned. Poor historian. Flat affect. Cardiovascular: rate controlled, S1/S2 + Respiratory: bilateral decreased breath sounds at bases Abdominal: Soft, NT, ND, bowel sounds + Extremities: Trace lower extremity edema; no cyanosis    The results of significant diagnostics from this hospitalization (including imaging, microbiology, ancillary and laboratory) are listed below for reference.     Microbiology: Recent Results (from the past 240 hour(s))  Resp Panel by RT-PCR (Flu A&B, Covid) Nasopharyngeal Swab     Status: None   Collection Time: 11/29/20  1:33 PM   Specimen: Nasopharyngeal Swab; Nasopharyngeal(NP) swabs in vial transport medium  Result Value Ref Range Status   SARS Coronavirus 2 by RT PCR NEGATIVE NEGATIVE Final    Comment: (NOTE) SARS-CoV-2 target nucleic acids are NOT DETECTED.  The SARS-CoV-2 RNA is generally detectable in upper respiratory specimens during the acute phase of infection. The lowest concentration of SARS-CoV-2 viral copies this assay can detect is 138 copies/mL. A negative result does not preclude SARS-Cov-2 infection and should not be used as the sole basis for treatment or other patient management decisions. A negative result may occur with  improper specimen collection/handling, submission of specimen other than nasopharyngeal swab, presence of viral mutation(s) within the areas targeted by this  assay, and inadequate number of viral copies(<138 copies/mL). A negative result must be combined with clinical observations, patient history, and epidemiological information. The expected result is Negative.  Fact Sheet for Patients:  EntrepreneurPulse.com.au  Fact Sheet for Healthcare Providers:  IncredibleEmployment.be  This test is no t yet approved or cleared by the [place] FDA and  has been authorized for detection and/or diagnosis of SARS-CoV-2 by FDA under an Emergency Use Authorization (EUA). This EUA will remain  in effect (meaning this test can be used) for the duration of the COVID-19 declaration under Section 564(b)(1) of the Act, 21 U.S.C.section 360bbb-3(b)(1), unless the authorization is terminated  or revoked sooner.       Influenza A by PCR NEGATIVE NEGATIVE Final   Influenza B by PCR NEGATIVE NEGATIVE Final    Comment: (NOTE) The Xpert Xpress SARS-CoV-2/FLU/RSV plus assay is intended as an aid in the diagnosis of influenza from Nasopharyngeal swab specimens and should not be used as a sole basis for treatment. Nasal washings and aspirates are unacceptable for Xpert Xpress SARS-CoV-2/FLU/RSV testing.  Fact Sheet for Patients: EntrepreneurPulse.com.au  Fact Sheet for Healthcare Providers: IncredibleEmployment.be  This test is not yet approved or cleared by the [place] FDA and has been authorized for detection and/or diagnosis of SARS-CoV-2 by FDA under an Emergency Use Authorization (EUA). This EUA will remain in effect (meaning  this test can be used) for the duration of the COVID-19 declaration under Section 564(b)(1) of the Act, 21 U.S.C. section 360bbb-3(b)(1), unless the authorization is terminated or revoked.  Performed at [facility], [place] [address] [place], [place] [address]      Labs: BNP (last 3 results) No results for input(s): BNP in the  last 8760 hours. Basic Metabolic Panel: Recent Labs  Lab 11/29/20 1315 11/30/20 0612 12/01/20 0525 12/02/20 0614  NA 138 136 140 141  K 3.9 3.0* 4.1 3.6  CL 104 108 112* 108  CO2 21* 18* 20* 22  GLUCOSE 109* 74 88 97  BUN 7 6 5* 7  CREATININE 0.44 0.38* 0.42* 0.45  CALCIUM 8.3* 6.9* 8.2* 8.5*  MG  --  1.7 2.0 1.7  PHOS  --  2.8 3.0 3.8   Liver Function Tests: Recent Labs  Lab 11/29/20 1623 11/30/20 0612 12/01/20 0525 12/02/20 0614  AST _0 ALT _1 ALKPHOS 64 60 58 62  BILITOT 0.5 0.9 0.3 0.4  PROT 6.1* 5.7* 5.7* 5.8*  ALBUMIN 3.4* 2.9* 3.0* 3.2*   No results for input(s): LIPASE, AMYLASE in the last 168 hours. No results for input(s): AMMONIA in the last 168 hours. CBC: Recent Labs  Lab 11/29/20 1315 11/30/20 0135 11/30/20 0612 12/01/20 0525 12/02/20 0614  WBC 3.4*  --  2.9* 3.2* 4.0  NEUTROABS  --   --  0.6* 0.4* 0.6*  HGB 5.0* 8.4* 8.0* 7.6* 7.5*  HCT 15.6* 25.4* 24.0* 23.4* 23.6*  MCV 120.0*  --  98.8 103.1* 104.0*  PLT 13*  --  8* 7* 129*   Cardiac Enzymes: No results for input(s): CKTOTAL, CKMB, CKMBINDEX, TROPONINI in the last 168 hours. BNP: Invalid input(s): POCBNP CBG: No results for input(s): GLUCAP in the last 168 hours. D-Dimer No results for input(s): DDIMER in the last 72 hours. Hgb A1c No results for input(s): HGBA1C in the last 72 hours. Lipid Profile No results for input(s): CHOL, HDL, LDLCALC, TRIG, CHOLHDL, LDLDIRECT in the last 72 hours. Thyroid function studies Recent Labs    11/30/20 0612  TSH 1.332   Anemia work up Recent Labs    11/29/20 1600  VITAMINB12 176*  FOLATE 9.2  FERRITIN 103  TIBC 384  IRON 275*  RETICCTPCT 2.5   Urinalysis    Component Value Date/Time   COLORURINE YELLOW 01/13/2019 1822   APPEARANCEUR CLEAR 01/13/2019 1822   LABSPEC 1.020 01/13/2019 1822   PHURINE 6.0 01/13/2019 1822   GLUCOSEU NEGATIVE 01/13/2019 1822   HGBUR NEGATIVE 01/13/2019 1822   BILIRUBINUR NEGATIVE  01/13/2019 1822   KETONESUR 80 (A) 01/13/2019 1822   PROTEINUR 30 (A) 01/13/2019 1822   UROBILINOGEN 0.2 11/11/2010 0327   NITRITE POSITIVE (A) 01/13/2019 1822   LEUKOCYTESUR NEGATIVE 01/13/2019 1822   Sepsis Labs Invalid input(s): PROCALCITONIN,  WBC,  LACTICIDVEN Microbiology Recent Results (from the past 240 hour(s))  Resp Panel by RT-PCR (Flu A&B, Covid) Nasopharyngeal Swab     Status: None   Collection Time: 11/29/20  1:33 PM   Specimen: Nasopharyngeal Swab; Nasopharyngeal(NP) swabs in vial transport medium  Result Value Ref Range Status   SARS Coronavirus 2 by RT PCR NEGATIVE NEGATIVE Final    Comment: (NOTE) SARS-CoV-2 target nucleic acids are NOT DETECTED.  The SARS-CoV-2 RNA is generally detectable in upper respiratory specimens during the acute phase of infection. The lowest concentration of SARS-CoV-2 viral copies this assay can detect is 138 copies/mL.  A negative result does not preclude SARS-Cov-2 infection and should not be used as the sole basis for treatment or other patient management decisions. A negative result may occur with  improper specimen collection/handling, submission of specimen other than nasopharyngeal swab, presence of viral mutation(s) within the areas targeted by this assay, and inadequate number of viral copies(<138 copies/mL). A negative result must be combined with clinical observations, patient history, and epidemiological information. The expected result is Negative.  Fact Sheet for Patients:  EntrepreneurPulse.com.au  Fact Sheet for Healthcare Providers:  IncredibleEmployment.be  This test is no t yet approved or cleared by the [place] FDA and  has been authorized for detection and/or diagnosis of SARS-CoV-2 by FDA under an Emergency Use Authorization (EUA). This EUA will remain  in effect (meaning this test can be used) for the duration of the COVID-19 declaration under Section 564(b)(1) of the  Act, 21 U.S.C.section 360bbb-3(b)(1), unless the authorization is terminated  or revoked sooner.       Influenza A by PCR NEGATIVE NEGATIVE Final   Influenza B by PCR NEGATIVE NEGATIVE Final    Comment: (NOTE) The Xpert Xpress SARS-CoV-2/FLU/RSV plus assay is intended as an aid in the diagnosis of influenza from Nasopharyngeal swab specimens and should not be used as a sole basis for treatment. Nasal washings and aspirates are unacceptable for Xpert Xpress SARS-CoV-2/FLU/RSV testing.  Fact Sheet for Patients: EntrepreneurPulse.com.au  Fact Sheet for Healthcare Providers: IncredibleEmployment.be  This test is not yet approved or cleared by the [place] FDA and has been authorized for detection and/or diagnosis of SARS-CoV-2 by FDA under an Emergency Use Authorization (EUA). This EUA will remain in effect (meaning this test can be used) for the duration of the COVID-19 declaration under Section 564(b)(1) of the Act, 21 U.S.C. section 360bbb-3(b)(1), unless the authorization is terminated or revoked.  Performed at [facility], [place] [address] [address] [place], [place] [address]      Time coordinating discharge: 35 minutes  SIGNED:   Aline August, MD  [facility] 12/02/2020, 11:22 AM

## 2020-12-02 NOTE — Plan of Care (Signed)
  Problem: Education: Goal: Knowledge of General Education information will improve Description: Including pain rating scale, medication(s)/side effects and non-pharmacologic comfort measures Outcome: Progressing   Problem: Clinical Measurements: Goal: Respiratory complications will improve Outcome: Progressing   Problem: Nutrition: Goal: Adequate nutrition will be maintained Outcome: Progressing   Problem: Coping: Goal: Level of anxiety will decrease Outcome: Progressing   Problem: Elimination: Goal: Will not experience complications related to bowel motility Outcome: Progressing Goal: Will not experience complications related to urinary retention Outcome: Progressing   Problem: Pain Managment: Goal: General experience of comfort will improve Outcome: Progressing   Problem: Safety: Goal: Ability to remain free from injury will improve Outcome: Progressing   Problem: Skin Integrity: Goal: Risk for impaired skin integrity will decrease Outcome: Progressing   

## 2020-12-02 NOTE — Progress Notes (Signed)
Ms. Doig actually had a bone marrow biopsy yesterday.  I am very impressed with radiology being able to do this so quickly.  I have a hard time believing her platelet count is now 129,000.  She was 7000 yesterday.  I think she is got 1 unit of platelets.  Her white cell count is trending upward.  Is 4000 today.  Her hemoglobin is 7.5.  She had an ultrasound of her abdomen.  This showed some hepatic steatosis.  There is no splenomegaly.  Everything else looked okay.  I told her again, that she must stop drinking beer.  I know she does not drink every day but I suspect that she probably does drink quite a bit when she does have a beer.  She is eating well.  She is getting her vitamin B12.  I told her that she MUST come to the office for her B12 shot.  She has had no bleeding.  She is up and about a little bit.  She has had no fever.  There is been no cough or shortness of breath.  Her vital signs all look stable.  Blood pressure is 123/67.  Pulse is 83.  Temperature 98.2.  There really is no changes on her physical exam.  I would like to think that she would be able to go home maybe even today or tomorrow at the latest.  Again, she needs her B12 injections.  I am still not convinced that she will come to the office for her B12 injections.  I am also positive that she will continue to drink beer at home.  It will be interesting to see what the bone marrow biopsy shows.  I do not think we have any results back for at least a day or 2.  I appreciate the great care that she is gone from all the staff on 4 E.  Lattie Haw, MD  Lurena Joiner 1:32

## 2020-12-02 NOTE — Consult Note (Addendum)
   [facility] Inpatient Consult   12/02/2020  FARIA CASELLA [birth year] [id number]   [facility] Follow up:  Referral received from inpatient RNCM as home health PT recommended but no home health agencies approved.   Spoke with patient by telephone. HIPAA verified. Explained [facility] CM services as community care coordination services and that we work with patient and primary provider assisting with disease management and care coordination. Patient states that she does not have a primary provider at this time. States that a list of providers has been given to her so that she may make a choice of new provider. Explained to patient that she will receive a general EMMI call when discharged home, and if assistance is needed to follow up with primary provider, she may indicate that during [place] call and a follow up call will be made by [place] RN. Patient verbalizes understanding.  [facility] CM will not follow at this time as care coordination services are a collaboration between care manager and patient primary provider.   Netta Cedars, MSN, [facility] Liaison Nurse Mobile Phone [phone]  Toll free office [phone]

## 2020-12-02 NOTE — Plan of Care (Signed)
Pt medically stable for discharge & goals met.

## 2020-12-04 LAB — SURGICAL PATHOLOGY

## 2020-12-05 LAB — VITAMIN B1: Vitamin B1 (Thiamine): 41.5 nmol/L — ABNORMAL LOW (ref 66.5–200.0)

## 2020-12-05 LAB — METHYLMALONIC ACID(MMA), RND URINE
Creatinine(Crt), U: 0.26 g/L — ABNORMAL LOW (ref 0.30–3.00)
MMA - Normalized: 1.7 umol/mmol cr (ref 0.5–3.4)
Methylmalonic Acid, Ur: 3.9 umol/L (ref 1.6–29.7)

## 2020-12-09 ENCOUNTER — Ambulatory Visit: Payer: Medicare Other

## 2020-12-10 ENCOUNTER — Encounter (HOSPITAL_COMMUNITY): Payer: Self-pay | Admitting: Hematology & Oncology

## 2020-12-10 ENCOUNTER — Telehealth: Payer: Self-pay | Admitting: Hematology & Oncology

## 2020-12-10 NOTE — Telephone Encounter (Signed)
Called and LMVM for patient that 1/5 appt has been cancelled per 12/23 sch msg

## 2020-12-16 ENCOUNTER — Inpatient Hospital Stay: Payer: Medicare Other

## 2020-12-23 ENCOUNTER — Other Ambulatory Visit: Payer: Self-pay | Admitting: *Deleted

## 2020-12-23 ENCOUNTER — Encounter (HOSPITAL_COMMUNITY): Payer: Self-pay | Admitting: Hematology & Oncology

## 2020-12-23 ENCOUNTER — Inpatient Hospital Stay: Payer: Medicare Other

## 2020-12-23 DIAGNOSIS — Z862 Personal history of diseases of the blood and blood-forming organs and certain disorders involving the immune mechanism: Secondary | ICD-10-CM

## 2020-12-23 LAB — SURGICAL PATHOLOGY

## 2020-12-24 ENCOUNTER — Telehealth: Payer: Self-pay | Admitting: *Deleted

## 2020-12-24 ENCOUNTER — Inpatient Hospital Stay: Payer: Medicare Other

## 2020-12-24 ENCOUNTER — Inpatient Hospital Stay: Payer: Medicare Other | Admitting: Hematology & Oncology

## 2020-12-24 NOTE — Telephone Encounter (Signed)
Call placed to Pt pharmacy to verify pt phone #. [facility] gave pt # of [id number]. Called this phone #, unable to leave message- " mailbox is full"

## 2020-12-24 NOTE — Telephone Encounter (Signed)
Call from Officer [place] who arrived at pt's address advised pt did not answer the door. The neighbors report they have not seen her nor does she drive.  He will proceed with a report on pt and call office with any additional information.   Updated MD with above information

## 2020-12-24 NOTE — Telephone Encounter (Signed)
After multiple attempts to reach pt and unsucessful. Call placed to [place] [employer] Non Emergent line for a welfare check on pt. Dispatcher took down pt address/name/DOB and advised she will send out an Technical sales engineer. Requested office to call office with an update on pt as MD is concerned about her health.

## 2020-12-24 NOTE — Telephone Encounter (Signed)
Pt missed appt with MD today, called pt home/mobile and all other Emergency contact #'s listed for pt. Unable to reach pt

## 2020-12-25 ENCOUNTER — Telehealth: Payer: Self-pay

## 2020-12-25 NOTE — Telephone Encounter (Signed)
Misty from Dr. Ulice Bold office called to see if patient came in for her appt with Dr.Ennever yesterday, they have been unable to reach her and have multiple missed appointments.   Informed Misty we have been unable to reach patient or patients family members and yesterday [place] police were notified and are going to do a well check. Informed her if we heard anything new we could call and let her know. Mistys direct number is [phone] and office number if unable to get her is [phone], opt 2 and opt 2.  She will also notify our office if they get in contact with her.

## 2020-12-28 ENCOUNTER — Telehealth: Payer: Self-pay | Admitting: *Deleted

## 2020-12-28 NOTE — Telephone Encounter (Signed)
Attempted to contact pt to come in for MD appt. No answer at home phone.  Message left on patient's mobile number for pt to return call to this office ASAP.

## 2021-04-29 DIAGNOSIS — I499 Cardiac arrhythmia, unspecified: Secondary | ICD-10-CM | POA: Diagnosis not present

## 2021-04-29 DIAGNOSIS — Z743 Need for continuous supervision: Secondary | ICD-10-CM | POA: Diagnosis not present

## 2021-04-29 DIAGNOSIS — R6889 Other general symptoms and signs: Secondary | ICD-10-CM | POA: Diagnosis not present

## 2021-04-29 DIAGNOSIS — R079 Chest pain, unspecified: Secondary | ICD-10-CM | POA: Diagnosis not present

## 2021-04-29 DIAGNOSIS — R0789 Other chest pain: Secondary | ICD-10-CM | POA: Diagnosis not present

## 2021-04-30 ENCOUNTER — Encounter (HOSPITAL_COMMUNITY): Payer: Self-pay | Admitting: Internal Medicine

## 2021-04-30 ENCOUNTER — Inpatient Hospital Stay (HOSPITAL_COMMUNITY)
Admission: EM | Admit: 2021-04-30 | Discharge: 2021-05-04 | DRG: 835 | Disposition: A | Discharge status: Hospice | Payer: Medicare Other | Attending: Family Medicine | Admitting: Family Medicine

## 2021-04-30 ENCOUNTER — Emergency Department (HOSPITAL_COMMUNITY): Payer: Medicare Other

## 2021-04-30 ENCOUNTER — Other Ambulatory Visit: Payer: Self-pay

## 2021-04-30 DIAGNOSIS — R079 Chest pain, unspecified: Secondary | ICD-10-CM

## 2021-04-30 DIAGNOSIS — D696 Thrombocytopenia, unspecified: Secondary | ICD-10-CM | POA: Diagnosis not present

## 2021-04-30 DIAGNOSIS — Z789 Other specified health status: Secondary | ICD-10-CM

## 2021-04-30 DIAGNOSIS — I4510 Unspecified right bundle-branch block: Secondary | ICD-10-CM | POA: Diagnosis present

## 2021-04-30 DIAGNOSIS — J449 Chronic obstructive pulmonary disease, unspecified: Secondary | ICD-10-CM | POA: Diagnosis not present

## 2021-04-30 DIAGNOSIS — Z86718 Personal history of other venous thrombosis and embolism: Secondary | ICD-10-CM

## 2021-04-30 DIAGNOSIS — I248 Other forms of acute ischemic heart disease: Secondary | ICD-10-CM | POA: Diagnosis present

## 2021-04-30 DIAGNOSIS — D630 Anemia in neoplastic disease: Secondary | ICD-10-CM | POA: Diagnosis not present

## 2021-04-30 DIAGNOSIS — R21 Rash and other nonspecific skin eruption: Secondary | ICD-10-CM | POA: Diagnosis not present

## 2021-04-30 DIAGNOSIS — I110 Hypertensive heart disease with heart failure: Secondary | ICD-10-CM | POA: Diagnosis present

## 2021-04-30 DIAGNOSIS — Z87442 Personal history of urinary calculi: Secondary | ICD-10-CM | POA: Diagnosis not present

## 2021-04-30 DIAGNOSIS — D649 Anemia, unspecified: Secondary | ICD-10-CM

## 2021-04-30 DIAGNOSIS — Z8249 Family history of ischemic heart disease and other diseases of the circulatory system: Secondary | ICD-10-CM

## 2021-04-30 DIAGNOSIS — Z96651 Presence of right artificial knee joint: Secondary | ICD-10-CM | POA: Diagnosis not present

## 2021-04-30 DIAGNOSIS — I517 Cardiomegaly: Secondary | ICD-10-CM | POA: Diagnosis not present

## 2021-04-30 DIAGNOSIS — Z7401 Bed confinement status: Secondary | ICD-10-CM

## 2021-04-30 DIAGNOSIS — R7401 Elevation of levels of liver transaminase levels: Secondary | ICD-10-CM | POA: Diagnosis not present

## 2021-04-30 DIAGNOSIS — L8990 Pressure ulcer of unspecified site, unspecified stage: Secondary | ICD-10-CM | POA: Insufficient documentation

## 2021-04-30 DIAGNOSIS — Z720 Tobacco use: Secondary | ICD-10-CM

## 2021-04-30 DIAGNOSIS — L89891 Pressure ulcer of other site, stage 1: Secondary | ICD-10-CM | POA: Clinically undetermined

## 2021-04-30 DIAGNOSIS — R5381 Other malaise: Secondary | ICD-10-CM | POA: Diagnosis not present

## 2021-04-30 DIAGNOSIS — E876 Hypokalemia: Secondary | ICD-10-CM | POA: Diagnosis not present

## 2021-04-30 DIAGNOSIS — D539 Nutritional anemia, unspecified: Secondary | ICD-10-CM | POA: Diagnosis not present

## 2021-04-30 DIAGNOSIS — K7460 Unspecified cirrhosis of liver: Secondary | ICD-10-CM | POA: Diagnosis present

## 2021-04-30 DIAGNOSIS — Z66 Do not resuscitate: Secondary | ICD-10-CM | POA: Diagnosis not present

## 2021-04-30 DIAGNOSIS — C9200 Acute myeloblastic leukemia, not having achieved remission: Secondary | ICD-10-CM | POA: Diagnosis not present

## 2021-04-30 DIAGNOSIS — Z91030 Bee allergy status: Secondary | ICD-10-CM | POA: Diagnosis not present

## 2021-04-30 DIAGNOSIS — Z515 Encounter for palliative care: Secondary | ICD-10-CM

## 2021-04-30 DIAGNOSIS — Z809 Family history of malignant neoplasm, unspecified: Secondary | ICD-10-CM | POA: Diagnosis not present

## 2021-04-30 DIAGNOSIS — E538 Deficiency of other specified B group vitamins: Secondary | ICD-10-CM | POA: Diagnosis present

## 2021-04-30 DIAGNOSIS — Z751 Person awaiting admission to adequate facility elsewhere: Secondary | ICD-10-CM | POA: Diagnosis not present

## 2021-04-30 DIAGNOSIS — Z888 Allergy status to other drugs, medicaments and biological substances status: Secondary | ICD-10-CM

## 2021-04-30 DIAGNOSIS — K219 Gastro-esophageal reflux disease without esophagitis: Secondary | ICD-10-CM | POA: Diagnosis not present

## 2021-04-30 DIAGNOSIS — R112 Nausea with vomiting, unspecified: Secondary | ICD-10-CM | POA: Diagnosis not present

## 2021-04-30 DIAGNOSIS — R636 Underweight: Secondary | ICD-10-CM | POA: Diagnosis present

## 2021-04-30 DIAGNOSIS — R778 Other specified abnormalities of plasma proteins: Secondary | ICD-10-CM

## 2021-04-30 DIAGNOSIS — Z79899 Other long term (current) drug therapy: Secondary | ICD-10-CM

## 2021-04-30 DIAGNOSIS — Z7189 Other specified counseling: Secondary | ICD-10-CM

## 2021-04-30 DIAGNOSIS — C9500 Acute leukemia of unspecified cell type not having achieved remission: Secondary | ICD-10-CM | POA: Diagnosis not present

## 2021-04-30 DIAGNOSIS — R638 Other symptoms and signs concerning food and fluid intake: Secondary | ICD-10-CM | POA: Diagnosis not present

## 2021-04-30 DIAGNOSIS — F17210 Nicotine dependence, cigarettes, uncomplicated: Secondary | ICD-10-CM | POA: Diagnosis present

## 2021-04-30 DIAGNOSIS — D6959 Other secondary thrombocytopenia: Secondary | ICD-10-CM | POA: Diagnosis present

## 2021-04-30 DIAGNOSIS — F1010 Alcohol abuse, uncomplicated: Secondary | ICD-10-CM | POA: Diagnosis present

## 2021-04-30 DIAGNOSIS — F419 Anxiety disorder, unspecified: Secondary | ICD-10-CM | POA: Diagnosis present

## 2021-04-30 DIAGNOSIS — Z90710 Acquired absence of both cervix and uterus: Secondary | ICD-10-CM

## 2021-04-30 DIAGNOSIS — Z20822 Contact with and (suspected) exposure to covid-19: Secondary | ICD-10-CM | POA: Diagnosis present

## 2021-04-30 DIAGNOSIS — D72829 Elevated white blood cell count, unspecified: Secondary | ICD-10-CM | POA: Diagnosis not present

## 2021-04-30 DIAGNOSIS — I509 Heart failure, unspecified: Secondary | ICD-10-CM | POA: Diagnosis present

## 2021-04-30 LAB — PREPARE RBC (CROSSMATCH)

## 2021-04-30 LAB — COMPREHENSIVE METABOLIC PANEL
ALT: 14 U/L (ref 0–44)
ALT: 16 U/L (ref 0–44)
AST: 37 U/L (ref 15–41)
AST: 48 U/L — ABNORMAL HIGH (ref 15–41)
Albumin: 2.4 g/dL — ABNORMAL LOW (ref 3.5–5.0)
Albumin: 2.3 g/dL — ABNORMAL LOW (ref 3.5–5.0)
Alkaline Phosphatase: 50 U/L (ref 38–126)
Alkaline Phosphatase: 47 U/L (ref 38–126)
Anion gap: 13 (ref 5–15)
Anion gap: 13 (ref 5–15)
BUN: 8 mg/dL (ref 6–20)
BUN: 8 mg/dL (ref 6–20)
CO2: 24 mmol/L (ref 22–32)
CO2: 23 mmol/L (ref 22–32)
Calcium: 5.6 mg/dL — CL (ref 8.9–10.3)
Calcium: 5.7 mg/dL — CL (ref 8.9–10.3)
Chloride: 95 mmol/L — ABNORMAL LOW (ref 98–111)
Chloride: 96 mmol/L — ABNORMAL LOW (ref 98–111)
Creatinine, Ser: 0.97 mg/dL (ref 0.44–1.00)
Creatinine, Ser: 1.01 mg/dL — ABNORMAL HIGH (ref 0.44–1.00)
GFR, Estimated: >60 mL/min (ref >60)
GFR, Estimated: >60 mL/min (ref >60)
Glucose, Bld: 96 mg/dL (ref 70–99)
Glucose, Bld: 93 mg/dL (ref 70–99)
Potassium: 2.4 mmol/L — CL (ref 3.5–5.1)
Potassium: 2.4 mmol/L — CL (ref 3.5–5.1)
Sodium: 132 mmol/L — ABNORMAL LOW (ref 135–145)
Sodium: 132 mmol/L — ABNORMAL LOW (ref 135–145)
Total Bilirubin: 1.3 mg/dL — ABNORMAL HIGH (ref 0.3–1.2)
Total Bilirubin: 0.8 mg/dL (ref 0.3–1.2)
Total Protein: 5.1 g/dL — ABNORMAL LOW (ref 6.5–8.1)
Total Protein: 4.9 g/dL — ABNORMAL LOW (ref 6.5–8.1)

## 2021-04-30 LAB — CBC
HCT: 14.3 % — ABNORMAL LOW (ref 36.0–46.0)
Hemoglobin: 4.7 g/dL — CL (ref 12.0–15.0)
MCH: 33.8 pg (ref 26.0–34.0)
MCHC: 32.9 g/dL (ref 30.0–36.0)
MCV: 102.9 fL — ABNORMAL HIGH (ref 80.0–100.0)
Platelets: 20 10*3/uL — CL (ref 150–400)
RBC: 1.39 MIL/uL — ABNORMAL LOW (ref 3.87–5.11)
RDW: 23.9 % — ABNORMAL HIGH (ref 11.5–15.5)
WBC: 21.5 10*3/uL — ABNORMAL HIGH (ref 4.0–10.5)
nRBC: 1.4 % — ABNORMAL HIGH (ref 0.0–0.2)

## 2021-04-30 LAB — HEMOGLOBIN AND HEMATOCRIT, BLOOD
HCT: 27 % — ABNORMAL LOW (ref 36.0–46.0)
Hemoglobin: 9.2 g/dL — ABNORMAL LOW (ref 12.0–15.0)

## 2021-04-30 LAB — PHOSPHORUS: Phosphorus: 3.7 mg/dL (ref 2.5–4.6)

## 2021-04-30 LAB — PATHOLOGIST SMEAR REVIEW

## 2021-04-30 LAB — POC OCCULT BLOOD, ED: Fecal Occult Bld: NEGATIVE

## 2021-04-30 LAB — SARS CORONAVIRUS 2 (TAT 6-24 HRS): SARS Coronavirus 2: NEGATIVE

## 2021-04-30 LAB — MAGNESIUM: Magnesium: 0.8 mg/dL — CL (ref 1.7–2.4)

## 2021-04-30 LAB — TROPONIN I (HIGH SENSITIVITY): Troponin I (High Sensitivity): 114 ng/L (ref <18)

## 2021-04-30 MED ORDER — POTASSIUM CHLORIDE CRYS ER 20 MEQ PO TBCR
40.0000 meq | EXTENDED_RELEASE_TABLET | Freq: Two times a day (BID) | ORAL | Status: DC
  Administered 2021-04-30: 40 meq via ORAL
  Filled 2021-04-30 (×2): qty 2

## 2021-04-30 MED ORDER — MORPHINE SULFATE (PF) 2 MG/ML IV SOLN
2.0000 mg | INTRAVENOUS | Status: DC | PRN
  Administered 2021-04-30 – 2021-05-02 (×2): 2 mg via INTRAVENOUS
  Filled 2021-04-30 (×2): qty 1

## 2021-04-30 MED ORDER — THIAMINE HCL 100 MG PO TABS
100.0000 mg | ORAL_TABLET | Freq: Every day | ORAL | Status: DC
  Administered 2021-04-30: 100 mg via ORAL
  Filled 2021-04-30: qty 1

## 2021-04-30 MED ORDER — SUCRALFATE 1 G PO TABS
1.0000 g | ORAL_TABLET | Freq: Three times a day (TID) | ORAL | Status: DC
  Administered 2021-04-30 – 2021-05-04 (×8): 1 g via ORAL
  Filled 2021-04-30 (×11): qty 1

## 2021-04-30 MED ORDER — LORAZEPAM 2 MG/ML IJ SOLN
1.0000 mg | INTRAMUSCULAR | Status: AC | PRN
  Administered 2021-05-01: 2 mg via INTRAVENOUS
  Filled 2021-04-30: qty 1

## 2021-04-30 MED ORDER — MORPHINE SULFATE (PF) 2 MG/ML IV SOLN
2.0000 mg | INTRAVENOUS | Status: DC | PRN
  Administered 2021-04-30: 2 mg via INTRAVENOUS
  Filled 2021-04-30: qty 1

## 2021-04-30 MED ORDER — HALOPERIDOL LACTATE 5 MG/ML IJ SOLN: 0.5000 mg | INTRAMUSCULAR | Status: DC | PRN

## 2021-04-30 MED ORDER — FOLIC ACID 1 MG PO TABS
1.0000 mg | ORAL_TABLET | Freq: Every day | ORAL | Status: DC
  Administered 2021-04-30: 1 mg via ORAL
  Filled 2021-04-30: qty 1

## 2021-04-30 MED ORDER — SODIUM CHLORIDE 0.9 % IV SOLN
1.0000 g | Freq: Once | INTRAVENOUS | Status: AC
  Administered 2021-04-30: 1 g via INTRAVENOUS
  Filled 2021-04-30: qty 10

## 2021-04-30 MED ORDER — LORAZEPAM 2 MG/ML IJ SOLN: 1.0000 mg | INTRAMUSCULAR | Status: DC | PRN

## 2021-04-30 MED ORDER — POLYVINYL ALCOHOL 1.4 % OP SOLN
1.0000 [drp] | Freq: Four times a day (QID) | OPHTHALMIC | Status: DC | PRN
  Filled 2021-04-30: qty 15

## 2021-04-30 MED ORDER — GLYCOPYRROLATE 1 MG PO TABS
1.0000 mg | ORAL_TABLET | ORAL | Status: DC | PRN
  Filled 2021-04-30: qty 1

## 2021-04-30 MED ORDER — SODIUM CHLORIDE 0.9% FLUSH
3.0000 mL | Freq: Two times a day (BID) | INTRAVENOUS | Status: DC
  Administered 2021-04-30 – 2021-05-04 (×7): 3 mL via INTRAVENOUS

## 2021-04-30 MED ORDER — THIAMINE HCL 100 MG/ML IJ SOLN: 100.0000 mg | Freq: Every day | INTRAMUSCULAR | Status: DC

## 2021-04-30 MED ORDER — ACETAMINOPHEN 650 MG RE SUPP: 650.0000 mg | Freq: Four times a day (QID) | RECTAL | Status: DC | PRN

## 2021-04-30 MED ORDER — LORAZEPAM 2 MG/ML PO CONC: 1.0000 mg | ORAL | Status: DC | PRN

## 2021-04-30 MED ORDER — NICOTINE 21 MG/24HR TD PT24
21.0000 mg | MEDICATED_PATCH | Freq: Every day | TRANSDERMAL | Status: DC
  Administered 2021-04-30 – 2021-05-04 (×5): 21 mg via TRANSDERMAL
  Filled 2021-04-30 (×5): qty 1

## 2021-04-30 MED ORDER — ALBUTEROL SULFATE (2.5 MG/3ML) 0.083% IN NEBU: 2.5000 mg | INHALATION_SOLUTION | Freq: Four times a day (QID) | RESPIRATORY_TRACT | Status: DC | PRN

## 2021-04-30 MED ORDER — HALOPERIDOL LACTATE 2 MG/ML PO CONC
0.5000 mg | ORAL | Status: DC | PRN
  Filled 2021-04-30: qty 0.3

## 2021-04-30 MED ORDER — SODIUM CHLORIDE 0.9 % IV BOLUS
1000.0000 mL | Freq: Once | INTRAVENOUS | Status: AC
  Administered 2021-04-30: 1000 mL via INTRAVENOUS

## 2021-04-30 MED ORDER — GLYCOPYRROLATE 0.2 MG/ML IJ SOLN
0.2000 mg | INTRAMUSCULAR | Status: DC | PRN
  Administered 2021-05-02: 0.2 mg via INTRAVENOUS
  Filled 2021-04-30: qty 1

## 2021-04-30 MED ORDER — ACETAMINOPHEN 325 MG PO TABS
650.0000 mg | ORAL_TABLET | Freq: Four times a day (QID) | ORAL | Status: DC | PRN
  Administered 2021-05-02: 650 mg via ORAL
  Filled 2021-04-30: qty 2

## 2021-04-30 MED ORDER — ADULT MULTIVITAMIN W/MINERALS CH
1.0000 | ORAL_TABLET | Freq: Every day | ORAL | Status: DC
  Administered 2021-04-30: 1 via ORAL
  Filled 2021-04-30: qty 1

## 2021-04-30 MED ORDER — SODIUM CHLORIDE 0.9% IV SOLUTION: Freq: Once | INTRAVENOUS | Status: AC

## 2021-04-30 MED ORDER — SODIUM CHLORIDE 0.9 % IV SOLN
2.0000 g | Freq: Once | INTRAVENOUS | Status: AC
  Administered 2021-04-30: 2 g via INTRAVENOUS
  Filled 2021-04-30: qty 20

## 2021-04-30 MED ORDER — BIOTENE DRY MOUTH MT LIQD: 15.0000 mL | OROMUCOSAL | Status: DC | PRN

## 2021-04-30 MED ORDER — ONDANSETRON 4 MG PO TBDP: 4.0000 mg | ORAL_TABLET | Freq: Four times a day (QID) | ORAL | Status: DC | PRN

## 2021-04-30 MED ORDER — POTASSIUM CHLORIDE 10 MEQ/100ML IV SOLN
10.0000 meq | INTRAVENOUS | Status: AC
  Administered 2021-04-30 (×3): 10 meq via INTRAVENOUS
  Filled 2021-04-30 (×3): qty 100

## 2021-04-30 MED ORDER — LORAZEPAM 1 MG PO TABS
1.0000 mg | ORAL_TABLET | ORAL | Status: AC | PRN
  Administered 2021-05-01: 2 mg via ORAL
  Filled 2021-04-30: qty 2

## 2021-04-30 MED ORDER — MAGNESIUM SULFATE 2 GM/50ML IV SOLN
2.0000 g | Freq: Once | INTRAVENOUS | Status: AC
  Administered 2021-04-30: 2 g via INTRAVENOUS
  Filled 2021-04-30: qty 50

## 2021-04-30 MED ORDER — HALOPERIDOL 0.5 MG PO TABS
0.5000 mg | ORAL_TABLET | ORAL | Status: DC | PRN
  Filled 2021-04-30: qty 1

## 2021-04-30 MED ORDER — GLYCOPYRROLATE 0.2 MG/ML IJ SOLN: 0.2000 mg | INTRAMUSCULAR | Status: DC | PRN

## 2021-04-30 MED ORDER — LORAZEPAM 1 MG PO TABS: 1.0000 mg | ORAL_TABLET | ORAL | Status: DC | PRN

## 2021-04-30 MED ORDER — ONDANSETRON HCL 4 MG/2ML IJ SOLN
4.0000 mg | Freq: Four times a day (QID) | INTRAMUSCULAR | Status: DC | PRN
  Administered 2021-04-30 – 2021-05-02 (×3): 4 mg via INTRAVENOUS
  Filled 2021-04-30 (×3): qty 2

## 2021-04-30 NOTE — Plan of Care (Signed)
Asked to evaluate Ms. Ferger by [facility]. Her pancytopenia is likely fairly chronic. No report of bleeding. 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 Would consult oncology. If develops worsening hypotension despite transfusion, new bleeding, arrhythmia, or CP refractory to transfusion we are happy to be reinvolved to evaluate for ICU transfer.  Letta Median, Pulmonary/Critical Care

## 2021-04-30 NOTE — Progress Notes (Addendum)
HEMATOLOGY-ONCOLOGY PROGRESS NOTE  SUBJECTIVE: Wendy Edwards is known to our practice from prior hospital admissions.  She was last seen by Dr. Marin Olp on 12/02/2020 during her hospitalization for anemia and thrombocytopenia.  The patient has a history of being treated for aplastic anemia over 20 years ago.  She has had persistent anemia and thrombocytopenia in the setting of alcohol abuse and cirrhosis. 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  Unfortunately, the patient did not keep her follow-up appoint with Dr. Marin Olp and we have not heard from her since her last hospitalization.  Additionally, it appears her case was discussed with Dr. Jerrye Noble at [facility].  According to his note on 12/24/2020, she is not a candidate for usual induction chemotherapy and there was some discussion about considering single agent gilteritinib or sorafenib.  Again, the patient did not keep her follow-up appointment to discuss her treatment options.  She now presents to the hospital with chest pain.  CBC showed a WBC of 26.3, hemoglobin 2.0, platelets 18,000, stool for occult blood negative, potassium 2.4, creatinine 1.01, calcium 5.7, total protein 4.9, albumin 2.3, AST 48.  She has received a unit of FFP and 2 units PRBCs have been ordered.  The patient was seen in the emergency room.  She reports some improvement in her chest pain.  A unit of blood is currently infusing.  She denies recent fevers and chills.  She reports that she has a good appetite.  She has not noticed any bleeding such as epistaxis, hemoptysis, hematemesis, hematuria, melena, hematochezia.  The patient reports that she was unaware of her underlying diagnosis of AML and did not recall missing any appointments.    REVIEW OF SYSTEMS:   Constitutional: Denies fevers, chills  Eyes: Denies blurriness of vision Ears, nose, mouth, throat, and face: Denies mucositis or sore throat Respiratory:  Denies cough, dyspnea or wheezes Cardiovascular: Chest pain improved with blood transfusion. Gastrointestinal:  Denies nausea, heartburn or change in bowel habits Skin: Denies abnormal skin rashes Lymphatics: Bruises easily but denies bleeding Neurological:Denies numbness, tingling or new weaknesses Behavioral/Psych: Mood is stable, no new changes  Extremities: No lower extremity edema All other systems were reviewed with the patient and are negative.  I have reviewed the past medical history, past surgical history, social history and family history with the patient and they are unchanged from previous note.   PHYSICAL EXAMINATION: ECOG PERFORMANCE STATUS: 3 - Symptomatic, >50% confined to bed  Vitals:   04/30/21 0900 04/30/21 0930  BP: 119/64 (!) 145/67  Pulse: (!) 103 (!) 107  Resp: (!) 29 18  Temp:    SpO2: 100% 93%   There were no vitals filed for this visit.  Intake/Output from previous day: 05/12 0701 - 05/13 0700 In: 1100 [IV Piggyback:1100] Out: -   GENERAL: Chronically ill-appearing, disheveled SKIN: Scattered ecchymoses and faint petechiae EYES: Conjunctiva are pale, no scleral icterus OROPHARYNX: Oral mucosa pale, no thrush or mucositis  LYMPH:  no palpable lymphadenopathy in the cervical, axillary or inguinal LUNGS: clear to auscultation and percussion with normal breathing effort HEART: Tachycardic, no lower extremity edema ABDOMEN:abdomen soft, non-tender and normal bowel sounds NEURO: alert & oriented x 3 with fluent speech, no focal motor/sensory deficits  LABORATORY DATA:  I have reviewed the data as listed CMP Latest Ref Rng & Units 04/30/2021 04/30/2021 12/02/2020  Glucose 70 - 99 mg/dL 93 96 97  BUN 6 - 20 mg/dL 8 8 7  Creatinine 0.44 - 1.00 mg/dL 1.01(H) 0.97 0.45  Sodium 135 - 145 mmol/L 132(L) 132(L) 141  Potassium 3.5 - 5.1 mmol/L 2.4(LL) 2.4(LL) 3.6  Chloride 98 - 111 mmol/L 96(L) 95(L) 108  CO2 22 - 32 mmol/L _0 Calcium 8.9 - 10.3  mg/dL 5.7(LL) 5.6(LL) 8.5(L)  Total Protein 6.5 - 8.1 g/dL 4.9(L) 5.1(L) 5.8(L)  Total Bilirubin 0.3 - 1.2 mg/dL 0.8 1.3(H) 0.4  Alkaline Phos 38 - 126 U/L 47 50 62  AST 15 - 41 U/L 48(H) 37 19  ALT 0 - 44 U/L _1 Lab Results  Component Value Date   WBC 26.3 (H) 04/30/2021   HGB 2.0 (LL) 04/30/2021   HCT 6.6 (L) 04/30/2021   MCV 124.5 (H) 04/30/2021   PLT 18 (LL) 04/30/2021   NEUTROABS 4.7 04/30/2021    DG Chest 2 View  Result Date: 04/30/2021 CLINICAL DATA:  55 year old female with chest pain. EXAM: CHEST - 2 VIEW COMPARISON:  Chest radiograph dated 11/29/2020. FINDINGS: No focal consolidation, pleural effusion, or pneumothorax. Mild cardiomegaly. No acute osseous pathology. IMPRESSION: No active cardiopulmonary disease. Electronically Signed   By: Anner Crete M.D.   On: 04/30/2021 01:31    ASSESSMENT AND PLAN: This is a 55 year old female with:  1.  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  The patient was found to have AML on her bone marrow biopsy performed in December 2021.  Unfortunately, she did not keep her follow-up appointments with [place].  She is not a candidate for induction chemotherapy.  I discussed the diagnosis and poor prognosis with the patient today.  She seems to lack insight into her underlying medical condition.  I have explained to her she is not a candidate for treatment for her AML.  Recommend supportive transfusions to keep hemoglobin above 7 transfuse platelets for platelet count less than 10,000 or active bleeding.  I would recommend a palliative care consult to discuss goals of care.  Prognosis for untreated AML is overall poor -estimated to be days to weeks.   LOS: 0 days   Mikey Bussing, DNP, AGPCNP-BC, AOCNP 04/30/21  Patient seen and examined personally.  Agree with note above.  55 year old woman with anemia and thrombocytopenia related to AML.  She is not a candidate for additional therapy beyond  supportive care.  Her overall prognosis is poor with limited life expectancy and I agree with supportive transfusion for symptomatic disease.  She would benefit from hospice enrollment upon her discharge.  35  minutes were dedicated to this visit.  50% of time was spent face-to-face and dedicated to reviewing laboratory data, imaging studies, discussing treatment options, and answering questions regarding future plan.

## 2021-04-30 NOTE — ED Triage Notes (Signed)
Pt bib [facility] for chest pain. Pt states she did have some intermittent chest pain. Family told EMS that pt was not eating or drinking recently.  Pt c/o knee pain. EMS VS 100/50 HR=100 Temp=100

## 2021-04-30 NOTE — ED Notes (Signed)
Report given Floyce Stakes, RN of [phone]

## 2021-04-30 NOTE — ED Provider Notes (Signed)
[facility] EMERGENCY DEPARTMENT Provider Note  CSN: [id number] Arrival date & time: 04/30/21 0004  Chief Complaint(s) Chest Pain  HPI Wendy Edwards is a 55 y.o. female who presents to the emergency department with generalized fatigue and intermittent dull chest pains.  Patient points to her lower sternum and epigastric region when asked to identify location.  Pain is short-lived lasting seconds and occurs every several hours.  No alleviating or aggravating factors.  Patient is endorsing associated shortness of breath.  She is also endorsing nausea without emesis.  No abdominal pain.  No diarrhea.  No melena.  Per EMS family was concerned that patient has not been eating or drinking recently.    HPI  Past Medical History Past Medical History:  Diagnosis Date  . 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 Aplastic anemia ([place])    hx/notes 05/09/2007  . Arthritis    "right knee" (02/21/2017)  . Asthma   . Chronic anxiety    Archie Endo 02/04/2016  . COPD (chronic obstructive pulmonary disease) ([place])    hx/notes 05/09/2007  . Daily headache   . Depression   . DVT (deep venous thrombosis) ([facility])    hx upper and lower extremeties/notes 05/09/2007  . GERD (gastroesophageal reflux disease)   . Goals of care, counseling/discussion 07/05/2020  . Heart murmur 1974  . History of blood transfusion    "related to my aplastic anemia"  . History of kidney stones   . Hypertension   . Migraine    "a few/year" (02/21/2017)  . Pneumonia 1974   hospitalized "w/double pneumonia"  . Serum sickness    hx/notes 05/09/2007  . Transaminitis    asymptomatic hx/notes 05/09/2007   Patient Active Problem List   Diagnosis Date Noted  . Hand pain, left 11/30/2020  . Macrocytic anemia 11/29/2020  . Elevated troponin 11/29/2020  . Goals of care, counseling/discussion 07/05/2020  . Cirrhosis of liver without ascites ([place])    . Pancytopenia ([place]) 06/26/2020  . Anxiety 06/26/2020  . GERD (gastroesophageal reflux disease) 06/26/2020  . Vitamin B12 deficiency 06/26/2020  . Pernicious anemia   . Thrombocytopenia ([place]) 06/25/2020  . Fall   . Aplastic anemia ([place])   . Altered mental status 09/25/2018  . Metabolic acidosis 36/64/4034  . Malnutrition ([place]) 09/25/2018  . ETOH abuse 09/25/2018  . Sepsis ([place]) 09/25/2018  . COPD (chronic obstructive pulmonary disease) ([place]) 09/25/2018  . Chronic bilateral low back pain without sciatica 07/11/2018  . Screening for breast cancer 07/11/2018  . 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 Pain due to knee joint prosthesis ([place]), right 02/16/2017  . Hx of aplastic anemia 10/31/2012  . HTN (hypertension) 06/19/2012  . Tobacco abuse 05/18/2012   Home Medication(s) Prior to Admission medications   Medication Sig Start Date End Date Taking? Authorizing Provider  acetaminophen (TYLENOL) 500 MG tablet Take 1,000 mg by mouth every 6 (six) hours as needed for mild pain.   Yes [provider]  PROAIR HFA 108 (90 Base) MCG/ACT inhaler INHALE 2 PUFFS BY MOUTH EVERY 6 HOURS AS NEEDED Patient taking differently: Inhale into the lungs every 6 (six) hours as needed for wheezing or shortness of breath. 12/24/18  Yes [place], Holli Humbles, NP  folic acid (FOLVITE) 1 MG tablet Take 1 tablet (1 mg total) by mouth daily. Patient not taking: Reported on 04/30/2021 12/02/20   Aline August, MD  mometasone-formoterol (DULERA) 100-5 MCG/ACT AERO Inhale 2  puffs into the lungs 2 (two) times daily. Patient not taking: Reported on 04/30/2021 12/02/20   Aline August, MD  Multiple Vitamin (MULTIVITAMIN WITH MINERALS) TABS tablet Take 1 tablet by mouth daily. Patient not taking: No sig reported 07/01/20   Eugenie Filler, MD  pantoprazole (PROTONIX) 40 MG tablet Take 1 tablet (40 mg total) by mouth daily at 6 (six) AM. Patient not taking: Reported on 04/30/2021 12/02/20   Aline August, MD  SYRINGE/NEEDLE, DISP, 1 ML (B-D SYRINGE/NEEDLE 1CC/25GX5/8) 25G X 5/8" 1 ML MISC 1,000 mcg by Does not apply route daily. 1cc BD syringe for vitamin B12 shots. Patient not taking: Reported on 04/30/2021 06/30/20   Eugenie Filler, MD  thiamine 100 MG tablet Take 1 tablet (100 mg total) by mouth daily. Patient not taking: Reported on 04/30/2021 12/02/20   Aline August, MD  umeclidinium bromide (INCRUSE ELLIPTA) 62.5 MCG/INH AEPB Inhale 1 puff into the lungs daily. Patient not taking: Reported on 04/30/2021 12/02/20   Aline August, MD  vitamin B-12 (CYANOCOBALAMIN) 1000 MCG tablet Take 2 tablets (2,000 mcg total) by mouth daily. Patient not taking: Reported on 04/30/2021 12/02/20   Aline August, MD                                                                                                                                    Past Surgical History Past Surgical History:  Procedure Laterality Date  . CYSTOSCOPY W/ URETEROSCOPY W/ LITHOTRIPSY    . EXTREMITY CYST EXCISION Left 2017   upper, inner arm  . KNEE ARTHROSCOPY Right 1985  . MEDIAL PARTIAL KNEE REPLACEMENT Right 2005  . TOTAL ABDOMINAL HYSTERECTOMY  2003   hx/notes 05/09/2007  . TOTAL KNEE REVISION Right 02/20/2017   Procedure: TOTAL KNEE REVISION;  Surgeon: Frederik Pear, MD;  Location: [facility];  Service: Orthopedics;  Laterality: Right;  . TUBAL LIGATION    . TUNNELED VENOUS CATHETER PLACEMENT Right    hx/notes 05/09/2007; "took it out ~ 1 wk later"   Family History Family History  Problem Relation Age of Onset  . Coronary artery disease Father 33       CABG  . Cancer Father   . Hypertension Mother   . Cancer Mother     Social History Social History   Tobacco Use  . Smoking status: Current Every Day Smoker    Packs/day: 0.50    Years: 34.00    Pack years: 17.00    Types: Cigarettes  . Smokeless tobacco: Never Used  Vaping Use  . Vaping Use: Some days  Substance Use Topics  . Alcohol use: Yes     Alcohol/week: 12.0 standard drinks    Types: 12 Cans of beer per week    Comment: previously a heavy drinker - 6-7 beers daily; last drank maybe a week ago  . Drug use: No   Allergies Bee venom and Phenergan [promethazine]  Review of Systems  Review of Systems All other systems are reviewed and are negative for acute change except as noted in the HPI  Physical Exam Vital Signs  I have reviewed the triage vital signs BP 99/66   Pulse (!) 112   Temp 98.7 F (37.1 C) (Rectal)   Resp 20   SpO2 100%   Physical Exam Vitals reviewed.  Constitutional:      General: She is in acute distress.     Appearance: She is well-developed and underweight. She is toxic-appearing. She is not diaphoretic.     Interventions: Nasal cannula in place.  HENT:     Head: Normocephalic and atraumatic.     Nose: Nose normal.  Eyes:     General: No scleral icterus.       Right eye: No discharge.        Left eye: No discharge.     Conjunctiva/sclera: Conjunctivae normal.     Pupils: Pupils are equal, round, and reactive to light.  Cardiovascular:     Rate and Rhythm: Regular rhythm. Tachycardia present.     Heart sounds: No murmur heard. No friction rub. No gallop.   Pulmonary:     Effort: Pulmonary effort is normal. No respiratory distress.     Breath sounds: Normal breath sounds. No stridor. No rales.  Abdominal:     General: There is no distension.     Palpations: Abdomen is soft.     Tenderness: There is no abdominal tenderness. There is no guarding or rebound.  Musculoskeletal:        General: No tenderness.     Cervical back: Normal range of motion and neck supple.  Skin:    General: Skin is warm and dry.     Coloration: Skin is pale.     Findings: No erythema or rash.     Comments: Yellow stained fingers  Neurological:     Mental Status: She is alert and oriented to person, place, and time.  Psychiatric:        Behavior: Behavior is cooperative.     ED Results and  Treatments Labs (all labs ordered are listed, but only abnormal results are displayed) Labs Reviewed  COMPREHENSIVE METABOLIC PANEL - Abnormal; Notable for the following components:      Result Value   Sodium 132 (*)    Potassium 2.4 (*)    Chloride 95 (*)    Calcium 5.6 (*)    Total Protein 5.1 (*)    Albumin 2.4 (*)    Total Bilirubin 1.3 (*)    All other components within normal limits  CBC WITH DIFFERENTIAL/PLATELET - Abnormal; Notable for the following components:   WBC 26.3 (*)    RBC 0.53 (*)    Hemoglobin 2.0 (*)    HCT 6.6 (*)    MCV 124.5 (*)    MCH 37.7 (*)    RDW 16.4 (*)    Platelets 18 (*)    nRBC 1.5 (*)    Lymphs Abs 11.3 (*)    Monocytes Absolute 2.6 (*)    Basophils Absolute 1.8 (*)    nRBC 4 (*)    All other components within normal limits  COMPREHENSIVE METABOLIC PANEL - Abnormal; Notable for the following components:   Sodium 132 (*)    Potassium 2.4 (*)    Chloride 96 (*)    Creatinine, Ser 1.01 (*)    Calcium 5.7 (*)    Total Protein 4.9 (*)    Albumin 2.3 (*)  AST 48 (*)    All other components within normal limits  TROPONIN I (HIGH SENSITIVITY) - Abnormal; Notable for the following components:   Troponin I (High Sensitivity) 114 (*)    All other components within normal limits  SARS CORONAVIRUS 2 (TAT 6-24 HRS)  CBC WITH DIFFERENTIAL/PLATELET  URINALYSIS, ROUTINE W REFLEX MICROSCOPIC  CBC WITH DIFFERENTIAL/PLATELET  PATHOLOGIST SMEAR REVIEW  POC OCCULT BLOOD, ED  TYPE AND SCREEN  PREPARE RBC (CROSSMATCH)  PREPARE FRESH FROZEN PLASMA                                                                                                                         EKG  EKG Interpretation  Date/Time:  Friday Apr 30 2021 00:28:51 EDT Ventricular Rate:  109 PR Interval:  150 QRS Duration: 100 QT Interval:  400 QTC Calculation: 538 R Axis:   81 Text Interpretation: Sinus tachycardia Low voltage QRS Incomplete right bundle branch block ST & T wave  abnormality, consider lateral ischemia Prolonged QT Abnormal ECG Confirmed by Addison Lank [phone]) on 04/30/2021 4:23:19 AM      Radiology DG Chest 2 View  Result Date: 04/30/2021 CLINICAL DATA:  55 year old female with chest pain. EXAM: CHEST - 2 VIEW COMPARISON:  Chest radiograph dated 11/29/2020. FINDINGS: No focal consolidation, pleural effusion, or pneumothorax. Mild cardiomegaly. No acute osseous pathology. IMPRESSION: No active cardiopulmonary disease. Electronically Signed   By: Anner Crete M.D.   On: 04/30/2021 01:31    Pertinent labs & imaging results that were available during my care of the patient were reviewed by me and considered in my medical decision making (see chart for details).  Medications Ordered in ED Medications  0.9 %  sodium chloride infusion (Manually program via Guardrails IV Fluids) (has no administration in time range)  calcium chloride 1 g in sodium chloride 0.9 % 100 mL IVPB (has no administration in time range)  potassium chloride 10 mEq in 100 mL IVPB (10 mEq Intravenous New Bag/Given 04/30/21 0600)  sodium chloride 0.9 % bolus 1,000 mL (0 mLs Intravenous Stopped 04/30/21 0542)                                                                                                                                    Procedures .1-3 Lead EKG Interpretation Performed by: Fatima Blank, MD Authorized by: Fatima Blank, MD     Interpretation: normal  ECG rate:  111   ECG rate assessment: tachycardic     Ectopy: none     Conduction: normal   .Critical Care Performed by: Fatima Blank, MD Authorized by: Fatima Blank, MD   Critical care provider statement:    Critical care time (minutes):  60   Critical care was necessary to treat or prevent imminent or life-threatening deterioration of the following conditions:  Circulatory failure and metabolic crisis   Critical care was time spent personally by me on the following  activities:  Discussions with consultants, evaluation of patient's response to treatment, examination of patient, ordering and performing treatments and interventions, ordering and review of laboratory studies, ordering and review of radiographic studies, pulse oximetry, re-evaluation of patient's condition, obtaining history from patient or surrogate and review of old charts    (including critical care time)  Medical Decision Making / ED Course I have reviewed the nursing notes for this encounter and the patient's prior records (if available in EHR or on provided paperwork).   RAFFINEE FIXLER was evaluated in Emergency Department on 04/30/2021 for the symptoms described in the history of present illness. She was evaluated in the context of the global COVID-19 pandemic, which necessitated consideration that the patient might be at risk for infection with the SARS-CoV-2 virus that causes COVID-19. Institutional protocols and algorithms that pertain to the evaluation of patients at risk for COVID-19 are in a state of rapid change based on information released by regulatory bodies including the CDC and federal and state organizations. These policies and algorithms were followed during the patient's care in the ED.  EKG without acute ischemic changes.  Did show prolonged QT. No dysrhythmias or new blocks. Chest x-ray without evidence suggestive of pneumonia, pneumothorax, pneumomediastinum.  No abnormal contour of the mediastinum to suggest dissection. No evidence of acute injuries.   Patient's labs notable for severe anemia with a hemoglobin of 2.0 and a hematocrit of 6.6.  Appears to be macrocytic.  Hemoglobin down 5 g since 4 months ago patient has thrombocytopenia but appears to be improved from 4 months ago.  Hemoccult was negative.  Patient denied any GI bleeds.  Normal bilirubin, doubt hemolysis.  Patient is not febrile and mentating well 5 low suspicion for TTP.   Additionally patient has  hypokalemia and hypocalcemia, confirmed with repeat labs. Patient's troponin was also elevated but this is likely due to demand ischemia from severe anemia.  Patient provided with IV fluids, 2 units of packed red blood cells and a unit of FFP.  Will admit for continued work-up and management.  Case discussed with Dr. Hal Hope for stepdown admission but he requested we discussed case with critical care to determine whether patient would be appropriate for the ICU.  Spoke with Dr. Melvyn Novas who will evaluate the patient and determine appropriate unit.      Final Clinical Impression(s) / ED Diagnoses Final diagnoses:  Chest pain, unspecified type  Anemia, unspecified type  Hypokalemia  Hypocalcemia  Elevated troponin      This chart was dictated using voice recognition software.  Despite best efforts to proofread,  errors can occur which can change the documentation meaning.   Fatima Blank, MD 04/30/21 [phone]

## 2021-04-30 NOTE — ED Notes (Signed)
Update given to son

## 2021-04-30 NOTE — H&P (Signed)
History and Physical    TOCARA MENNEN OZH:[id number] DOB: [birth year] DOA: 04/30/2021  Referring MD/NP/PA: Gean Birchwood, MD PCP: Patient, No Pcp Per (Inactive)  Patient coming from: Home (with son and his fiance)  Chief Complaint: Chest pain  I have personally briefly reviewed patient's old medical records in [place]   HPI: AJIAH MCGLINN is a 55 y.o. female with medical history significant of hypertension, aplastic anemia, COPD, chronic ambulatory dysfunction, anxiety, depression, alcohol abuse, tobacco abuse, and GERD presents with complaints of substernal chest pain over the last 3 to 4 days.  She complains of substernal chest pain that she describes as dull in nature and does not radiate.  Associated symptoms include some nausea and shortness of breath.  Denies having any vomiting, diaphoresis, abdominal pain, loss of consciousness, blood in stool, or leg swelling.  She any blood thinners and normally ibuprofen intermittently for headaches.  She has been drinking approximately a six pack of beer every other day, but not every day. At baseline she is bedbound and lives with her son and his fiance.  She continues to smoke less than a pack cigarettes per day on average and is not normally on oxygen at baseline.    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   ED Course: Upon admission into the emergency department patient was seen to be afebrile, pulse 10 1-1 17, blood pressures 86/75-1 12/61, and O2 saturations currently maintained on 2.5  L of nasal cannula oxygen.  Labs significant for WBC 26.3, hemoglobin 2, platelets 18, sodium 132, potassium 2.4, chloride 96 BUN 8, creatinine 1.01, calcium 5.7,  albumin 2.3, AST 48, and ALT 16.  Fecal occult stool was negative.  Peripheral smear was obtained and pending.  Showed mild cardiomegaly without any acute abnormality.  Patient was ordered 1 L normal saline IV fluids due to low blood pressures, calcium gluconate 1 g IV, potassium chloride 30 mEq IV, 2 units of packed red blood cells, 1 unit of fresh frozen plasma.  Review of Systems  Constitutional: Positive for malaise/fatigue. Negative for fever.  HENT: Negative for ear discharge and nosebleeds.   Eyes: Negative for photophobia and pain.  Respiratory: Positive for shortness of breath. Negative for cough.   Cardiovascular: Positive for chest pain. Negative for PND.  Gastrointestinal: Positive for nausea. Negative for blood in stool and vomiting.  Genitourinary: Negative for dysuria and hematuria.  Musculoskeletal: Negative for falls.  Neurological: Negative for focal weakness and loss of consciousness.  Psychiatric/Behavioral: Positive for substance abuse. Negative for memory loss.    Past Medical History:  Diagnosis Date  . 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 Aplastic anemia ([place])    hx/notes 05/09/2007  . Arthritis    "right knee" (02/21/2017)  . Asthma   . Chronic anxiety    Archie Endo 02/04/2016  . COPD (chronic obstructive pulmonary disease) ([place])    hx/notes 05/09/2007  . Daily headache   . Depression   . DVT (deep venous thrombosis) ([facility])    hx upper and lower extremeties/notes 05/09/2007  . GERD (gastroesophageal reflux disease)   . Goals of care, counseling/discussion 07/05/2020  . Heart murmur 1974  . History of blood transfusion    "  related to my aplastic anemia"  . History of kidney stones   . Hypertension   . Migraine    "a few/year" (02/21/2017)  . Pneumonia 1974   hospitalized "w/double pneumonia"  . Serum sickness    hx/notes 05/09/2007  . Transaminitis    asymptomatic hx/notes 05/09/2007     Past Surgical History:  Procedure Laterality Date  . CYSTOSCOPY W/ URETEROSCOPY W/ LITHOTRIPSY    . EXTREMITY CYST EXCISION Left 2017   upper, inner arm  . KNEE ARTHROSCOPY Right 1985  . MEDIAL PARTIAL KNEE REPLACEMENT Right 2005  . TOTAL ABDOMINAL HYSTERECTOMY  2003   hx/notes 05/09/2007  . TOTAL KNEE REVISION Right 02/20/2017   Procedure: TOTAL KNEE REVISION;  Surgeon: Frederik Pear, MD;  Location: [facility];  Service: Orthopedics;  Laterality: Right;  . TUBAL LIGATION    . TUNNELED VENOUS CATHETER PLACEMENT Right    hx/notes 05/09/2007; "took it out ~ 1 wk later"     reports that she has been smoking cigarettes. She has a 17.00 pack-year smoking history. She has never used smokeless tobacco. She reports current alcohol use of about 12.0 standard drinks of alcohol per week. She reports that she does not use drugs.  Allergies  Allergen Reactions  . Bee Venom Hives  . Phenergan [Promethazine] Other (See Comments)    "made me shaky"    Family History  Problem Relation Age of Onset  . Coronary artery disease Father 47       CABG  . Cancer Father   . Hypertension Mother   . Cancer Mother     Prior to Admission medications   Medication Sig Start Date End Date Taking? Authorizing Provider  acetaminophen (TYLENOL) 500 MG tablet Take 1,000 mg by mouth every 6 (six) hours as needed for mild pain.   Yes [provider]  PROAIR HFA 108 (90 Base) MCG/ACT inhaler INHALE 2 PUFFS BY MOUTH EVERY 6 HOURS AS NEEDED Patient taking differently: Inhale into the lungs every 6 (six) hours as needed for wheezing or shortness of breath. 12/24/18  Yes [place], Holli Humbles, NP  folic acid (FOLVITE) 1 MG tablet Take 1 tablet (1 mg total) by mouth daily. Patient not taking: Reported on 04/30/2021 12/02/20   Aline August, MD  mometasone-formoterol (DULERA) 100-5 MCG/ACT AERO Inhale 2 puffs into the lungs 2 (two) times daily. Patient not taking: Reported on 04/30/2021 12/02/20   Aline August, MD   Multiple Vitamin (MULTIVITAMIN WITH MINERALS) TABS tablet Take 1 tablet by mouth daily. Patient not taking: No sig reported 07/01/20   Eugenie Filler, MD  pantoprazole (PROTONIX) 40 MG tablet Take 1 tablet (40 mg total) by mouth daily at 6 (six) AM. Patient not taking: Reported on 04/30/2021 12/02/20   Aline August, MD  SYRINGE/NEEDLE, DISP, 1 ML (B-D SYRINGE/NEEDLE 1CC/25GX5/8) 25G X 5/8" 1 ML MISC 1,000 mcg by Does not apply route daily. 1cc BD syringe for vitamin B12 shots. Patient not taking: Reported on 04/30/2021 06/30/20   Eugenie Filler, MD  thiamine 100 MG tablet Take 1 tablet (100 mg total) by mouth daily. Patient not taking: Reported on 04/30/2021 12/02/20   Aline August, MD  umeclidinium bromide (INCRUSE ELLIPTA) 62.5 MCG/INH AEPB Inhale 1 puff into the lungs daily. Patient not taking: Reported on 04/30/2021 12/02/20   Aline August, MD  vitamin B-12 (CYANOCOBALAMIN) 1000 MCG tablet Take 2 tablets (2,000 mcg total) by mouth daily. Patient not taking: Reported on 04/30/2021 12/02/20   Aline August, MD  Physical Exam:  Constitutional: Chronically ill-appearing female who appears older than stated age 32:   04/30/21 0700 04/30/21 0730 04/30/21 0810 04/30/21 0825  BP: (!) 111/55 (!) 101/54 108/61 112/61  Pulse: (!) 109 (!) 106 (!) 110 (!) 112  Resp: _0 Temp:   98.3 F (36.8 C) 98.3 F (36.8 C)  TempSrc:   Oral Oral  SpO2: 100% 100% 100% 100%   Eyes: PERRL, lids and conjunctivae normal ENMT: Mucous membranes are moist. Posterior pharynx clear of any exudate or lesions.  Neck: normal, supple, no masses, no thyromegaly Respiratory: clear to auscultation bilaterally, no wheezing, no crackles. Normal respiratory effort. No accessory muscle use.  Cardiovascular: Tachycardic, no murmurs / rubs / gallops. No extremity edema. 2+ pedal pulses. No carotid bruits.  Abdomen: no tenderness, no masses palpated. No hepatosplenomegaly. Bowel sounds positive.   Musculoskeletal: no clubbing / cyanosis. No joint deformity upper and lower extremities. Good ROM, no contractures. Normal muscle tone.  Skin: Pallor present. Neurologic: CN 2-12 grossly intact. Sensation intact, DTR normal. Strength 5/5 in all 4.  Psychiatric: Normal judgment and insight. Alert and oriented x 3. Normal mood.     Labs on Admission: I have personally reviewed following labs and imaging studies  CBC: Recent Labs  Lab 04/30/21 0426  WBC 26.3*  NEUTROABS 4.7  HGB 2.0*  HCT 6.6*  MCV 124.5*  PLT 18*   Basic Metabolic Panel: Recent Labs  Lab 04/30/21 0227 04/30/21 0426  NA 132* 132*  K 2.4* 2.4*  CL 95* 96*  CO2 24 23  GLUCOSE 96 93  BUN 8 8  CREATININE 0.97 1.01*  CALCIUM 5.6* 5.7*   GFR: CrCl cannot be calculated (Unknown ideal weight.). Liver Function Tests: Recent Labs  Lab 04/30/21 0227 04/30/21 0426  AST 37 48*  ALT 14 16  ALKPHOS 50 47  BILITOT 1.3* 0.8  PROT 5.1* 4.9*  ALBUMIN 2.4* 2.3*   No results for input(s): LIPASE, AMYLASE in the last 168 hours. No results for input(s): AMMONIA in the last 168 hours. Coagulation Profile: No results for input(s): INR, PROTIME in the last 168 hours. Cardiac Enzymes: No results for input(s): CKTOTAL, CKMB, CKMBINDEX, TROPONINI in the last 168 hours. BNP (last 3 results) No results for input(s): PROBNP in the last 8760 hours. HbA1C: No results for input(s): HGBA1C in the last 72 hours. CBG: No results for input(s): GLUCAP in the last 168 hours. Lipid Profile: No results for input(s): CHOL, HDL, LDLCALC, TRIG, CHOLHDL, LDLDIRECT in the last 72 hours. Thyroid Function Tests: No results for input(s): TSH, T4TOTAL, FREET4, T3FREE, THYROIDAB in the last 72 hours. Anemia Panel: No results for input(s): VITAMINB12, FOLATE, FERRITIN, TIBC, IRON, RETICCTPCT in the last 72 hours. Urine analysis:    Component Value Date/Time   COLORURINE YELLOW 01/13/2019 1822   APPEARANCEUR CLEAR 01/13/2019 1822    LABSPEC 1.020 01/13/2019 1822   PHURINE 6.0 01/13/2019 1822   GLUCOSEU NEGATIVE 01/13/2019 1822   HGBUR NEGATIVE 01/13/2019 1822   BILIRUBINUR NEGATIVE 01/13/2019 1822   KETONESUR 80 (A) 01/13/2019 1822   PROTEINUR 30 (A) 01/13/2019 1822   UROBILINOGEN 0.2 11/11/2010 0327   NITRITE POSITIVE (A) 01/13/2019 1822   LEUKOCYTESUR NEGATIVE 01/13/2019 1822   Sepsis Labs: No results found for this or any previous visit (from the past 240 hour(s)).   Radiological Exams on Admission: DG Chest 2 View  Result Date: 04/30/2021 CLINICAL DATA:  55 year old female with chest pain. EXAM: CHEST - 2 VIEW COMPARISON:  Chest radiograph dated 11/29/2020. FINDINGS: No focal consolidation, pleural effusion, or pneumothorax. Mild cardiomegaly. No acute osseous pathology. IMPRESSION: No active cardiopulmonary disease. Electronically Signed   By: Anner Crete M.D.   On: 04/30/2021 01:31    EKG: Independently reviewed.  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  Patient presents with hemoglobin of 2 g/dL with MCV 124.5, MCH 37.7, and negative stool guaiacs.  Records note prior history of vitamin B12 deficiency likely related with patient's abdominal.  She was typed and screened for possible need of blood products and ordered to be transfused 2 units of packed red blood cells and 1 unit of fresh frozen plasma.  Peripheral smear has been initially obtained.  She is currently receiving her first unit of blood and therefore at this time unclear utility of obtaining additional labs.  Risk factors for bleeding include intermittent use of ibuprofen and alcohol abuse. 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  For which patient is leukocytosis, macrocytic anemia and thrombocytopenia may likely all be related. -Admit to a progressive bed -Continue with transfusion of 2 units of packed red blood cells,  but plan on transfusing at least and additional 2 units of PRBCs if hematology agrees -Serial monitoring of blood counts -Hematology oncology consulted, will follow-up for any further recommendations -Overall patient's progress appears to be poor and will formally consult palliative care  Leukocytosis: WBC elevated at 26.3.  Chest x-ray did not show any acute abnormality.  Suspect likely related with above. -Follow-up urinalysis  -Continue to monitor white blood cell count -Follow-up recommendation per hematology oncology  Chest pain elevated troponin: Acute.  Patient reports having substernal chest pain that is dull in nature and does not radiate.  Labs significant for troponin 114.  EKG without any significant ischemic changes.  Chest x-ray noted some mild cardiomegaly possibly related to history of alcohol abuse and/or anemia last.  Echocardiogram was from 2013 which did LV global systolic function was normal at that time without any significant wall motion abnormalities.  Patient is likely not a candidate for anticoagulation at this point in time. -Check echocardiogram -Continue supportive care at this time  Prolonged QT interval: Acute.  QTC 538 on admission. -Avoid QT prolonging medications at this time -Correcting electrolyte abnormality   Hypokalemia: Acute.  Initial potassium 2.4.  Patient was ordered 30 mEq of potassium chloride IV. -Check magnesium level -Give potassium chloride 40 mEq p.o. twice daily x2 doses -Will continue to monitor and replace as needed  Hypocalcemia: Acute.  Calcium 5.6 initially been transfused 1 g of calcium gluconate IV. -Give additional 2 g of calcium gluconate IV -Will continue to monitor and replace as needed  Thrombocytopenia: Acute on chronic.  Platelet count currently 18.  No reports of bleeding. -Continue to trend platelet counts -We will transfuse platelets if platelet count drops below 10,000 without bleeding and  Transaminitis: Acute.  On  admission AST 48 and ALT 16.  The AST to ALT ratio is consistent with alcohol abuse. -Continue to monitor  Alcohol abuse: Chronic.  Patient reports drinking a sixpack of beer every other day, but denies drinking on a daily basis. -CIWA protocols initiated with as needed Ativan -Thiamine, folate, and multivitamin -Continue to counsel on cessation of alcohol abuse  Tobacco abuse: Chronic.  Patient reports smoking <1ppd cigarettes on average. -Nicotine patch offered -Continue to counsel on need of cessation of  tobacco use  GERD -Protonix on hold due to prolonged QT interval -Carafate 3 times daily for now  DVT prophylaxis: SCD   Code Status: Full Family Communication: Attempted to contact son over the phone no answer Disposition Plan: To be determined Consults called: Hematology oncology Admission status: Inpatient, require more than 2 midnight stay  Norval Morton MD [facility]   If 7PM-7AM, please contact night-coverage   04/30/2021, 8:32 AM

## 2021-04-30 NOTE — Consult Note (Addendum)
Consultation Note Date: 04/30/2021   Patient Name: Wendy Edwards  DOB: 1966-03-17  MRN: [id number]  Age / Sex: 55 y.o., female  PCP: Patient, No Pcp Per (Inactive) Referring Physician: Rise Patience, MD  Reason for Consultation: Establishing goals of care, "Needs GOC discussion. 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 Has not kept appts. Not a treatment candidate. Recommend Hospice."  HPI/Patient Profile: 55 y.o. female  with past medical history of EtOH abuse, COPD, AML (diagnosed 11/2020) presented to [facility] ED from home with complaints of chest pain.  Work-up in ED showed hemoglobin 2.0.  Medical Oncology was consulted -patient had been seen by their service during previous hospitalization in December 2021.  Unfortunately, patient did not keep her follow-up appointments with them.  She is not a candidate for induction chemotherapy.  They gave recommendation for supportive transfusions and feel her overall prognosis is poor with life expectancy estimated to be days to weeks -recommendation given for hospice care at discharge. Patient was admitted on 04/30/2021 with severe anemia and thrombocytopenia with leukocytosis secondary to AML.   ED Course: Upon admission into the emergency department patient was seen to be afebrile, pulse 10 1-1 17, blood pressures 86/75-1 12/61, and O2 saturations currently maintained on 2.5  L of nasal cannula oxygen.  Labs significant for WBC 26.3, hemoglobin 2, platelets 18, sodium 132, potassium 2.4, chloride 96 BUN 8, creatinine 1.01, calcium 5.7, albumin 2.3, AST 48, and ALT 16.  Fecal occult stool was negative.  Peripheral smear was obtained and pending.  Showed mild cardiomegaly without any acute abnormality.  Patient was ordered 1 L normal saline IV fluids due to low blood pressures, calcium gluconate 1 g IV, potassium chloride 30 mEq IV, 2 units of packed red blood cells, 1 unit of fresh frozen  plasma.  Clinical Assessment and Goals of Care: I have reviewed medical records including EPIC notes, labs, and imaging. Received report from primary RN -no acute concerns.  Went to visit patient at bedside -no family/visitors present. Patient was lying in bed awake, alert, oriented, and able to participate in conversation. No respiratory distress, increased work of breathing, or secretions noted.  Patient denies pain but does endorse nausea/vomiting.  Patient states that nausea is her biggest concern at the moment.  She finished a blood transfusion during my visit.  Met with patient to discuss diagnosis, prognosis, GOC, EOL wishes, disposition, and options.  I introduced Palliative Medicine as specialized medical care for people living with serious illness. It focuses on providing relief from the symptoms and stress of a serious illness. The goal is to improve quality of life for both the patient and the family.  We discussed a brief life review of the patient as well as functional and nutritional status. Wendy Edwards is not married but does have 3 sons with whom she is close with.  Prior to hospitalization, she was living at home with her son Wendy Edwards and his fiance.  Her other 2 sons live out of state.  At home patient states  she was able to ambulate with a walker/cane but endorses feeling very weak.  She was able to dress and bathe herself.  As to nutritional intake, patient states she has "some good days and some bad days."  She states her nausea has been so bad recently that she has been unable to adequately eat/drink or keep anything down.  We discussed patient's current illness and what it means in the larger context of patient's on-going co-morbidities.  Patient does have a somewhat clear understanding of her current acute medical situation.  She states "I put things in the back of my head and forget about it."  We reviewed that unfortunately she has no other medical treatment options other than  supportive care for her AML. Natural disease trajectory and expectations at EOL were discussed. I attempted to elicit values and goals of care important to the patient. The difference between aggressive medical intervention and comfort care was considered in light of the patient's goals of care.  We discussed the management of her nausea in context of QTc -at this time patient states that she would rather manage her nausea/focus on quality of life.   We talked about transition to comfort measures in house and what that would entail inclusive of medications to control pain, dyspnea, agitation, nausea, itching, and hiccups. We discussed stopping all unnecessary measures such as blood draws, needle sticks, oxygen, antibiotics, CBGs/insulin, cardiac monitoring, and frequent vital signs.  Patient is agreeable for transition to full comfort care in house today.  She expresses appreciation that we can work on managing her nausea and anxiety.  She is agreeable to continue blood transfusions for symptom management to feel as best she can prior to discharge and continue lab monitoring.  Hospice services outpatient were explained and offered.  Patient is agreeable for hospice services at discharge.  Her son works third shift and may not be able to provide 24/7 care she likely will need going forward as it is anticipated she will continue to decline.  She is agreeable for referral to residential hospice -she states she has "heard great things about [facility]" and requests referral to this facility.   Advance directives, concepts specific to code status, artificial feeding and hydration, and rehospitalization were considered and discussed.  Patient does not have a living will or healthcare power of attorney.  She does state that she would want her son Wendy Edwards to make medical decisions on her behalf if she was ever unable.  Patient states that she "does not like being in hospitals" and her goal after discharge is to not  be rehospitalized.  Encouraged patient to consider DNR/DNI status understanding evidenced based poor outcomes in similar hospitalized patient, as the cause of arrest is likely associated with advanced chronic/terminal illness rather than an easily reversible acute cardio-pulmonary event. I explained that DNR/DNI does not change the medical plan and it only comes into effect after a person has arrested (died).  It is a protective measure to keep [place] from harming the patient in their last moments of life. Patient was agreeable to DNR/DNI with understanding that she would not receive CPR, defibrillation, ACLS medications, or intubation.   Offered visit from chaplain -patient would appreciate prayer and emotional support.  She is of [employer].  Visit also consisted of discussions dealing with the complex and emotionally intense issues of symptom management and palliative care in the setting of serious and life-threatening illness. Palliative care team will continue to support patient, patient's family, and medical  team.  Discussed with patient the importance of continued conversation with family and the medical providers regarding overall plan of care and treatment options, ensuring decisions are within the context of the patient's values and GOCs.    Questions and concerns were addressed. The patient/family was encouraged to call with questions and/or concerns.   7:12 PM Attempted to call patient's son/[name] Cipriani at home and on mobile to to provide updates on [place] conversation with patient today - no answer - unable to leave voicemail.   **ADDENDUM** 7:59 PM Noted different phone number for son listed under consult for TOC - [phone] - attempted to call with no answer - confidential voicemail left and PMT phone number provided with request to return call tomorrow 5/14 and noted that PMT will also reach back out again tomorrow 5/14.   Primary Decision Maker: PATIENT    SUMMARY OF  RECOMMENDATIONS: Initiated full comfort measures Initiated DNR/DNI  Patient agreeable for referral to [facility] consult placed Patient would like to continue blood transfusions for symptom management  to feel as best she can prior to discharge; she is okay to continue lab monitoring for H/H at this time Added orders for EOL symptom management and to reflect full comfort measures, as well as discontinued orders that were not focused on comfort Chaplain consult placed for: Patient request for prayer and emotional support Unrestricted visitation orders were placed per current [facility] EOL visitation policy  Nursing to provide frequent assessments and administer PRN medications as clinically necessary to ensure EOL comfort PMT will continue to follow and support holistically  Code Status/Advance Care Planning:  DNR  Palliative Prophylaxis:   Aspiration, Bowel Regimen, Delirium Protocol, Frequent Pain Assessment, Oral Care and Turn Reposition  Additional Recommendations (Limitations, Scope, Preferences):  Full Comfort Care  Psycho-social/Spiritual:   Desire for further Chaplaincy support:yes Created space and opportunity for patient and family to express thoughts and feelings regarding patient's current medical situation.   Emotional support and therapeutic listening provided.  Prognosis:   Days to weeks  Discharge Planning: Hospice facility      Primary Diagnoses: Present on Admission: . Anemia . Acute myeloid leukemia ([place]) . Tobacco abuse . Thrombocytopenia ([place]) . Hypokalemia . Hypocalcemia . Transaminitis   I have reviewed the medical record, interviewed the patient and family, and examined the patient. The following aspects are pertinent.  Past Medical History:  Diagnosis Date  . 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 Aplastic anemia ([place])    hx/notes 05/09/2007   . Arthritis    "right knee" (02/21/2017)  . Asthma   . Chronic anxiety    Archie Endo 02/04/2016  . COPD (chronic obstructive pulmonary disease) ([place])    hx/notes 05/09/2007  . Daily headache   . Depression   . DVT (deep venous thrombosis) ([facility])    hx upper and lower extremeties/notes 05/09/2007  . GERD (gastroesophageal reflux disease)   . Goals of care, counseling/discussion 07/05/2020  . Heart murmur 1974  . History of blood transfusion    "related to my aplastic anemia"  . History of kidney stones   . Hypertension   . Migraine    "a few/year" (02/21/2017)  . Pneumonia 1974   hospitalized "w/double pneumonia"  . Serum sickness    hx/notes 05/09/2007  . Transaminitis    asymptomatic hx/notes 05/09/2007   Social History   Socioeconomic History  . Marital status: Legally Separated  Spouse name: Not on file  . Number of children: 3  . Years of education: Not on file  . Highest education level: Not on file  Occupational History  . Occupation: disabled    Fish farm manager: UNEMPLOYED  Tobacco Use  . Smoking status: Current Every Day Smoker    Packs/day: 0.50    Years: 34.00    Pack years: 17.00    Types: Cigarettes  . Smokeless tobacco: Never Used  Vaping Use  . Vaping Use: Some days  Substance and Sexual Activity  . Alcohol use: Yes    Alcohol/week: 12.0 standard drinks    Types: 12 Cans of beer per week    Comment: previously a heavy drinker - 6-7 beers daily; last drank maybe a week ago  . Drug use: No  . Sexual activity: Not Currently  Other Topics Concern  . Not on file  Social History Narrative   Lives with brother.   Social Determinants of Health   Financial Resource Strain: Not on file  Food Insecurity: Not on file  Transportation Needs: Not on file  Physical Activity: Not on file  Stress: Not on file  Social Connections: Not on file   Family History  Problem Relation Age of Onset  . Coronary artery disease Father 58       CABG  . Cancer Father   .  Hypertension Mother   . Cancer Mother    Scheduled Meds: . folic acid  1 mg Oral Daily  . multivitamin with minerals  1 tablet Oral Daily  . nicotine  21 mg Transdermal Daily  . potassium chloride  40 mEq Oral BID  . sodium chloride flush  3 mL Intravenous Q12H  . sucralfate  1 g Oral TID WC & HS  . thiamine  100 mg Oral Daily   Or  . thiamine  100 mg Intravenous Daily   Continuous Infusions: PRN Meds:.acetaminophen **OR** acetaminophen, albuterol, LORazepam **OR** LORazepam, morphine injection Medications Prior to Admission:  Prior to Admission medications   Medication Sig Start Date End Date Taking? Authorizing Provider  acetaminophen (TYLENOL) 500 MG tablet Take 1,000 mg by mouth every 6 (six) hours as needed for mild pain.   Yes [provider]  PROAIR HFA 108 (90 Base) MCG/ACT inhaler INHALE 2 PUFFS BY MOUTH EVERY 6 HOURS AS NEEDED Patient taking differently: Inhale into the lungs every 6 (six) hours as needed for wheezing or shortness of breath. 12/24/18  Yes [place], Holli Humbles, NP  folic acid (FOLVITE) 1 MG tablet Take 1 tablet (1 mg total) by mouth daily. Patient not taking: Reported on 04/30/2021 12/02/20   Aline August, MD  mometasone-formoterol (DULERA) 100-5 MCG/ACT AERO Inhale 2 puffs into the lungs 2 (two) times daily. Patient not taking: Reported on 04/30/2021 12/02/20   Aline August, MD  Multiple Vitamin (MULTIVITAMIN WITH MINERALS) TABS tablet Take 1 tablet by mouth daily. Patient not taking: No sig reported 07/01/20   Eugenie Filler, MD  pantoprazole (PROTONIX) 40 MG tablet Take 1 tablet (40 mg total) by mouth daily at 6 (six) AM. Patient not taking: Reported on 04/30/2021 12/02/20   Aline August, MD  SYRINGE/NEEDLE, DISP, 1 ML (B-D SYRINGE/NEEDLE 1CC/25GX5/8) 25G X 5/8" 1 ML MISC 1,000 mcg by Does not apply route daily. 1cc BD syringe for vitamin B12 shots. Patient not taking: Reported on 04/30/2021 06/30/20   Eugenie Filler, MD  thiamine 100 MG  tablet Take 1 tablet (100 mg total) by mouth daily. Patient not taking:  Reported on 04/30/2021 12/02/20   Aline August, MD  umeclidinium bromide (INCRUSE ELLIPTA) 62.5 MCG/INH AEPB Inhale 1 puff into the lungs daily. Patient not taking: Reported on 04/30/2021 12/02/20   Aline August, MD  vitamin B-12 (CYANOCOBALAMIN) 1000 MCG tablet Take 2 tablets (2,000 mcg total) by mouth daily. Patient not taking: Reported on 04/30/2021 12/02/20   Aline August, MD   Allergies  Allergen Reactions  . Bee Venom Hives  . Phenergan [Promethazine] Other (See Comments)    "made me shaky"   Review of Systems  Constitutional: Positive for activity change, appetite change and fatigue.  Respiratory: Negative for cough and shortness of breath.   Gastrointestinal: Positive for nausea and vomiting.  Neurological: Positive for weakness.  All other systems reviewed and are negative.   Physical Exam Vitals and nursing note reviewed.  Constitutional:      General: She is not in acute distress.    Appearance: She is cachectic. She is ill-appearing.  Pulmonary:     Effort: No respiratory distress.  Skin:    General: Skin is warm and dry.     Coloration: Skin is pale.  Neurological:     Mental Status: She is alert and oriented to person, place, and time.     Motor: Weakness present.  Psychiatric:        Attention and Perception: Attention normal.        Behavior: Behavior is cooperative.        Cognition and Memory: Cognition and memory normal.     Vital Signs: BP 124/75   Pulse (!) 110   Temp 98.3 F (36.8 C) (Oral)   Resp 17   SpO2 97%  Pain Scale: 0-10   Pain Score: 5    SpO2: SpO2: 97 % O2 Device:SpO2: 97 % O2 Flow Rate: .O2 Flow Rate (L/min): 0 L/min  IO: Intake/output summary:   Intake/Output Summary (Last 24 hours) at 04/30/2021 1803 Last data filed at 04/30/2021 1448 Gross per 24 hour  Intake 3548 ml  Output --  Net 3548 ml    LBM:   Baseline Weight:   Most recent weight:        Palliative Assessment/Data: PPS 10 to 20%.     Time In: 1815 Time Out: 1927 Time Total: 72 minutes  Greater than 50%  of this time was spent counseling and coordinating care related to the above assessment and plan.  Signed by: Lin Landsman, NP   Please contact Palliative Medicine Team phone at [phone] for questions and concerns.  For individual provider: See Amion  *Portions of this note are a verbal dictation therefore any spelling and/or grammatical errors are due to the "[employer]" system interpretation.

## 2021-04-30 NOTE — ED Provider Notes (Addendum)
  Emergency Medicine Provider in Triage Note   MSE was initiated and I personally evaluated the patient  12:36 AM on Apr 30, 2021 as provider in triage.   Chief Complaint: chest pain  HPI  Patient is a 55 y.o. who presents to the ED with complaints of chest pain intermittently for past few days. Brief, no alleviating/aggravating factors, no pain @ present. Family expressed concern about her poor PO intake.    Review of Systems  Positive: Chest pain Negative: Abdominal pain, dyspnea, syncope, melena, hematochezia  Physical Exam  BP (!) 90/49 (BP Location: Left Arm)   Pulse (!) 101   Temp 98.1 F (36.7 C) (Oral)   Resp 16   SpO2 94%    Gen:   Awake, bed bond HEENT:  Atraumatic Resp:  Normal effort  Cardiac:  HR 101 Abd:   Nondistended, nontender  Neuro:  Speech clear  Other: Conjunctival pallor noted.   Medical Decision Making   Initiation of care has begun. The patient has been counseled on the process, plan, and necessity for staying for the completion/evaluation, informed that the remainder of the evaluation will be completed by another provider, this initial triage assessment does not replace that evaluation, and the importance of remaining in the ED until their evaluation is complete.   Clinical Impression  Chest pain        Amaryllis Dyke, PA-C 04/30/21 0038  Difficulty with labs, had to be redrawn in triage resulting in delay, held in triage, remains alert, labs starting to come back- significant hypokalemia/hypocalacemia. Troponin elevated @ 114, CBC remains pending- anticipate patient will be significantly anemic given her pallor on exam. She is being moved to next acute care bed per discussion with charge nurse.     Amaryllis Dyke, PA-C 04/30/21 0544    Fatima Blank, MD 05/03/21 2102

## 2021-04-30 NOTE — ED Notes (Signed)
Pt stated her stomach is hurting in the middle. She feels nauseous. Provider notified.

## 2021-04-30 NOTE — ED Notes (Signed)
Critical labs reported to Dr. Leonette Monarch

## 2021-05-01 DIAGNOSIS — D649 Anemia, unspecified: Secondary | ICD-10-CM | POA: Diagnosis not present

## 2021-05-01 DIAGNOSIS — R638 Other symptoms and signs concerning food and fluid intake: Secondary | ICD-10-CM

## 2021-05-01 DIAGNOSIS — R079 Chest pain, unspecified: Secondary | ICD-10-CM | POA: Diagnosis not present

## 2021-05-01 DIAGNOSIS — C9200 Acute myeloblastic leukemia, not having achieved remission: Secondary | ICD-10-CM | POA: Diagnosis not present

## 2021-05-01 DIAGNOSIS — E876 Hypokalemia: Secondary | ICD-10-CM | POA: Diagnosis not present

## 2021-05-01 DIAGNOSIS — D696 Thrombocytopenia, unspecified: Secondary | ICD-10-CM | POA: Diagnosis not present

## 2021-05-01 LAB — TYPE AND SCREEN
ABO/RH(D): O POS
Antibody Screen: NEGATIVE
Unit division: 0
Unit division: 0
Unit division: 0
Unit division: 0

## 2021-05-01 LAB — BPAM RBC
Blood Product Expiration Date: 202206082359
Blood Product Expiration Date: 202206082359
Blood Product Expiration Date: 202206082359
Blood Product Expiration Date: 202206082359
ISSUE DATE / TIME: 202205130606
ISSUE DATE / TIME: 202205130606
ISSUE DATE / TIME: 202205131307
ISSUE DATE / TIME: 202205131554
Unit Type and Rh: 5100
Unit Type and Rh: 5100
Unit Type and Rh: 5100
Unit Type and Rh: 5100

## 2021-05-01 LAB — CBC
HCT: 27.6 % — ABNORMAL LOW (ref 36.0–46.0)
Hemoglobin: 9.4 g/dL — ABNORMAL LOW (ref 12.0–15.0)
MCH: 29.1 pg (ref 26.0–34.0)
MCHC: 34.1 g/dL (ref 30.0–36.0)
MCV: 85.4 fL (ref 80.0–100.0)
Platelets: 23 10*3/uL — CL (ref 150–400)
RBC: 3.23 MIL/uL — ABNORMAL LOW (ref 3.87–5.11)
RDW: 22.5 % — ABNORMAL HIGH (ref 11.5–15.5)
WBC: 28.2 10*3/uL — ABNORMAL HIGH (ref 4.0–10.5)
nRBC: 2 % — ABNORMAL HIGH (ref 0.0–0.2)

## 2021-05-01 LAB — PREPARE FRESH FROZEN PLASMA: Unit division: 0

## 2021-05-01 LAB — COMPREHENSIVE METABOLIC PANEL
ALT: 25 U/L (ref 0–44)
AST: 60 U/L — ABNORMAL HIGH (ref 15–41)
Albumin: 2.5 g/dL — ABNORMAL LOW (ref 3.5–5.0)
Alkaline Phosphatase: 78 U/L (ref 38–126)
Anion gap: 11 (ref 5–15)
BUN: 9 mg/dL (ref 6–20)
CO2: 25 mmol/L (ref 22–32)
Calcium: 8 mg/dL — ABNORMAL LOW (ref 8.9–10.3)
Chloride: 103 mmol/L (ref 98–111)
Creatinine, Ser: 0.94 mg/dL (ref 0.44–1.00)
GFR, Estimated: >60 mL/min (ref >60)
Glucose, Bld: 123 mg/dL — ABNORMAL HIGH (ref 70–99)
Potassium: 2.8 mmol/L — ABNORMAL LOW (ref 3.5–5.1)
Sodium: 139 mmol/L (ref 135–145)
Total Bilirubin: 1.4 mg/dL — ABNORMAL HIGH (ref 0.3–1.2)
Total Protein: 5.5 g/dL — ABNORMAL LOW (ref 6.5–8.1)

## 2021-05-01 LAB — BPAM FFP
Blood Product Expiration Date: 202205162359
ISSUE DATE / TIME: 202205130606
Unit Type and Rh: 6200

## 2021-05-01 MED ORDER — PROCHLORPERAZINE EDISYLATE 10 MG/2ML IJ SOLN
10.0000 mg | Freq: Four times a day (QID) | INTRAMUSCULAR | Status: DC | PRN
  Filled 2021-05-01: qty 2

## 2021-05-01 NOTE — Progress Notes (Signed)
Daily Progress Note   Patient Name: Wendy Edwards       Date: 05/01/2021 DOB: 10/17/1966  Age: 55 y.o. MRN#: [id number] Attending Physician: Patrecia Pour, MD Primary Care Physician: Patient, No Pcp Per (Inactive) Admit Date: 04/30/2021  Reason for Consultation/Follow-up: Disposition, Non pain symptom management, Pain control, Psychosocial/spiritual support and Terminal Care  Subjective: Chart review performed. Received report from primary RN - no acute concerns. RN reports patient is lethargic and only taking in minimal sips of liquid - otherwise no oral intake.   Went to visit patient at bedside - no family/visitors present. NT just finished changing bed pad when I arrived. Patient was lying in bed awake, alert, oriented, and able to participate in conversation - Wendy Edwards states Wendy Edwards is very tired and appears very sleepy. Wendy Edwards states her nausea has improved but still is bothering her. No signs or non-verbal gestures of pain or discomfort noted. No respiratory distress, increased work of breathing, or secretions noted.   Provided emotional support. We gently discussed her thoughts on future blood transfusions vs letting nature take it's course - Wendy Edwards does not wish for further transfusions.  Wendy Edwards began falling asleep during my visit.  3:50 PM Attempted to call son/[name] to discuss diagnosis, prognosis, and patient's EOL wishes - no answer - confidential voicemail left and PMT phone number provided with request to return call for urgent updates.  4:03 PM Received notification [name] had returned call. Called [name] back. 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 [name] states that he was unaware of patient's diagnosis. Reviewed current hospital  course, including oncology consult. Gently informed [name] that there are no other medical treatment options other than supportive care for patient at this time and hospice was recommended. He was understandably tearful and seemed overwhelmed. Allowed time and space for [name] to process information. Reviewed patient's decision for full comfort measures and request for transfer to [facility]. [name] states he will be coming to hospital to see patient today. Education provided on current EOL visitation policy. Emotional support and therapeutic listening provided as [name] received difficult news today.  All questions and concerns addressed. Encouraged to call with questions and/or concerns. PMT number previously provided.   Length of Stay: 1  Current Medications: Scheduled Meds:  . nicotine  21 mg Transdermal Daily  . sodium chloride flush  3 mL Intravenous Q12H  . sucralfate  1 g Oral TID WC & HS    Continuous Infusions:   PRN Meds: acetaminophen **OR** acetaminophen, albuterol, antiseptic oral rinse, glycopyrrolate **OR** glycopyrrolate **OR** glycopyrrolate, haloperidol **OR** haloperidol **OR** haloperidol lactate, LORazepam **OR** LORazepam **OR** LORazepam, LORazepam **OR** LORazepam, morphine injection, ondansetron **OR** ondansetron (ZOFRAN) IV, polyvinyl alcohol  Physical Exam Vitals and nursing note reviewed.  Constitutional:      General: Wendy Edwards is not in acute distress.    Appearance: Wendy Edwards is ill-appearing.  Pulmonary:     Effort: No respiratory distress.  Skin:    General: Skin is warm and dry.  Neurological:     Mental Status: Wendy Edwards is alert and oriented to person, place, and time.     Motor: Weakness present.  Psychiatric:        Attention and Perception: Attention normal.        Behavior: Behavior is cooperative.        Cognition and Memory: Cognition and memory normal.             Vital Signs: BP 116/79 (BP Location: Left Arm)   Pulse (!) 109   Temp 99.2 F (37.3  C)   Resp 20   SpO2 97%  SpO2: SpO2: 97 % O2 Device: O2 Device: Room Air O2 Flow Rate: O2 Flow Rate (L/min): 0 L/min  Intake/output summary:   Intake/Output Summary (Last 24 hours) at 05/01/2021 1442 Last data filed at 05/01/2021 1300 Gross per 24 hour  Intake 2721.33 ml  Output --  Net 2721.33 ml   LBM:   Baseline Weight:   Most recent weight:         Palliative Assessment/Data: PPS 20%      Patient Active Problem List   Diagnosis Date Noted  . Anemia 04/30/2021  . 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 Hypokalemia 04/30/2021  . Hypocalcemia 04/30/2021  . Hand pain, left 11/30/2020  . Macrocytic anemia 11/29/2020  . Transaminitis 11/29/2020  . Goals of care, counseling/discussion 07/05/2020  . Cirrhosis of liver without ascites ([place])   . Pancytopenia ([place]) 06/26/2020  . Anxiety 06/26/2020  . GERD (gastroesophageal reflux disease) 06/26/2020  . Vitamin B12 deficiency 06/26/2020  . Pernicious anemia   . Thrombocytopenia ([place]) 06/25/2020  . Fall   . Aplastic anemia ([place])   . Altered mental status 09/25/2018  . Metabolic acidosis 93/71/6967  . Malnutrition ([place]) 09/25/2018  . ETOH abuse 09/25/2018  . Sepsis ([place]) 09/25/2018  . COPD (chronic obstructive pulmonary disease) ([place]) 09/25/2018  . Chronic bilateral low back pain without sciatica 07/11/2018  . Screening for breast cancer 07/11/2018  . 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 Pain due to knee joint prosthesis ([place]), right 02/16/2017  . Hx of aplastic anemia 10/31/2012  . HTN (hypertension) 06/19/2012  . Tobacco abuse 05/18/2012    Palliative Care Assessment & Plan   Patient Profile: 55 y.o. female  with past medical history of EtOH abuse, COPD, AML (diagnosed 11/2020) presented to [facility] ED from home with complaints of chest pain.  Work-up in ED showed hemoglobin 2.0.  Medical Oncology was consulted -patient had been seen by their service during previous hospitalization in December 2021.   Unfortunately, patient did not keep her follow-up appointments with them.  Wendy Edwards is not a candidate for induction chemotherapy.  They gave recommendation for supportive transfusions and feel her overall prognosis is poor with life expectancy estimated to  be days to weeks -recommendation given for hospice care at discharge. Patient was admitted on 04/30/2021 with severe anemia and thrombocytopenia with leukocytosis secondary to AML.   ED Course:Upon admission into the emergency department patient was seen to be afebrile, pulse 10 1-1 17, blood pressures 86/75-1 12/61, and O2 saturations currently maintained on 2.5L of nasal cannula oxygen.Labs significant for WBC 26.3, hemoglobin 2, platelets 18, sodium 132, potassium 2.4, chloride 96 BUN 8, creatinine 1.01, calcium 5.7,albumin 2.3,AST 48, andALT 16. Fecal occult stool was negative. Peripheral smear was obtained and pending. Showed mild cardiomegaly without any acute abnormality. Patient was ordered 1 L normal saline IV fluids due to low blood pressures, calcium gluconate 1 g IV, potassium chloride 30 mEq IV, 2 units of packed red blood cells, 1 unit of fresh frozen plasma.  Assessment: Acute myeloid leukemia Thrombocytopenia Transaminitis Anemia Chronic severe progressive anemia Leukocytosis Terminal care  Recommendations/Plan:  Continue full comfort measures  Continue DNR/DNI as previously documented - durable DNR form placed in shadow chart; copy was made and will be scanned into Vynca/ACP tab  Transfer to [facility] when bed available - eval pending  Patient opts to not pursue further blood transfusions or lab monitoring  Was able to speak with son today - he was unaware of patient's AML diagnosis and is understandably overwhelmed with news and prognosis information given to him today. He works 3rd shift - sleeps during the day and works at night  Ordered compazine 10mg  IV q6hrs for nausea/vomiting  PMT will continue to  follow and support holistically  Goals of Care and Additional Recommendations:  Limitations on Scope of Treatment: Full Comfort Care  Code Status:    Code Status Orders  (From admission, onward)         Start     Ordered   04/30/21 1934  Do not attempt resuscitation (DNR)  Continuous       Question Answer Comment  In the event of cardiac or respiratory ARREST Do not call a "code blue"   In the event of cardiac or respiratory ARREST Do not perform Intubation, CPR, defibrillation or ACLS   In the event of cardiac or respiratory ARREST Use medication by any route, position, wound care, and other measures to relive pain and suffering. May use oxygen, suction and manual treatment of airway obstruction as needed for comfort.      04/30/21 1944        Code Status History    Date Active Date Inactive Code Status Order ID Comments User Context   04/30/2021 0903 04/30/2021 1944 Full Code [id number]  Norval Morton, MD ED   11/30/2020 0830 12/02/2020 1654 Full Code [id number]  Dwyane Dee, MD Inpatient   06/25/2020 1621 06/30/2020 1945 Full Code [id number]  Lequita Halt, MD ED   09/25/2018 1428 09/29/2018 1728 Full Code [id number]  Karmen Bongo, MD ED   02/20/2017 1640 02/22/2017 1953 Full Code [id number]  Leighton Parody, PA-C Inpatient   06/15/2012 0438 06/15/2012 2257 Full Code [id number]  Teressa Lower, MD ED   Advance Care Planning Activity       Prognosis:   < 2 weeks  Discharge Planning:  Hospice facility  Care plan was discussed with primary RN, patient, patient's son, Dr. Cathie Beams, [facility] liaison   Thank you for allowing the Palliative Medicine Team to assist in the care of this patient.   Total Time 45 minutes Prolonged Time Billed  no       Greater than  50%  of this time was spent counseling and coordinating care related to the above assessment and plan.  Lin Landsman, NP  Please contact Palliative Medicine Team phone at [phone] for questions and concerns.    *Portions of this note are a verbal dictation therefore any spelling and/or grammatical errors are due to the "[place] One" system interpretation.

## 2021-05-01 NOTE — Progress Notes (Signed)
PROGRESS NOTE  Wendy Edwards  ZOX:[id number] DOB: 07-21-1966 DOA: 04/30/2021 PCP: Patient, No Pcp Per (Inactive)  Brief Narrative: Wendy Edwards is a 55 y.o. female with a history of aplastic anemia for 20 years, alcohol abuse and hepatic cirrhosis with persistent anemia and thrombocytopenia and recent diagnosis of AML (BM Bx Dec 2021) who did not keep oncology appointments, was said not to be a candidate for typical chemotherapy. She presented to the ED with chest pain found to have WBC 26.3k, hgb 2g/dl, platelets 18k, potassium 2.4, albumin 2.3. Oncology confirmed the patient not to be a candidate for chemotherapy, recommended hospice care. PRBCs and FFP given and patient was admitted with palliative care consultation. The patient has expressed the desire to proceed with comfort measures only and transfer to [facility].   Assessment & Plan: Principal Problem:   Acute myeloid leukemia ([place]) Active Problems:   Tobacco abuse   Thrombocytopenia ([facility])   Transaminitis   Anemia   Hypokalemia   Hypocalcemia  AML with chronic severe progressive anemia, thrombocytopenia, leukocytosis: No bleeding, negative FOBT.  - Hgb 2 > 9.4 s/p 4u PRBCs.  - No further lab checks or transfusion per [place] discussions.   Leukocytosis: Afebrile without nidus for infection identified. This is due to AML.   Chest pain with demand myocardial ischemia/troponin elevation: Improved with transfusions.  - Will forgo echocardiogram or further workup based on [place] discussions, though high output CHF is very likely with progressive profound anemia and cardiomegaly on CXR.   Alcohol abuse: No evidence of withdrawal at this time.   Tobacco use:  - Nicotine patch.   Prolonged QT interval: No longer checking.   Hypokalemia, hypocalcemia: No longer checking.   GERD: Can continue treating.  DVT prophylaxis: None Code Status: DNR Family Communication: Unable to reach son / only contact by phone Disposition Plan:   Status is: Inpatient  Remains inpatient appropriate because:Unsafe d/c plan   Dispo: The patient is from: Home              Anticipated d/c is to: Residential hospice              Patient currently is medically stable to d/c.   Difficult to place patient No  Consultants:   Palliative  Oncology  Procedures:   Transfusions  Antimicrobials:  None   Subjective: No pain or shortness of breath. Wants to go to hospice.  Objective: Vitals:   04/30/21 1607 04/30/21 1625 04/30/21 1915 05/01/21 0828  BP: 130/79 124/75 131/84 116/79  Pulse: (!) 109 (!) 110 (!) 110 (!) 109  Resp: 18 17 16 20   Temp: 98.3 F (36.8 C) 98.3 F (36.8 C) 98.4 F (36.9 C) 99.2 F (37.3 C)  TempSrc: Oral Oral Oral   SpO2: 100% 97% 97% 97%    Intake/Output Summary (Last 24 hours) at 05/01/2021 1534 Last data filed at 05/01/2021 1300 Gross per 24 hour  Intake 2423.33 ml  Output --  Net 2423.33 ml   Gen: Chronically ill-appearing, frail, unkempt female in no distress Pulm: Non-labored breathing. Clear to auscultation bilaterally.  CV: Regular tachycardia Flow murmur noted without rub, or gallop. No JVD, no pedal edema. GI: Abdomen soft, non-tender, non-distended, with normoactive bowel sounds. No organomegaly or masses felt. Ext: Warm, no deformities Skin: No rashes, lesions or ulcers on visualized skin Neuro: Alert incompletely oriented this early morning, moving all extremities.  Data Reviewed: I have personally reviewed following labs and imaging studies  CBC: Recent Labs  Lab  04/30/21 0426 04/30/21 1050 04/30/21 2034 05/01/21 0131  WBC 26.3* 21.5*  --  28.2*  NEUTROABS 4.7  --   --   --   HGB 2.0* 4.7* 9.2* 9.4*  HCT 6.6* 14.3* 27.0* 27.6*  MCV 124.5* 102.9*  --  85.4  PLT 18* 20*  --  23*   Basic Metabolic Panel: Recent Labs  Lab 04/30/21 0227 04/30/21 0426 04/30/21 0903 05/01/21 0131  NA 132* 132*  --  139  K 2.4* 2.4*  --  2.8*  CL 95* 96*  --  103  CO2 24 23  --  25   GLUCOSE 96 93  --  123*  BUN 8 8  --  9  CREATININE 0.97 1.01*  --  0.94  CALCIUM 5.6* 5.7*  --  8.0*  MG  --   --  0.8*  --   PHOS  --   --  3.7  --    GFR: CrCl cannot be calculated (Unknown ideal weight.). Liver Function Tests: Recent Labs  Lab 04/30/21 0227 04/30/21 0426 05/01/21 0131  AST 37 48* 60*  ALT 14 16 25   ALKPHOS 50 47 78  BILITOT 1.3* 0.8 1.4*  PROT 5.1* 4.9* 5.5*  ALBUMIN 2.4* 2.3* 2.5*   No results for input(s): LIPASE, AMYLASE in the last 168 hours. No results for input(s): AMMONIA in the last 168 hours. Coagulation Profile: No results for input(s): INR, PROTIME in the last 168 hours. Cardiac Enzymes: No results for input(s): CKTOTAL, CKMB, CKMBINDEX, TROPONINI in the last 168 hours. BNP (last 3 results) No results for input(s): PROBNP in the last 8760 hours. HbA1C: No results for input(s): HGBA1C in the last 72 hours. CBG: No results for input(s): GLUCAP in the last 168 hours. Lipid Profile: No results for input(s): CHOL, HDL, LDLCALC, TRIG, CHOLHDL, LDLDIRECT in the last 72 hours. Thyroid Function Tests: No results for input(s): TSH, T4TOTAL, FREET4, T3FREE, THYROIDAB in the last 72 hours. Anemia Panel: No results for input(s): VITAMINB12, FOLATE, FERRITIN, TIBC, IRON, RETICCTPCT in the last 72 hours. Urine analysis:    Component Value Date/Time   COLORURINE YELLOW 01/13/2019 1822   APPEARANCEUR CLEAR 01/13/2019 1822   LABSPEC 1.020 01/13/2019 1822   PHURINE 6.0 01/13/2019 1822   GLUCOSEU NEGATIVE 01/13/2019 1822   HGBUR NEGATIVE 01/13/2019 1822   BILIRUBINUR NEGATIVE 01/13/2019 1822   KETONESUR 80 (A) 01/13/2019 1822   PROTEINUR 30 (A) 01/13/2019 1822   UROBILINOGEN 0.2 11/11/2010 0327   NITRITE POSITIVE (A) 01/13/2019 1822   LEUKOCYTESUR NEGATIVE 01/13/2019 1822   Recent Results (from the past 240 hour(s))  SARS CORONAVIRUS 2 (TAT 6-24 HRS) Nasopharyngeal Nasopharyngeal Swab     Status: None   Collection Time: 04/30/21  5:47 AM    Specimen: Nasopharyngeal Swab  Result Value Ref Range Status   SARS Coronavirus 2 NEGATIVE NEGATIVE Final    Comment: (NOTE) SARS-CoV-2 target nucleic acids are NOT DETECTED.  The SARS-CoV-2 RNA is generally detectable in upper and lower respiratory specimens during the acute phase of infection. Negative results do not preclude SARS-CoV-2 infection, do not rule out co-infections with other pathogens, and should not be used as the sole basis for treatment or other patient management decisions. Negative results must be combined with clinical observations, patient history, and epidemiological information. The expected result is Negative.  Fact Sheet for Patients: SugarRoll.be  Fact Sheet for Healthcare Providers: https://www.woods-mathews.com/  This test is not yet approved or cleared by the [place] FDA and  has been  authorized for detection and/or diagnosis of SARS-CoV-2 by FDA under an Emergency Use Authorization (EUA). This EUA will remain  in effect (meaning this test can be used) for the duration of the COVID-19 declaration under Se ction 564(b)(1) of the Act, 21 U.S.C. section 360bbb-3(b)(1), unless the authorization is terminated or revoked sooner.  Performed at [facility], [place] [address], [place], [place] [address]       Radiology Studies: DG Chest 2 View  Result Date: 04/30/2021 CLINICAL DATA:  55 year old female with chest pain. EXAM: CHEST - 2 VIEW COMPARISON:  Chest radiograph dated 11/29/2020. FINDINGS: No focal consolidation, pleural effusion, or pneumothorax. Mild cardiomegaly. No acute osseous pathology. IMPRESSION: No active cardiopulmonary disease. Electronically Signed   By: Anner Crete M.D.   On: 04/30/2021 01:31    Scheduled Meds: . nicotine  21 mg Transdermal Daily  . sodium chloride flush  3 mL Intravenous Q12H  . sucralfate  1 g Oral TID WC & HS   Continuous Infusions:   LOS: 1 day   Time  spent: 25 minutes.  Patrecia Pour, MD [facility] [email or web address] 05/01/2021, 3:34 PM

## 2021-05-01 NOTE — Progress Notes (Signed)

## 2021-05-01 NOTE — Progress Notes (Signed)
Pt has been having episodes of nausea. Was given Zofran which was mildly effective. Pt then started having episodes of nausea and vomiting again. Pt was given Ativan 2mg . At this time pt is resting with eyes closed. Easily arousable. Will continue to monitor and tx as indicated.

## 2021-05-02 DIAGNOSIS — C9200 Acute myeloblastic leukemia, not having achieved remission: Secondary | ICD-10-CM | POA: Diagnosis not present

## 2021-05-02 DIAGNOSIS — L8990 Pressure ulcer of unspecified site, unspecified stage: Secondary | ICD-10-CM | POA: Insufficient documentation

## 2021-05-02 DIAGNOSIS — D649 Anemia, unspecified: Secondary | ICD-10-CM | POA: Diagnosis not present

## 2021-05-02 DIAGNOSIS — D696 Thrombocytopenia, unspecified: Secondary | ICD-10-CM | POA: Diagnosis not present

## 2021-05-02 DIAGNOSIS — R7401 Elevation of levels of liver transaminase levels: Secondary | ICD-10-CM | POA: Diagnosis not present

## 2021-05-02 DIAGNOSIS — E876 Hypokalemia: Secondary | ICD-10-CM | POA: Diagnosis not present

## 2021-05-02 MED ORDER — MORPHINE SULFATE (CONCENTRATE) 10 MG/0.5ML PO SOLN: 5.0000 mg | Freq: Four times a day (QID) | ORAL | Status: DC

## 2021-05-02 MED ORDER — MORPHINE SULFATE (CONCENTRATE) 10 MG/0.5ML PO SOLN
5.0000 mg | Freq: Four times a day (QID) | ORAL | Status: DC
  Administered 2021-05-02 – 2021-05-03 (×5): 5 mg via SUBLINGUAL
  Filled 2021-05-02 (×5): qty 0.5

## 2021-05-02 MED ORDER — PROCHLORPERAZINE EDISYLATE 10 MG/2ML IJ SOLN
10.0000 mg | Freq: Four times a day (QID) | INTRAMUSCULAR | Status: DC
  Administered 2021-05-02 – 2021-05-03 (×5): 10 mg via INTRAVENOUS
  Filled 2021-05-02 (×5): qty 2

## 2021-05-02 NOTE — Progress Notes (Signed)
PROGRESS NOTE  Wendy Edwards  DUK:[id number] DOB: [birth year] DOA: 04/30/2021 PCP: Patient, No Pcp Per (Inactive)  Brief Narrative: Wendy Edwards is a 55 y.o. female with a history of aplastic anemia for 20 years, alcohol abuse and hepatic cirrhosis with persistent anemia and thrombocytopenia and recent diagnosis of AML (BM Bx Dec 2021) who did not keep oncology appointments, was said not to be a candidate for typical chemotherapy. She presented to the ED with chest pain found to have WBC 26.3k, hgb 2g/dl, platelets 18k, potassium 2.4, albumin 2.3. Oncology confirmed the patient not to be a candidate for chemotherapy, recommended hospice care. PRBCs and FFP given and patient was admitted with palliative care consultation. The patient has expressed the desire to proceed with comfort measures only and transfer to [facility].   Assessment & Plan: Principal Problem:   Acute myeloid leukemia ([place]) Active Problems:   Tobacco abuse   Thrombocytopenia (HCC)   Transaminitis   Anemia   Hypokalemia   Hypocalcemia   Pressure injury of skin  AML with chronic severe progressive anemia, thrombocytopenia, leukocytosis: No bleeding, negative FOBT.  - Hgb 2 > 9.4 s/p 4u PRBCs.  - No further lab checks or transfusion per [place] discussions.   Leukocytosis: Afebrile without nidus for infection identified. This is due to AML.   Chest pain with demand myocardial ischemia/troponin elevation: Improved with transfusions.  - Will forgo echocardiogram or further workup based on [place] discussions, though high output CHF is very likely with progressive profound anemia and cardiomegaly on CXR.   Alcohol abuse: No evidence of withdrawal at this time.   Tobacco use:  - Nicotine patch.   Prolonged QT interval: No longer checking.   Hypokalemia, hypocalcemia: No longer checking.   GERD: Can continue treating.  DVT prophylaxis: None Code Status: DNR Family Communication: Called son without answer this  afternoon. Disposition Plan:  Status is: Inpatient  Remains inpatient appropriate because:Unsafe d/c plan - awaiting bed availability at hospice.  Dispo: The patient is from: Home              Anticipated d/c is to: Residential hospice              Patient currently is medically stable to d/c.   Difficult to place patient No  Consultants:   Palliative  Oncology  Procedures:   Transfusions  Antimicrobials:  None   Subjective: Sleeping this morning, disoriented, denies pain. No other complaints. Denies feeling fever this morning.  Objective: Vitals:   04/30/21 1915 05/01/21 0828 05/02/21 0528 05/02/21 0637  BP: 131/84 116/79 (!) 100/58   Pulse: (!) 110 (!) 109 (!) 116   Resp: 16 20 20    Temp: 98.4 F (36.9 C) 99.2 F (37.3 C) (!) 101.7 F (38.7 C) 100 F (37.8 C)  TempSrc: Oral  Oral Oral  SpO2: 97% 97% 98%    Gen: Frail, ill-appearing female in no distress Pulm: Nonlabored breathing room air. Clear. CV: Regular rate and rhythm. No murmur, rub, or gallop. No JVD, no pitting dependent edema. GI: Abdomen soft, non-tender, non-distended, with normoactive bowel sounds.  Ext: Warm, decreased muscle bulk Skin: No new rashes, lesions or ulcers on visualized skin. Scattered tattoos. Neuro: Drowsy, rousable, incompletely oriented without definite focal neurological deficits. Psych: Judgement and insight appear marginal. Calm.  Data Reviewed: I have personally reviewed following labs and imaging studies  CBC: Recent Labs  Lab 04/30/21 0426 04/30/21 1050 04/30/21 2034 05/01/21 0131  WBC 26.3* 21.5*  --  28.2*  NEUTROABS 4.7  --   --   --   HGB 2.0* 4.7* 9.2* 9.4*  HCT 6.6* 14.3* 27.0* 27.6*  MCV 124.5* 102.9*  --  85.4  PLT 18* 20*  --  23*   Basic Metabolic Panel: Recent Labs  Lab 04/30/21 0227 04/30/21 0426 04/30/21 0903 05/01/21 0131  NA 132* 132*  --  139  K 2.4* 2.4*  --  2.8*  CL 95* 96*  --  103  CO2 24 23  --  25  GLUCOSE 96 93  --  123*  BUN  8 8  --  9  CREATININE 0.97 1.01*  --  0.94  CALCIUM 5.6* 5.7*  --  8.0*  MG  --   --  0.8*  --   PHOS  --   --  3.7  --    GFR: CrCl cannot be calculated (Unknown ideal weight.). Liver Function Tests: Recent Labs  Lab 04/30/21 0227 04/30/21 0426 05/01/21 0131  AST 37 48* 60*  ALT 14 16 25   ALKPHOS 50 47 78  BILITOT 1.3* 0.8 1.4*  PROT 5.1* 4.9* 5.5*  ALBUMIN 2.4* 2.3* 2.5*   No results for input(s): LIPASE, AMYLASE in the last 168 hours. No results for input(s): AMMONIA in the last 168 hours. Coagulation Profile: No results for input(s): INR, PROTIME in the last 168 hours. Cardiac Enzymes: No results for input(s): CKTOTAL, CKMB, CKMBINDEX, TROPONINI in the last 168 hours. BNP (last 3 results) No results for input(s): PROBNP in the last 8760 hours. HbA1C: No results for input(s): HGBA1C in the last 72 hours. CBG: No results for input(s): GLUCAP in the last 168 hours. Lipid Profile: No results for input(s): CHOL, HDL, LDLCALC, TRIG, CHOLHDL, LDLDIRECT in the last 72 hours. Thyroid Function Tests: No results for input(s): TSH, T4TOTAL, FREET4, T3FREE, THYROIDAB in the last 72 hours. Anemia Panel: No results for input(s): VITAMINB12, FOLATE, FERRITIN, TIBC, IRON, RETICCTPCT in the last 72 hours. Urine analysis:    Component Value Date/Time   COLORURINE YELLOW 01/13/2019 1822   APPEARANCEUR CLEAR 01/13/2019 1822   LABSPEC 1.020 01/13/2019 1822   PHURINE 6.0 01/13/2019 1822   GLUCOSEU NEGATIVE 01/13/2019 1822   HGBUR NEGATIVE 01/13/2019 1822   BILIRUBINUR NEGATIVE 01/13/2019 1822   KETONESUR 80 (A) 01/13/2019 1822   PROTEINUR 30 (A) 01/13/2019 1822   UROBILINOGEN 0.2 11/11/2010 0327   NITRITE POSITIVE (A) 01/13/2019 1822   LEUKOCYTESUR NEGATIVE 01/13/2019 1822   Recent Results (from the past 240 hour(s))  SARS CORONAVIRUS 2 (TAT 6-24 HRS) Nasopharyngeal Nasopharyngeal Swab     Status: None   Collection Time: 04/30/21  5:47 AM   Specimen: Nasopharyngeal Swab   Result Value Ref Range Status   SARS Coronavirus 2 NEGATIVE NEGATIVE Final    Comment: (NOTE) SARS-CoV-2 target nucleic acids are NOT DETECTED.  The SARS-CoV-2 RNA is generally detectable in upper and lower respiratory specimens during the acute phase of infection. Negative results do not preclude SARS-CoV-2 infection, do not rule out co-infections with other pathogens, and should not be used as the sole basis for treatment or other patient management decisions. Negative results must be combined with clinical observations, patient history, and epidemiological information. The expected result is Negative.  Fact Sheet for Patients: SugarRoll.be  Fact Sheet for Healthcare Providers: https://www.woods-mathews.com/  This test is not yet approved or cleared by the [place] FDA and  has been authorized for detection and/or diagnosis of SARS-CoV-2 by FDA under an Emergency Use Authorization (EUA). This EUA  will remain  in effect (meaning this test can be used) for the duration of the COVID-19 declaration under Se ction 564(b)(1) of the Act, 21 U.S.C. section 360bbb-3(b)(1), unless the authorization is terminated or revoked sooner.  Performed at [facility], [place] [address] [place], [place] [address]       Radiology Studies: No results found.  Scheduled Meds: . morphine CONCENTRATE  5 mg Sublingual Q6H   And  . prochlorperazine  10 mg Intravenous Q6H  . nicotine  21 mg Transdermal Daily  . sodium chloride flush  3 mL Intravenous Q12H  . sucralfate  1 g Oral TID WC & HS   Continuous Infusions:   LOS: 2 days   Time spent: 25 minutes.  Patrecia Pour, MD [facility] [email or web address] 05/02/2021, 1:55 PM

## 2021-05-02 NOTE — Progress Notes (Signed)
Daily Progress Note   Patient Name: Wendy Edwards       Date: 05/02/2021 DOB: 04-09-1966  Age: 55 y.o. MRN#: [id number] Attending Physician: Patrecia Pour, MD Primary Care Physician: Patient, No Pcp Per (Inactive) Admit Date: 04/30/2021  Reason for Consultation/Follow-up: end of life care, symptom management  Subjective: Patient is sleeping when I enter the room but awakens easily to voice. She reports abdominal pain rated at 7/10. Per MAR, she received 1 dose of morphine overnight; none given so far today. I offer to schedule the pain medication so she doesn't have to ask for it - patient is agreeable.   Note breakfast tray is still in the room untouched. Lunch tray is on the bedside table - patient reports she did not know it was there.   Discussed plan to transfer to [facility]. Patient inquires what type of care she will receive there. I provided education on hospice philosophy, reviewing that [place] is for patients who are at end of life. Discussed that she will receive 24/7 nursing care and symptom management. Patient verbalizes understanding.    13:45--Attempted to call son/[name], no answer - confidential voicemail left.   Length of Stay: 2  Current Medications: Scheduled Meds:  . nicotine  21 mg Transdermal Daily  . sodium chloride flush  3 mL Intravenous Q12H  . sucralfate  1 g Oral TID WC & HS      PRN Meds: acetaminophen **OR** acetaminophen, albuterol, antiseptic oral rinse, glycopyrrolate **OR** glycopyrrolate **OR** glycopyrrolate, haloperidol **OR** haloperidol **OR** haloperidol lactate, LORazepam **OR** LORazepam **OR** LORazepam, LORazepam **OR** LORazepam, morphine injection, ondansetron **OR** ondansetron (ZOFRAN) IV, polyvinyl alcohol,  prochlorperazine  Physical Exam Vitals reviewed.  Constitutional:      General: She is not in acute distress.    Appearance: She is ill-appearing.  Pulmonary:     Effort: Pulmonary effort is normal.  Neurological:     Mental Status: She is lethargic.     Motor: Weakness present.             Vital Signs: BP (!) 100/58 (BP Location: Right Arm)   Pulse (!) 116   Temp 100 F (37.8 C) (Oral)   Resp 20   SpO2 98%  SpO2: SpO2: 98 % O2 Device: O2 Device: Room Air O2 Flow Rate: O2 Flow Rate (  L/min): 0 L/min  Intake/output summary: No intake or output data in the 24 hours ending 05/02/21 1313 LBM: Last BM Date:  (PTA) Baseline Weight:   Most recent weight:         Palliative Assessment/Data: PPS 20%     Patient Active Problem List   Diagnosis Date Noted  . Pressure injury of skin 05/02/2021  . Anemia 04/30/2021  . Acute myeloid leukemia ([place]) 04/30/2021  . Hypokalemia 04/30/2021  . Hypocalcemia 04/30/2021  . Hand pain, left 11/30/2020  . Macrocytic anemia 11/29/2020  . Transaminitis 11/29/2020  . Goals of care, counseling/discussion 07/05/2020  . Cirrhosis of liver without ascites ([place])   . Pancytopenia ([place]) 06/26/2020  . Anxiety 06/26/2020  . GERD (gastroesophageal reflux disease) 06/26/2020  . Vitamin B12 deficiency 06/26/2020  . Pernicious anemia   . Thrombocytopenia ([place]) 06/25/2020  . Fall   . Aplastic anemia ([place])   . Altered mental status 09/25/2018  . Metabolic acidosis 00/93/8182  . Malnutrition ([place]) 09/25/2018  . ETOH abuse 09/25/2018  . Sepsis ([place]) 09/25/2018  . COPD (chronic obstructive pulmonary disease) ([place]) 09/25/2018  . Chronic bilateral low back pain without sciatica 07/11/2018  . Screening for breast cancer 07/11/2018  . 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 Pain due to knee joint prosthesis ([place]), right 02/16/2017  . Hx of aplastic anemia 10/31/2012  . HTN (hypertension) 06/19/2012  . Tobacco abuse 05/18/2012    Palliative  Care Assessment & Plan   Patient Profile: 55 y.o. female  with past medical history of EtOH abuse, COPD, AML (diagnosed 11/2020) presented to [facility] ED from home with complaints of chest pain.  Work-up in ED showed hemoglobin 2.0.  Medical Oncology was consulted -patient had been seen by their service during previous hospitalization in December 2021.  Unfortunately, patient did not keep her follow-up appointments with them.  She is not a candidate for induction chemotherapy.  They gave recommendation for supportive transfusions and feel her overall prognosis is poor with life expectancy estimated to be days to weeks -recommendation given for hospice care at discharge. Patient was admitted on 04/30/2021 with severe anemia and thrombocytopenia with leukocytosis secondary to AML.    ED Course: Upon admission into the emergency department patient was seen to be afebrile, pulse 10 1-1 17, blood pressures 86/75-1 12/61, and O2 saturations currently maintained on 2.5  L of nasal cannula oxygen.  Labs significant for WBC 26.3, hemoglobin 2, platelets 18, sodium 132, potassium 2.4, chloride 96 BUN 8, creatinine 1.01, calcium 5.7, albumin 2.3, AST 48, and ALT 16.  Fecal occult stool was negative.  Peripheral smear was obtained and pending.  Showed mild cardiomegaly without any acute abnormality.  Patient was ordered 1 L normal saline IV fluids due to low blood pressures, calcium gluconate 1 g IV, potassium chloride 30 mEq IV, 2 units of packed red blood cells, 1 unit of fresh frozen plasma.   Assessment: Acute myeloid leukemia Thrombocytopenia Transaminitis Anemia Chronic severe progressive anemia Leukocytosis Terminal care  Recommendations/Plan:  Continue full comfort measures  DNR/DNI as previously documented  Start scheduled morphine concentrate solution 5 mg every 6 hours  Continue morphine 2 mg IV every 2 hours as needed for breakthrough pain  Continue compazine 10 mg IV every 6 hours  Patient has  decided not to pursue further blood transfusions  Transfer to [facility] when bed is available - no bed available today  PMT will continue to follow  Goals of Care and Additional Recommendations:  Limitations on Scope of Treatment: Full Comfort Care  Code Status:  DNR/DNI  Prognosis:   < 2 weeks  Discharge Planning:  Hospice facility   Thank you for allowing the Palliative Medicine Team to assist in the care of this patient.   Total Time 26 minutes Prolonged Time Billed  no       Greater than 50%  of this time was spent counseling and coordinating care related to the above assessment and plan.  Lavena Bullion, NP  Please contact Palliative Medicine Team phone at [phone] for questions and concerns.

## 2021-05-02 NOTE — Plan of Care (Signed)
  Problem: Education: Goal: Knowledge of General Education information will improve Description: Including pain rating scale, medication(s)/side effects and non-pharmacologic comfort measures Outcome: Progressing   Problem: Health Behavior/Discharge Planning: Goal: Ability to manage health-related needs will improve Outcome: Progressing   Problem: Clinical Measurements: Goal: Ability to maintain clinical measurements within normal limits will improve Outcome: Progressing Goal: Respiratory complications will improve Outcome: Progressing   Problem: Nutrition: Goal: Adequate nutrition will be maintained Outcome: Progressing   Problem: Elimination: Goal: Will not experience complications related to urinary retention Outcome: Progressing   Problem: Pain Managment: Goal: General experience of comfort will improve Outcome: Progressing   Problem: Safety: Goal: Ability to remain free from injury will improve Outcome: Progressing   Problem: Skin Integrity: Goal: Risk for impaired skin integrity will decrease Outcome: Progressing   

## 2021-05-02 NOTE — Progress Notes (Signed)
Manufacturing engineer [facility])   Wendy Edwards was referred to residential hospice at [facility].    Currently, we do not have a bed to offer.  Venia Carbon RN, BSN, [place] [facility] Liaison

## 2021-05-02 NOTE — Progress Notes (Signed)
Chaplain responded to [facility] for EOL, and request for prayer.  Pt appeared exhausted and repeatedly gagged from nausea.  It was difficult for her to maintain attention because she was so weak and nauseous.  When asked about needs, pt requested prayer, so chaplain prayed with pt.  Chaplain offered continued support as needed.    Thank you for this consult.  Please contact our office as needed.  Luana Shu [phone]     05/02/21 1400  Clinical Encounter Type  Visited With Patient  Visit Type Initial;Spiritual support  Referral From Patient  Consult/Referral To Chaplain  Spiritual Encounters  Spiritual Needs Prayer  Stress Factors  Patient Stress Factors Loss of control

## 2021-05-02 NOTE — Progress Notes (Signed)
Pt transferred to 6N rm 24. Report called to receiving nurse. All pts belongings packed and sent with pt. Pt was alert and responsive when delivered to 6N.

## 2021-05-03 DIAGNOSIS — C9200 Acute myeloblastic leukemia, not having achieved remission: Secondary | ICD-10-CM | POA: Diagnosis not present

## 2021-05-03 DIAGNOSIS — D649 Anemia, unspecified: Secondary | ICD-10-CM | POA: Diagnosis not present

## 2021-05-03 DIAGNOSIS — Z515 Encounter for palliative care: Secondary | ICD-10-CM | POA: Diagnosis not present

## 2021-05-03 DIAGNOSIS — R7401 Elevation of levels of liver transaminase levels: Secondary | ICD-10-CM | POA: Diagnosis not present

## 2021-05-03 DIAGNOSIS — E876 Hypokalemia: Secondary | ICD-10-CM | POA: Diagnosis not present

## 2021-05-03 LAB — CBC WITH DIFFERENTIAL/PLATELET
Abs Immature Granulocytes: 0 10*3/uL (ref 0.00–0.07)
Basophils Absolute: 1.8 10*3/uL — ABNORMAL HIGH (ref 0.0–0.1)
Basophils Relative: 7 %
Eosinophils Absolute: 0.5 10*3/uL (ref 0.0–0.5)
Eosinophils Relative: 2 %
HCT: 6.6 % — ABNORMAL LOW (ref 36.0–46.0)
Hemoglobin: 2 g/dL — CL (ref 12.0–15.0)
Lymphocytes Relative: 43 %
Lymphs Abs: 11.3 10*3/uL — ABNORMAL HIGH (ref 0.7–4.0)
MCH: 37.7 pg — ABNORMAL HIGH (ref 26.0–34.0)
MCHC: 30.3 g/dL (ref 30.0–36.0)
MCV: 124.5 fL — ABNORMAL HIGH (ref 80.0–100.0)
Monocytes Absolute: 2.6 10*3/uL — ABNORMAL HIGH (ref 0.1–1.0)
Monocytes Relative: 10 %
Neutro Abs: 4.7 10*3/uL (ref 1.7–7.7)
Neutrophils Relative %: 18 %
Other: 20 %
Platelets: 18 10*3/uL — CL (ref 150–400)
RBC: <1 MIL/uL — ABNORMAL LOW (ref 3.87–5.11)
RDW: 16.4 % — ABNORMAL HIGH (ref 11.5–15.5)
WBC: 26.3 10*3/uL — ABNORMAL HIGH (ref 4.0–10.5)
nRBC: 1.5 % — ABNORMAL HIGH (ref 0.0–0.2)
nRBC: 4 /100 WBC — ABNORMAL HIGH

## 2021-05-03 MED ORDER — PROCHLORPERAZINE EDISYLATE 10 MG/2ML IJ SOLN
10.0000 mg | Freq: Four times a day (QID) | INTRAMUSCULAR | Status: DC
  Administered 2021-05-03 – 2021-05-04 (×3): 10 mg via INTRAVENOUS
  Filled 2021-05-03 (×3): qty 2

## 2021-05-03 MED ORDER — MORPHINE SULFATE (CONCENTRATE) 10 MG/0.5ML PO SOLN
5.0000 mg | ORAL | Status: DC
  Administered 2021-05-03 – 2021-05-04 (×4): 5 mg via SUBLINGUAL
  Filled 2021-05-03 (×4): qty 0.5

## 2021-05-03 NOTE — Progress Notes (Addendum)
[id number]) Hospital Liaison RN note.  Eligibility confirmed. Unfortunately, [facility] is not able to offer a room today.   Pt and TOC, Magdalen Spatz, RN aware Hospital Liaison will follow up tomorrow or sooner if room available.  Pt's son, [name] also notified.  Please call with any Hospice related questions or concerns.  Thank you,  Bobbie "Loren Racer, [facility] Liaison [phone]

## 2021-05-03 NOTE — Progress Notes (Signed)
CSW spoke with Wendy Edwards of [facility] who states there are no beds available for the patient today.  Madilyn Fireman, MSW, LCSW Transitions of Care  Clinical Social Worker II [phone]

## 2021-05-03 NOTE — Progress Notes (Signed)
Daily Progress Note   Patient Name: Wendy Edwards       Date: 05/03/2021 DOB: 06/18/1966  Age: 55 y.o. MRN#: [id number] Attending Physician: Patrecia Pour, MD Primary Care Physician: Patient, No Pcp Per (Inactive) Admit Date: 04/30/2021  Reason for Consultation/Follow-up: symptom management, end of life care  Subjective: Patient is sleeping when I enter the room, but awakens easily to voice. She continues to report abdominal pain and chest pain, rated 6/10. She does however report her pain is improved from yesterday. I offer to increase the scheduled morphine to every 4 hours - she agrees. I also remind her that she can have IV pain medication as needed for breakthrough pain.  Length of Stay: 3  Current Medications: Scheduled Meds:  . morphine CONCENTRATE  5 mg Sublingual Q6H   And  . prochlorperazine  10 mg Intravenous Q6H  . nicotine  21 mg Transdermal Daily  . sodium chloride flush  3 mL Intravenous Q12H  . sucralfate  1 g Oral TID WC & HS     PRN Meds: acetaminophen **OR** acetaminophen, albuterol, antiseptic oral rinse, glycopyrrolate **OR** glycopyrrolate **OR** glycopyrrolate, haloperidol **OR** haloperidol **OR** haloperidol lactate, LORazepam **OR** LORazepam **OR** LORazepam, morphine injection, ondansetron **OR** ondansetron (ZOFRAN) IV, polyvinyl alcohol, prochlorperazine  Physical Exam Vitals reviewed.  Constitutional:      General: She is not in acute distress.    Appearance: She is ill-appearing.  Pulmonary:     Effort: Pulmonary effort is normal.  Neurological:     Mental Status: She is lethargic.             Vital Signs: BP 117/71 (BP Location: Left Arm)   Pulse (!) 117   Temp 98.1 F (36.7 C)   Resp 20   SpO2 95%  SpO2: SpO2: 95 % O2 Device: O2 Device:  Room Air O2 Flow Rate: O2 Flow Rate (L/min): 0 L/min  Intake/output summary:   Intake/Output Summary (Last 24 hours) at 05/03/2021 1634 Last data filed at 05/03/2021 0400 Gross per 24 hour  Intake 60 ml  Output --  Net 60 ml   LBM: Last BM Date:  (PTA) Baseline Weight:   Most recent weight:         Palliative Assessment/Data: PPS 20%      Palliative Care Assessment & Plan  ED Course:Upon admission into the emergency department patient was seen to be afebrile, pulse 10 1-1 17, blood pressures 86/75-1 12/61, and O2 saturations currently maintained on 2.5L of nasal cannula oxygen.Labs significant for WBC 26.3, hemoglobin 2, platelets 18, sodium 132, potassium 2.4, chloride 96 BUN 8, creatinine 1.01, calcium 5.7,albumin 2.3,AST 48, andALT 16. Fecal occult stool was negative. Peripheral smear was obtained and pending. Showed mild cardiomegaly without any acute abnormality. Patient was ordered 1 L normal saline IV fluids due to low blood pressures, calcium gluconate 1 g IV, potassium chloride 30 mEq IV, 2 units of packed red blood cells, 1 unit of fresh frozen plasma.  Assessment: Acute myeloid leukemia Thrombocytopenia Transaminitis Anemia Chronic severe progressive anemia Leukocytosis Terminal care  Recommendations/Plan:  Continue full comfort measures  DNR/DNI as previously documented  Increase scheduled morphine concentrate solution to every 4 hours  Continue morphine 2 mg IV every 2 hours as needed for breakthrough pain  Continue compazine 10 mg IV every 6 hours  Patient has decided not to pursue further blood transfusions  Transfer to [facility] when bed is available - no bed available today  PMT will continue to follow   Goals of Care and Additional Recommendations:  Limitations on Scope of Treatment: Full Comfort Care  Code Status:  DNR/DNI  Prognosis:   < 2 weeks  Discharge Planning:  Hospice facility   Thank you for allowing  the Palliative Medicine Team to assist in the care of this patient.   Total Time 15 minutes Prolonged Time Billed  no       Greater than 50%  of this time was spent counseling and coordinating care related to the above assessment and plan.  Lavena Bullion, NP  Please contact Palliative Medicine Team phone at [phone] for questions and concerns.

## 2021-05-03 NOTE — Care Management Important Message (Signed)
Important Message  Patient Details  Name: Wendy Edwards MRN: [id number] Date of Birth: June 10, 1966   Medicare Important Message Given:  Yes     Orbie Pyo 05/03/2021, 3:15 PM

## 2021-05-03 NOTE — Progress Notes (Signed)
PROGRESS NOTE  Wendy Edwards  LOV:[id number] DOB: 29-Jan-1966 DOA: 04/30/2021 PCP: Patient, No Pcp Per (Inactive)  Brief Narrative: Wendy Edwards is a 55 y.o. female with a history of aplastic anemia for 20 years, alcohol abuse and hepatic cirrhosis with persistent anemia and thrombocytopenia and recent diagnosis of AML (BM Bx Dec 2021) who did not keep oncology appointments, was said not to be a candidate for typical chemotherapy. She presented to the ED with chest pain found to have WBC 26.3k, hgb 2g/dl, platelets 18k, potassium 2.4, albumin 2.3. Oncology confirmed the patient not to be a candidate for chemotherapy, recommended hospice care. PRBCs and FFP given and patient was admitted with palliative care consultation. The patient has expressed the desire to proceed with comfort measures only and transfer to [facility].   Assessment & Plan: Principal Problem:   Acute myeloid leukemia ([place]) Active Problems:   Tobacco abuse   Thrombocytopenia (HCC)   Transaminitis   Anemia   Hypokalemia   Hypocalcemia   Pressure injury of skin  AML with chronic severe progressive anemia, thrombocytopenia, leukocytosis: No bleeding, negative FOBT.  - Hgb 2 > 9.4 s/p 4u PRBCs.  - No further lab checks or transfusion per [place] discussions.   Leukocytosis: Afebrile without nidus for infection identified. This is due to AML. No further investigation planned.  Chest pain with demand myocardial ischemia/troponin elevation: Improved with transfusions.  - Will forgo echocardiogram or further workup based on [facility] discussions, though high output CHF is very likely with progressive profound anemia and cardiomegaly on CXR.   Alcohol abuse: No evidence of withdrawal at this time.   Tobacco use:  - Nicotine patch.   Prolonged QT interval: No longer checking.   Hypokalemia, hypocalcemia: No longer checking.   GERD: Can continue treating.  DVT prophylaxis: None Code Status: DNR Family Communication: None at  bedside. Disposition Plan:  Status is: Inpatient  Remains inpatient appropriate because:Unsafe d/c plan - awaiting bed availability at hospice. CSW reports no beds available today.  Dispo: The patient is from: Home              Anticipated d/c is to: Residential hospice              Patient currently is medically stable to d/c.   Difficult to place patient No  Consultants:   Palliative  Oncology  Procedures:   Transfusions  Antimicrobials:  None   Subjective: Sleeping soundly, reported nausea, not vomiting currently. No reports of pain.   Objective: Vitals:   05/01/21 0828 05/02/21 0528 05/02/21 0637 05/03/21 0337  BP: 116/79 (!) 100/58  117/71  Pulse: (!) 109 (!) 116  (!) 117  Resp: 20 20  20   Temp: 99.2 F (37.3 C) (!) 101.7 F (38.7 C) 100 F (37.8 C) 98.1 F (36.7 C)  TempSrc:  Oral Oral   SpO2: 97% 98%  95%   Gen: Frail female in no acute distress Pulm: Nonlabored without crackles CV: Regular tachycardia without edema or JVD. GI: Abdomen soft, non-tender, non-distended, with normoactive bowel sounds.  Ext: Warm, no deformities Skin: No new rashes, lesions or ulcers on visualized skin. Neuro: Rousable, incompletely oriented, moves all extremities. Psych: Judgement and insight appear fair. Calm.  Data Reviewed: I have personally reviewed following labs and imaging studies  CBC: Recent Labs  Lab 04/30/21 0426 04/30/21 1050 04/30/21 2034 05/01/21 0131  WBC 26.3* 21.5*  --  28.2*  NEUTROABS 4.7  --   --   --  HGB 2.0* 4.7* 9.2* 9.4*  HCT 6.6* 14.3* 27.0* 27.6*  MCV 124.5* 102.9*  --  85.4  PLT 18* 20*  --  23*   Basic Metabolic Panel: Recent Labs  Lab 04/30/21 0227 04/30/21 0426 04/30/21 0903 05/01/21 0131  NA 132* 132*  --  139  K 2.4* 2.4*  --  2.8*  CL 95* 96*  --  103  CO2 24 23  --  25  GLUCOSE 96 93  --  123*  BUN 8 8  --  9  CREATININE 0.97 1.01*  --  0.94  CALCIUM 5.6* 5.7*  --  8.0*  MG  --   --  0.8*  --   PHOS  --   --   3.7  --    GFR: CrCl cannot be calculated (Unknown ideal weight.). Liver Function Tests: Recent Labs  Lab 04/30/21 0227 04/30/21 0426 05/01/21 0131  AST 37 48* 60*  ALT 14 16 25   ALKPHOS 50 47 78  BILITOT 1.3* 0.8 1.4*  PROT 5.1* 4.9* 5.5*  ALBUMIN 2.4* 2.3* 2.5*   No results for input(s): LIPASE, AMYLASE in the last 168 hours. No results for input(s): AMMONIA in the last 168 hours. Coagulation Profile: No results for input(s): INR, PROTIME in the last 168 hours. Cardiac Enzymes: No results for input(s): CKTOTAL, CKMB, CKMBINDEX, TROPONINI in the last 168 hours. BNP (last 3 results) No results for input(s): PROBNP in the last 8760 hours. HbA1C: No results for input(s): HGBA1C in the last 72 hours. CBG: No results for input(s): GLUCAP in the last 168 hours. Lipid Profile: No results for input(s): CHOL, HDL, LDLCALC, TRIG, CHOLHDL, LDLDIRECT in the last 72 hours. Thyroid Function Tests: No results for input(s): TSH, T4TOTAL, FREET4, T3FREE, THYROIDAB in the last 72 hours. Anemia Panel: No results for input(s): VITAMINB12, FOLATE, FERRITIN, TIBC, IRON, RETICCTPCT in the last 72 hours. Urine analysis:    Component Value Date/Time   COLORURINE YELLOW 01/13/2019 1822   APPEARANCEUR CLEAR 01/13/2019 1822   LABSPEC 1.020 01/13/2019 1822   PHURINE 6.0 01/13/2019 1822   GLUCOSEU NEGATIVE 01/13/2019 1822   HGBUR NEGATIVE 01/13/2019 1822   BILIRUBINUR NEGATIVE 01/13/2019 1822   KETONESUR 80 (A) 01/13/2019 1822   PROTEINUR 30 (A) 01/13/2019 1822   UROBILINOGEN 0.2 11/11/2010 0327   NITRITE POSITIVE (A) 01/13/2019 1822   LEUKOCYTESUR NEGATIVE 01/13/2019 1822   Recent Results (from the past 240 hour(s))  SARS CORONAVIRUS 2 (TAT 6-24 HRS) Nasopharyngeal Nasopharyngeal Swab     Status: None   Collection Time: 04/30/21  5:47 AM   Specimen: Nasopharyngeal Swab  Result Value Ref Range Status   SARS Coronavirus 2 NEGATIVE NEGATIVE Final    Comment: (NOTE) SARS-CoV-2 target  nucleic acids are NOT DETECTED.  The SARS-CoV-2 RNA is generally detectable in upper and lower respiratory specimens during the acute phase of infection. Negative results do not preclude SARS-CoV-2 infection, do not rule out co-infections with other pathogens, and should not be used as the sole basis for treatment or other patient management decisions. Negative results must be combined with clinical observations, patient history, and epidemiological information. The expected result is Negative.  Fact Sheet for Patients: SugarRoll.be  Fact Sheet for Healthcare Providers: https://www.woods-mathews.com/  This test is not yet approved or cleared by the [place] FDA and  has been authorized for detection and/or diagnosis of SARS-CoV-2 by FDA under an Emergency Use Authorization (EUA). This EUA will remain  in effect (meaning this test can be used) for  the duration of the COVID-19 declaration under Se ction 564(b)(1) of the Act, 21 U.S.C. section 360bbb-3(b)(1), unless the authorization is terminated or revoked sooner.  Performed at [facility], [place] [address], [place], [place] [address]       Radiology Studies: No results found.  Scheduled Meds: . morphine CONCENTRATE  5 mg Sublingual Q6H   And  . prochlorperazine  10 mg Intravenous Q6H  . nicotine  21 mg Transdermal Daily  . sodium chloride flush  3 mL Intravenous Q12H  . sucralfate  1 g Oral TID WC & HS   Continuous Infusions:   LOS: 3 days   Time spent: 25 minutes.  Patrecia Pour, MD [facility] [email or web address] 05/03/2021, 10:48 AM

## 2021-05-04 DIAGNOSIS — D649 Anemia, unspecified: Secondary | ICD-10-CM | POA: Diagnosis not present

## 2021-05-04 DIAGNOSIS — C9200 Acute myeloblastic leukemia, not having achieved remission: Secondary | ICD-10-CM | POA: Diagnosis not present

## 2021-05-04 DIAGNOSIS — E876 Hypokalemia: Secondary | ICD-10-CM | POA: Diagnosis not present

## 2021-05-04 NOTE — Progress Notes (Signed)
Report given to [place] at the facility.

## 2021-05-04 NOTE — Progress Notes (Signed)
[facility]) Hospital Liaison RN note  [facility] is able to offer a room today. Consents completed with pt at bedside via [place].   Please send completed and signed DNR with pt at discharge.   RN please call report to [phone].   Updated pt's son, [name], Dr. Bonner Puna and [facility] Magdalen Spatz, RN.  Please call with any questions or concerns.  Thank you,  B. Loren Racer, [facility] [phone]

## 2021-05-04 NOTE — Plan of Care (Signed)
  Problem: Education: Goal: Knowledge of General Education information will improve Description: Including pain rating scale, medication(s)/side effects and non-pharmacologic comfort measures Outcome: Progressing   Problem: Health Behavior/Discharge Planning: Goal: Ability to manage health-related needs will improve Outcome: Progressing   Problem: Clinical Measurements: Goal: Ability to maintain clinical measurements within normal limits will improve Outcome: Progressing Goal: Will remain free from infection Outcome: Progressing Goal: Diagnostic test results will improve Outcome: Progressing Goal: Respiratory complications will improve Outcome: Progressing Goal: Cardiovascular complication will be avoided Outcome: Progressing   Problem: Activity: Goal: Risk for activity intolerance will decrease Outcome: Progressing   Problem: Nutrition: Goal: Adequate nutrition will be maintained Outcome: Progressing   Problem: Coping: Goal: Level of anxiety will decrease Outcome: Progressing   Problem: Elimination: Goal: Will not experience complications related to bowel motility Outcome: Progressing Goal: Will not experience complications related to urinary retention Outcome: Progressing   Problem: Pain Managment: Goal: General experience of comfort will improve Outcome: Progressing   Problem: Safety: Goal: Ability to remain free from injury will improve Outcome: Progressing   Problem: Skin Integrity: Goal: Risk for impaired skin integrity will decrease Outcome: Progressing   Problem: Nutrition Goal: Patient maintains adequate hydration Outcome: Progressing Goal: Patient maintains weight Outcome: Progressing Goal: Patient/Family demonstrates understanding of diet Outcome: Progressing Goal: Patient/Family independently completes tube feeding Outcome: Progressing Goal: Patient will have no more than 5 lb weight change during LOS Outcome: Progressing Goal: Patient will  utilize adaptive techniques to administer nutrition Outcome: Progressing Goal: Patient will verbalize dietary restrictions Outcome: Progressing   

## 2021-05-04 NOTE — Progress Notes (Signed)
Wendy Edwards [place] to be D/C'd  per MD order. Discussed with the patient and all questions fully answered.  VSS, Skin clean, dry and intact without evidence of skin break down, no evidence of skin tears noted.  IV catheter discontinued intact. Site without signs and symptoms of complications. Dressing and pressure applied.  An After Visit Summary was printed and given to the patient. Patient received prescription.  D/c education completed with patient/family including follow up instructions, medication list, d/c activities limitations if indicated, with other d/c instructions as indicated by MD - patient able to verbalize understanding, all questions fully answered.   Patient instructed to return to ED, call 911, or call MD for any changes in condition.   Patient to be escorted via [place], and D/C home via private auto.

## 2021-05-04 NOTE — Discharge Summary (Signed)
Physician Discharge Summary  Wendy Edwards TDV:[id number] DOB: 28-Mar-1966 DOA: 04/30/2021  PCP: Patient, No Pcp Per (Inactive)  Admit date: 04/30/2021 Discharge date: 05/04/2021  Admitted From: Home Disposition: Residential Hospice   Recommendations for Outpatient Follow-up:  Comfort measures  Discharge Condition: Stable for transfer CODE STATUS: DNR Diet recommendation: As desired  Brief/Interim Summary: Wendy Edwards is a 55 y.o. female with a history of aplastic anemia for 20 years, alcohol abuse and hepatic cirrhosis with persistent anemia and thrombocytopenia and recent diagnosis of AML (BM Bx Dec 2021) who did not keep oncology appointments, was said not to be a candidate for typical chemotherapy. She presented to the ED with chest pain found to have WBC 26.3k, hgb 2g/dl, platelets 18k, potassium 2.4, albumin 2.3. Oncology confirmed the patient not to be a candidate for chemotherapy, recommended hospice care. PRBCs and FFP given and patient was admitted with palliative care consultation. The patient has expressed the desire to proceed with comfort measures only and transfer to [facility].   Discharge Diagnoses:  Principal Problem:   Acute myeloid leukemia ([place]) Active Problems:   Tobacco abuse   Thrombocytopenia (HCC)   Transaminitis   Anemia   Hypokalemia   Hypocalcemia   Pressure injury of skin  AML with chronic severe progressive anemia, thrombocytopenia, leukocytosis: No bleeding, negative FOBT.  - Hgb 2 > 9.4 s/p 4u PRBCs.  - No further lab checks or transfusion per [place] discussions.   Leukocytosis: Afebrile without nidus for infection identified. This is due to AML. No further investigation planned.  Chest pain with demand myocardial ischemia/troponin elevation: Improved with transfusions.  - Will forgo echocardiogram or further workup based on [place] discussions, though high output CHF is very likely with progressive profound anemia and cardiomegaly on CXR.    Alcohol abuse: No evidence of withdrawal at this time.   Tobacco use:  - Nicotine patch.   COPD: Continue prn albuterol. Not taking any other controller medications, no wheezing.   Prolonged QT interval: No longer checking.   Hypokalemia, hypocalcemia: No longer checking.   GERD: Can continue treating if pt wishes. No current symptoms.  Discharge Instructions  Allergies as of 05/04/2021      Reactions   Bee Venom Hives   Phenergan [promethazine] Other (See Comments)   "made me shaky"      Medication List    STOP taking these medications   folic acid 1 MG tablet Commonly known as: FOLVITE   mometasone-formoterol 100-5 MCG/ACT Aero Commonly known as: DULERA   multivitamin with minerals Tabs tablet   pantoprazole 40 MG tablet Commonly known as: PROTONIX   thiamine 100 MG tablet   umeclidinium bromide 62.5 MCG/INH Aepb Commonly known as: INCRUSE ELLIPTA   vitamin B-12 1000 MCG tablet Commonly known as: CYANOCOBALAMIN     TAKE these medications   acetaminophen 500 MG tablet Commonly known as: TYLENOL Take 1,000 mg by mouth every 6 (six) hours as needed for mild pain.   B-D SYRINGE/NEEDLE 1CC/25GX5/8 25G X 5/8" 1 ML Misc Generic drug: SYRINGE/NEEDLE (DISP) 1 ML 1,000 mcg by Does not apply route daily. 1cc BD syringe for vitamin B12 shots.   ProAir HFA 108 (90 Base) MCG/ACT inhaler Generic drug: albuterol INHALE 2 PUFFS BY MOUTH EVERY 6 HOURS AS NEEDED What changed:   how much to take  reasons to take this       Allergies  Allergen Reactions  . Bee Venom Hives  . Phenergan [Promethazine] Other (See Comments)    "  made me shaky"    Consultations:  Oncology  Procedures/Studies: DG Chest 2 View  Result Date: 04/30/2021 CLINICAL DATA:  55 year old female with chest pain. EXAM: CHEST - 2 VIEW COMPARISON:  Chest radiograph dated 11/29/2020. FINDINGS: No focal consolidation, pleural effusion, or pneumothorax. Mild cardiomegaly. No acute osseous  pathology. IMPRESSION: No active cardiopulmonary disease. Electronically Signed   By: Anner Crete M.D.   On: 04/30/2021 01:31    Palliative  Subjective: Pain controlled, no new complaints. Actually wants to eat.   Discharge Exam: Vitals:   05/03/21 0337 05/04/21 0341  BP: 117/71 (!) 112/58  Pulse: (!) 117 (!) 120  Resp: 20 17  Temp: 98.1 F (36.7 C) 98.7 F (37.1 C)  SpO2: 95% 94%   General: Chronically ill-appearing in no distress Cardiovascular: Regular tachycardia Extremities: Warm and dry  Labs: BNP (last 3 results) No results for input(s): BNP in the last 8760 hours. Basic Metabolic Panel: Recent Labs  Lab 04/30/21 0227 04/30/21 0426 04/30/21 0903 05/01/21 0131  NA 132* 132*  --  139  K 2.4* 2.4*  --  2.8*  CL 95* 96*  --  103  CO2 24 23  --  25  GLUCOSE 96 93  --  123*  BUN 8 8  --  9  CREATININE 0.97 1.01*  --  0.94  CALCIUM 5.6* 5.7*  --  8.0*  MG  --   --  0.8*  --   PHOS  --   --  3.7  --    Liver Function Tests: Recent Labs  Lab 04/30/21 0227 04/30/21 0426 05/01/21 0131  AST 37 48* 60*  ALT 14 16 25   ALKPHOS 50 47 78  BILITOT 1.3* 0.8 1.4*  PROT 5.1* 4.9* 5.5*  ALBUMIN 2.4* 2.3* 2.5*   No results for input(s): LIPASE, AMYLASE in the last 168 hours. No results for input(s): AMMONIA in the last 168 hours. CBC: Recent Labs  Lab 04/30/21 0426 04/30/21 1050 04/30/21 2034 05/01/21 0131  WBC 26.3* 21.5*  --  28.2*  NEUTROABS 4.7  --   --   --   HGB 2.0* 4.7* 9.2* 9.4*  HCT 6.6* 14.3* 27.0* 27.6*  MCV 124.5* 102.9*  --  85.4  PLT 18* 20*  --  23*   Cardiac Enzymes: No results for input(s): CKTOTAL, CKMB, CKMBINDEX, TROPONINI in the last 168 hours. BNP: Invalid input(s): POCBNP CBG: No results for input(s): GLUCAP in the last 168 hours. D-Dimer No results for input(s): DDIMER in the last 72 hours. Hgb A1c No results for input(s): HGBA1C in the last 72 hours. Lipid Profile No results for input(s): CHOL, HDL, LDLCALC, TRIG,  CHOLHDL, LDLDIRECT in the last 72 hours. Thyroid function studies No results for input(s): TSH, T4TOTAL, T3FREE, THYROIDAB in the last 72 hours.  Invalid input(s): FREET3 Anemia work up No results for input(s): VITAMINB12, FOLATE, FERRITIN, TIBC, IRON, RETICCTPCT in the last 72 hours. Urinalysis    Component Value Date/Time   COLORURINE YELLOW 01/13/2019 1822   APPEARANCEUR CLEAR 01/13/2019 1822   LABSPEC 1.020 01/13/2019 1822   PHURINE 6.0 01/13/2019 1822   GLUCOSEU NEGATIVE 01/13/2019 1822   HGBUR NEGATIVE 01/13/2019 1822   BILIRUBINUR NEGATIVE 01/13/2019 1822   KETONESUR 80 (A) 01/13/2019 1822   PROTEINUR 30 (A) 01/13/2019 1822   UROBILINOGEN 0.2 11/11/2010 0327   NITRITE POSITIVE (A) 01/13/2019 1822   LEUKOCYTESUR NEGATIVE 01/13/2019 1822    Microbiology Recent Results (from the past 240 hour(s))  SARS CORONAVIRUS 2 (TAT 6-24  HRS) Nasopharyngeal Nasopharyngeal Swab     Status: None   Collection Time: 04/30/21  5:47 AM   Specimen: Nasopharyngeal Swab  Result Value Ref Range Status   SARS Coronavirus 2 NEGATIVE NEGATIVE Final    Comment: (NOTE) SARS-CoV-2 target nucleic acids are NOT DETECTED.  The SARS-CoV-2 RNA is generally detectable in upper and lower respiratory specimens during the acute phase of infection. Negative results do not preclude SARS-CoV-2 infection, do not rule out co-infections with other pathogens, and should not be used as the sole basis for treatment or other patient management decisions. Negative results must be combined with clinical observations, patient history, and epidemiological information. The expected result is Negative.  Fact Sheet for Patients: SugarRoll.be  Fact Sheet for Healthcare Providers: https://www.woods-mathews.com/  This test is not yet approved or cleared by the [place] FDA and  has been authorized for detection and/or diagnosis of SARS-CoV-2 by FDA under an Emergency Use  Authorization (EUA). This EUA will remain  in effect (meaning this test can be used) for the duration of the COVID-19 declaration under Se ction 564(b)(1) of the Act, 21 U.S.C. section 360bbb-3(b)(1), unless the authorization is terminated or revoked sooner.  Performed at [facility], [place] [address], [place], [place] [address]     Time coordinating discharge: Approximately 40 minutes  Patrecia Pour, MD  [facility] 05/04/2021, 10:48 AM

## 2021-05-04 NOTE — Plan of Care (Signed)
Problem: Education: Goal: Knowledge of General Education information will improve Description: Including pain rating scale, medication(s)/side effects and non-pharmacologic comfort measures 05/04/2021 1138 by Camillia Herter, RN Outcome: Adequate for Discharge 05/04/2021 1136 by Camillia Herter, RN Outcome: Progressing   Problem: Health Behavior/Discharge Planning: Goal: Ability to manage health-related needs will improve 05/04/2021 1138 by Camillia Herter, RN Outcome: Adequate for Discharge 05/04/2021 1136 by Camillia Herter, RN Outcome: Progressing   Problem: Clinical Measurements: Goal: Ability to maintain clinical measurements within normal limits will improve 05/04/2021 1138 by Camillia Herter, RN Outcome: Adequate for Discharge 05/04/2021 1136 by Camillia Herter, RN Outcome: Progressing Goal: Will remain free from infection 05/04/2021 1138 by Camillia Herter, RN Outcome: Adequate for Discharge 05/04/2021 1136 by Camillia Herter, RN Outcome: Progressing Goal: Diagnostic test results will improve 05/04/2021 1138 by Camillia Herter, RN Outcome: Adequate for Discharge 05/04/2021 1136 by Camillia Herter, RN Outcome: Progressing Goal: Respiratory complications will improve 05/04/2021 1138 by Camillia Herter, RN Outcome: Adequate for Discharge 05/04/2021 1136 by Camillia Herter, RN Outcome: Progressing Goal: Cardiovascular complication will be avoided 05/04/2021 1138 by Camillia Herter, RN Outcome: Adequate for Discharge 05/04/2021 1136 by Camillia Herter, RN Outcome: Progressing   Problem: Activity: Goal: Risk for activity intolerance will decrease 05/04/2021 1138 by Camillia Herter, RN Outcome: Adequate for Discharge 05/04/2021 1136 by Camillia Herter, RN Outcome: Progressing   Problem: Nutrition: Goal: Adequate nutrition will be maintained 05/04/2021 1138 by Camillia Herter, RN Outcome: Adequate for Discharge 05/04/2021 1136 by Camillia Herter, RN Outcome:  Progressing   Problem: Coping: Goal: Level of anxiety will decrease 05/04/2021 1138 by Camillia Herter, RN Outcome: Adequate for Discharge 05/04/2021 1136 by Camillia Herter, RN Outcome: Progressing   Problem: Elimination: Goal: Will not experience complications related to bowel motility 05/04/2021 1138 by Camillia Herter, RN Outcome: Adequate for Discharge 05/04/2021 1136 by Camillia Herter, RN Outcome: Progressing Goal: Will not experience complications related to urinary retention 05/04/2021 1138 by Camillia Herter, RN Outcome: Adequate for Discharge 05/04/2021 1136 by Camillia Herter, RN Outcome: Progressing   Problem: Pain Managment: Goal: General experience of comfort will improve 05/04/2021 1138 by Camillia Herter, RN Outcome: Adequate for Discharge 05/04/2021 1136 by Camillia Herter, RN Outcome: Progressing   Problem: Safety: Goal: Ability to remain free from injury will improve 05/04/2021 1138 by Camillia Herter, RN Outcome: Adequate for Discharge 05/04/2021 1136 by Camillia Herter, RN Outcome: Progressing   Problem: Skin Integrity: Goal: Risk for impaired skin integrity will decrease 05/04/2021 1138 by Camillia Herter, RN Outcome: Adequate for Discharge 05/04/2021 1136 by Camillia Herter, RN Outcome: Progressing   Problem: Nutrition Goal: Patient maintains adequate hydration 05/04/2021 1138 by Camillia Herter, RN Outcome: Adequate for Discharge 05/04/2021 1136 by Camillia Herter, RN Outcome: Progressing Goal: Patient maintains weight 05/04/2021 1138 by Camillia Herter, RN Outcome: Adequate for Discharge 05/04/2021 1136 by Camillia Herter, RN Outcome: Progressing Goal: Patient/Family demonstrates understanding of diet 05/04/2021 1138 by Camillia Herter, RN Outcome: Adequate for Discharge 05/04/2021 1136 by Camillia Herter, RN Outcome: Progressing Goal: Patient/Family independently completes tube feeding 05/04/2021 1138 by Camillia Herter,  RN Outcome: Adequate for Discharge 05/04/2021 1136 by Camillia Herter, RN Outcome: Progressing Goal: Patient will have no more than 5 lb weight change during LOS 05/04/2021 1138 by Camillia Herter, RN Outcome: Adequate for Discharge 05/04/2021 1136 by Larene Pickett,  Almin [place] A, RN Outcome: Progressing Goal: Patient will utilize adaptive techniques to administer nutrition 05/04/2021 1138 by Camillia Herter, RN Outcome: Adequate for Discharge 05/04/2021 1136 by Camillia Herter, RN Outcome: Progressing Goal: Patient will verbalize dietary restrictions 05/04/2021 1138 by Camillia Herter, RN Outcome: Adequate for Discharge 05/04/2021 1136 by Camillia Herter, RN Outcome: Progressing

## 2021-05-04 NOTE — Care Management (Addendum)
Patient has a bed at [facility] , they are requesting transport at noon. Patient aware and in agreement. Called [facility] and left message, patient  has already done consents. [facility] has spoken to [name] also   RN please call report to [phone].  [facility]ure chatted [place].   PTAR called requested noon. PTAR paperwork and DNR in chart.   Magdalen Spatz RN

## 2021-06-18 DEATH — deceased
